# Patient Record
Sex: Female | Born: 1955 | Race: White | Hispanic: No | Marital: Married | Smoking: Current every day smoker
Health system: Southern US, Community
[De-identification: ages and names within clinical notes are randomized; demographics above are authoritative.]

## PROBLEM LIST (undated history)

## (undated) DIAGNOSIS — F419 Anxiety disorder, unspecified: Secondary | ICD-10-CM

## (undated) DIAGNOSIS — C50919 Malignant neoplasm of unspecified site of unspecified female breast: Secondary | ICD-10-CM

## (undated) HISTORY — PX: KIDNEY SURGERY: SHX687

## (undated) HISTORY — PX: FOOT SURGERY: SHX648

## (undated) HISTORY — PX: ABDOMINAL HYSTERECTOMY: SHX81

## (undated) HISTORY — PX: TONSILLECTOMY: SUR1361

---

## 1998-08-11 ENCOUNTER — Emergency Department (HOSPITAL_COMMUNITY): Admission: EM | Admit: 1998-08-11 | Discharge: 1998-08-11 | Payer: Self-pay | Admitting: Emergency Medicine

## 2000-12-22 HISTORY — PX: BREAST BIOPSY: SHX20

## 2000-12-22 HISTORY — PX: BREAST LUMPECTOMY: SHX2

## 2004-10-24 ENCOUNTER — Ambulatory Visit: Payer: Self-pay | Admitting: Oncology

## 2004-11-20 ENCOUNTER — Ambulatory Visit: Payer: Self-pay

## 2004-11-21 ENCOUNTER — Ambulatory Visit: Payer: Self-pay | Admitting: Oncology

## 2004-12-22 HISTORY — PX: BREAST BIOPSY: SHX20

## 2005-04-30 ENCOUNTER — Ambulatory Visit: Payer: Self-pay | Admitting: Oncology

## 2005-05-22 ENCOUNTER — Ambulatory Visit: Payer: Self-pay | Admitting: Oncology

## 2005-06-25 ENCOUNTER — Ambulatory Visit: Payer: Self-pay | Admitting: Unknown Physician Specialty

## 2005-06-27 ENCOUNTER — Ambulatory Visit: Payer: Self-pay

## 2005-07-02 ENCOUNTER — Ambulatory Visit: Payer: Self-pay | Admitting: Surgery

## 2005-07-03 ENCOUNTER — Ambulatory Visit: Payer: Self-pay | Admitting: Oncology

## 2005-08-04 ENCOUNTER — Ambulatory Visit (HOSPITAL_COMMUNITY): Admission: RE | Admit: 2005-08-04 | Discharge: 2005-08-04 | Payer: Self-pay | Admitting: *Deleted

## 2005-08-06 ENCOUNTER — Ambulatory Visit: Payer: Self-pay

## 2005-08-08 ENCOUNTER — Encounter: Admission: RE | Admit: 2005-08-08 | Discharge: 2005-08-08 | Payer: Self-pay | Admitting: *Deleted

## 2005-08-18 ENCOUNTER — Encounter: Admission: RE | Admit: 2005-08-18 | Discharge: 2005-08-18 | Payer: Self-pay | Admitting: *Deleted

## 2006-04-22 ENCOUNTER — Encounter: Payer: Self-pay | Admitting: Emergency Medicine

## 2007-03-18 IMAGING — US ULTRASOUND RIGHT BREAST
1 series · 17 of 25 positions shown · non-contrast
Comparison: none

REASON FOR EXAM: density  [REDACTED]   Per Dr. Thobo Dikgong request saw patient on
7-10  SEND REPORT TO...
COMMENTS:

[Series 1: ultrasound right breast · 17 of 27 slices shown]
[im 1/27]
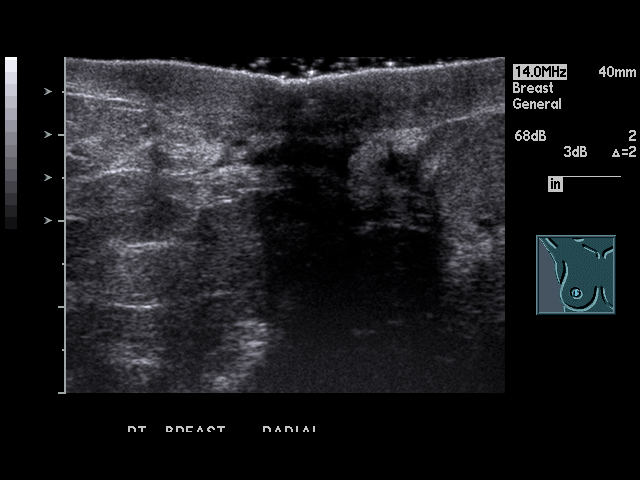
[im 3/27]
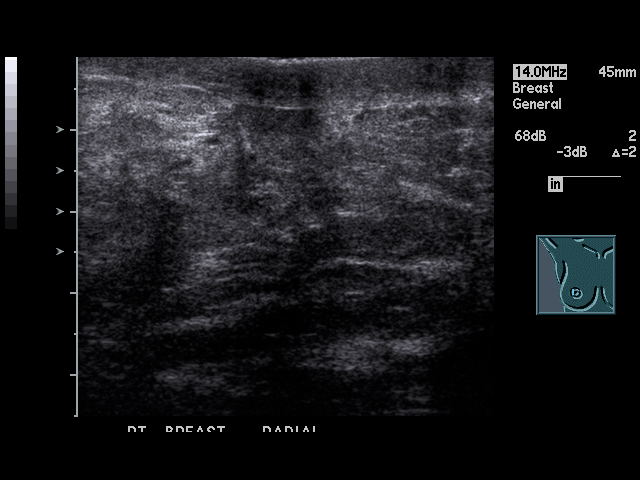
[im 4/27]
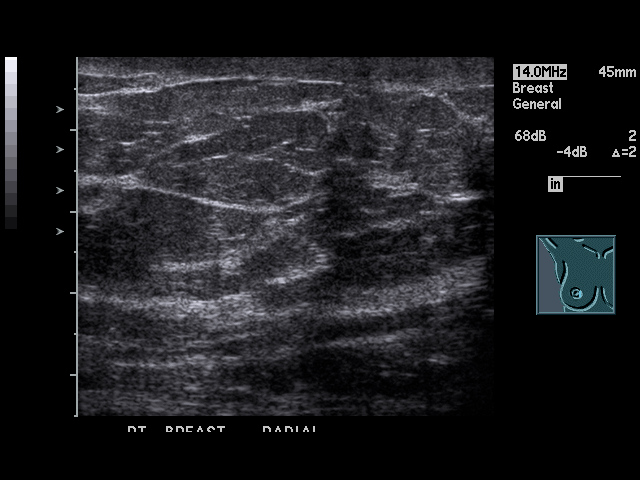
[im 6/27]
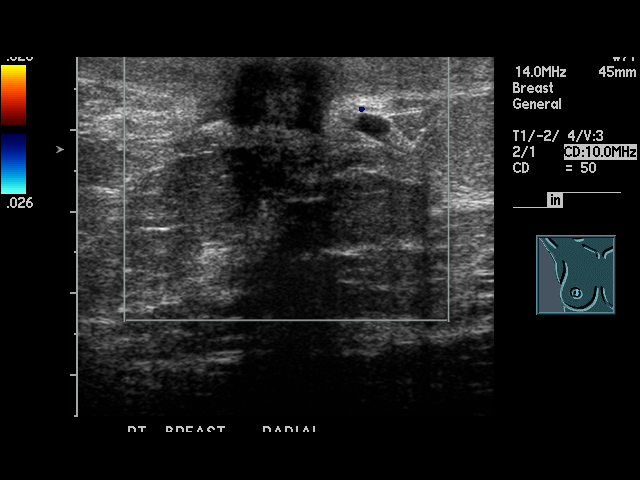
[im 7/27]
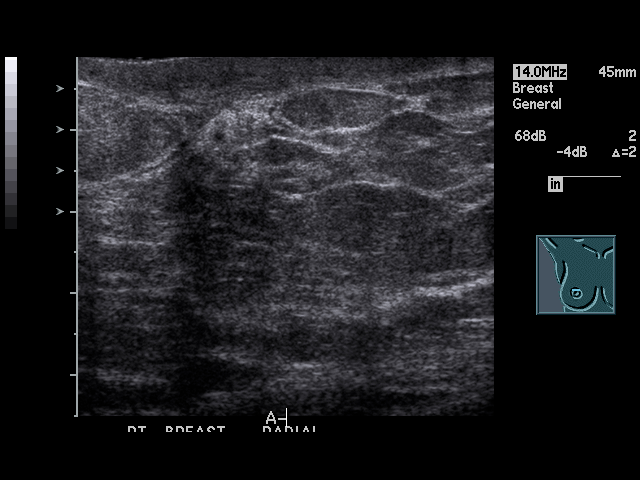
[im 9/27]
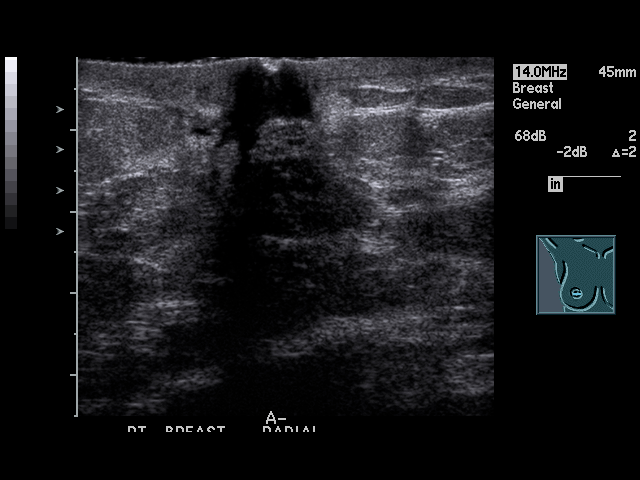
[im 10/27]
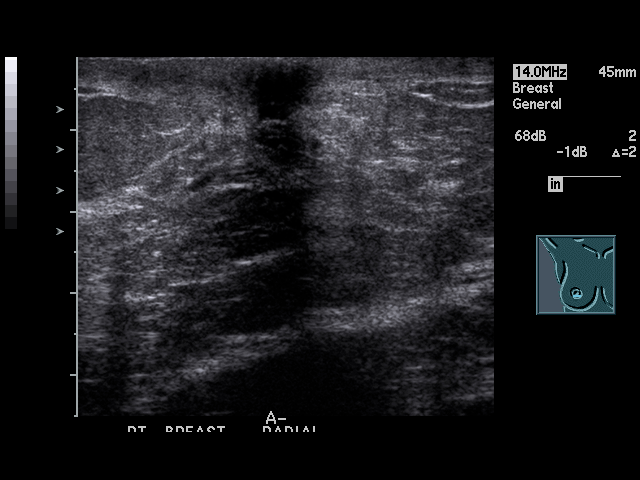
[im 12/27]
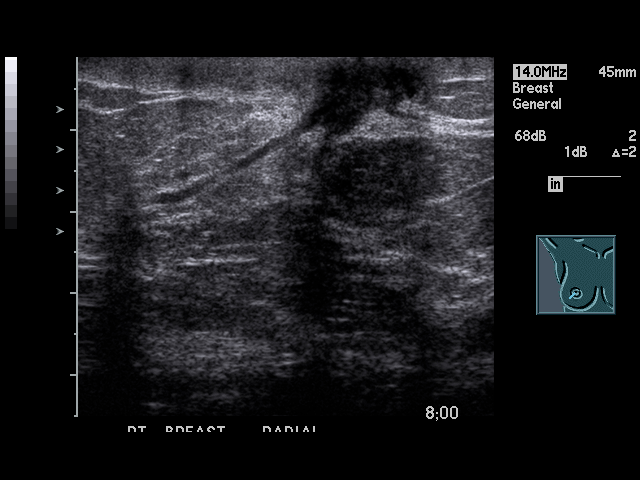
[im 14/27]
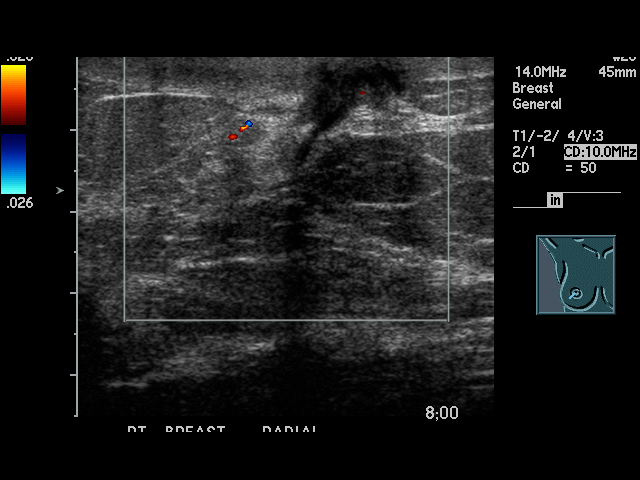
[im 15/27]
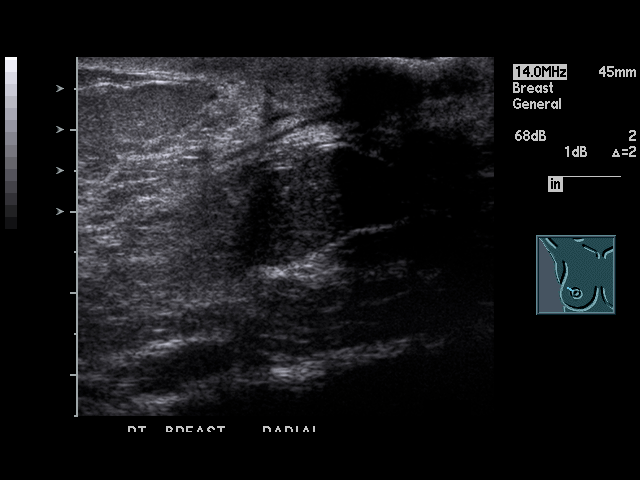
[im 17/27]
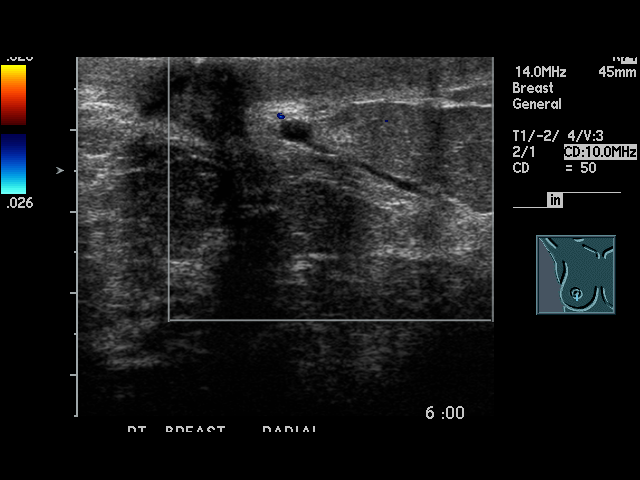
[im 18/27]
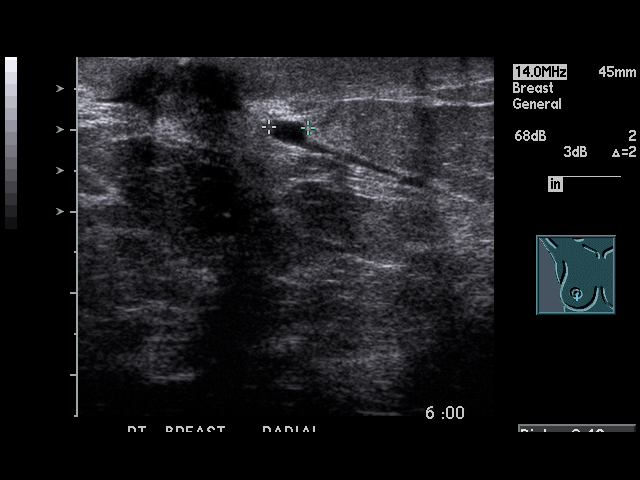
[im 20/27]
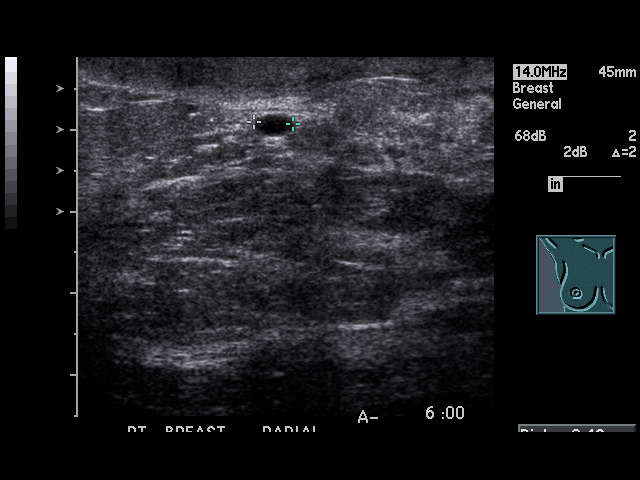
[im 21/27]
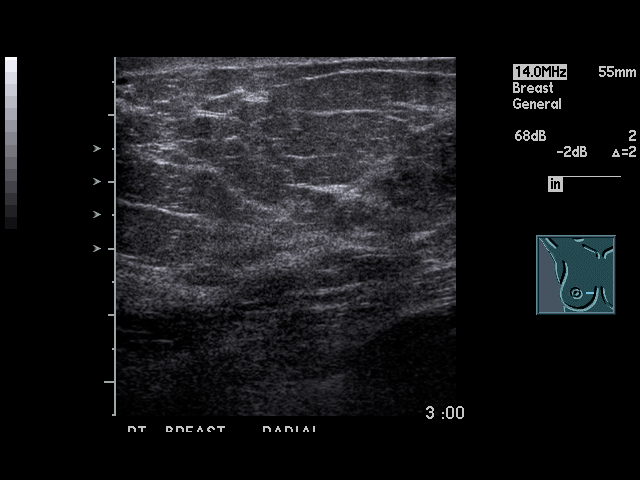
[im 23/27]
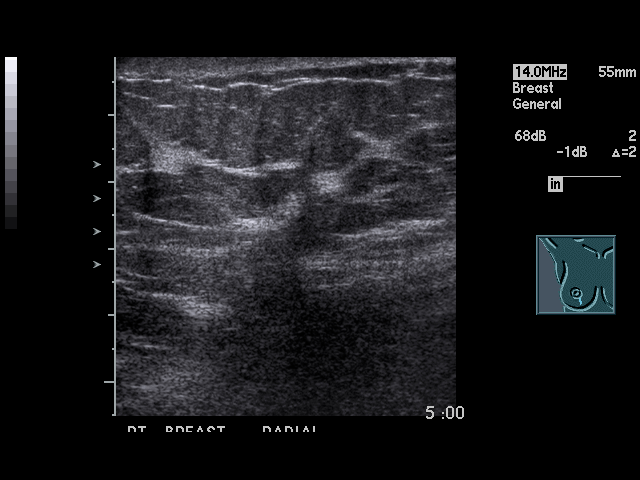
[im 24/27]
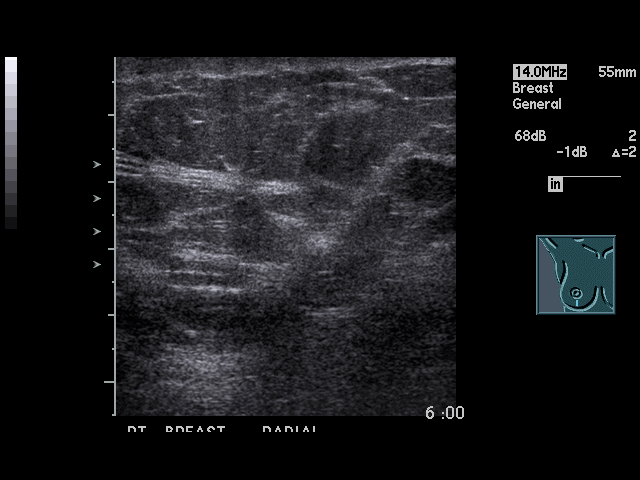
[im 27/27]
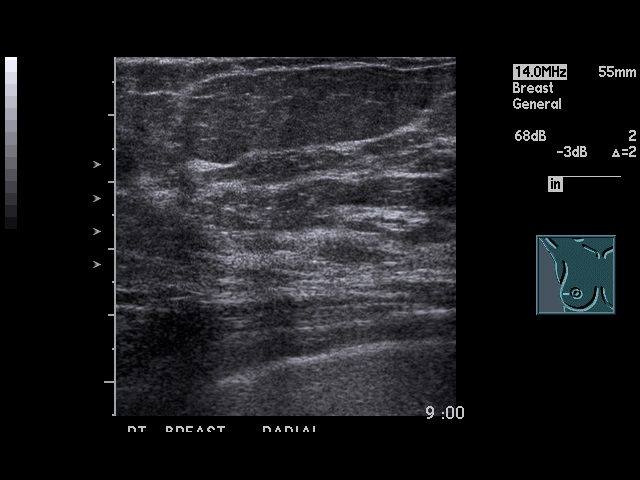

[17 of 25 positions shown; findings below may reference images not displayed]

PROCEDURE:     US  - US BREAST RIGHT  - July 02, 2005  [DATE]

RESULT:        Evaluation of the RIGHT breast in the areolar region
demonstrates multiple dilated ducts extending to the region of the nipple.
A focal region of cystic-like ductal dilatation is appreciated at the 6
o'clock position of the nipple measuring approximately .5 cm in diameter.
No definitive sonographic masses nor nodules are identified.  Considering
the reported patient's history of peau d'[REDACTED] skin in the nipple area and
retraction of the nipple.   Further evaluation with surgical consultation is
recommended.
IMPRESSION: 1.     Suspicious findings within the areolar region of the RIGHT breast.
Considering the patient's history, surgical consultation is recommended.
2.     BI-RADS: Category 4-Suspicious Abnormality.
3.     No definitive masses or nodules are appreciated though dilated ducts
are identified extending to the nipple region.

## 2009-02-07 ENCOUNTER — Ambulatory Visit: Payer: Self-pay

## 2012-01-27 ENCOUNTER — Ambulatory Visit: Payer: Self-pay

## 2013-07-15 DIAGNOSIS — G5750 Tarsal tunnel syndrome, unspecified lower limb: Secondary | ICD-10-CM | POA: Insufficient documentation

## 2013-07-15 DIAGNOSIS — M2060 Acquired deformities of toe(s), unspecified, unspecified foot: Secondary | ICD-10-CM | POA: Insufficient documentation

## 2013-10-12 ENCOUNTER — Ambulatory Visit: Payer: Self-pay | Admitting: Oncology

## 2013-10-14 DIAGNOSIS — Z720 Tobacco use: Secondary | ICD-10-CM | POA: Insufficient documentation

## 2013-10-14 DIAGNOSIS — F17210 Nicotine dependence, cigarettes, uncomplicated: Secondary | ICD-10-CM | POA: Insufficient documentation

## 2013-10-25 ENCOUNTER — Ambulatory Visit: Payer: Self-pay

## 2013-12-17 ENCOUNTER — Emergency Department: Payer: Self-pay | Admitting: Emergency Medicine

## 2013-12-17 LAB — CBC WITH DIFFERENTIAL/PLATELET
Eosinophil #: 0 10*3/uL (ref 0.0–0.7)
Eosinophil %: 0.7 %
HCT: 42.2 % (ref 35.0–47.0)
HGB: 14.1 g/dL (ref 12.0–16.0)
Lymphocyte #: 1.4 10*3/uL (ref 1.0–3.6)
MCH: 32.2 pg (ref 26.0–34.0)
MCHC: 33.5 g/dL (ref 32.0–36.0)
Monocyte #: 0.3 x10 3/mm (ref 0.2–0.9)
Monocyte %: 4.6 %
Neutrophil %: 69.7 %
WBC: 5.6 10*3/uL (ref 3.6–11.0)

## 2013-12-17 LAB — BASIC METABOLIC PANEL
Anion Gap: 5 — ABNORMAL LOW (ref 7–16)
BUN: 11 mg/dL (ref 7–18)
Chloride: 105 mmol/L (ref 98–107)
Co2: 29 mmol/L (ref 21–32)
Creatinine: 0.88 mg/dL (ref 0.60–1.30)
EGFR (African American): >60
Glucose: 128 mg/dL — ABNORMAL HIGH (ref 65–99)
Potassium: 3.8 mmol/L (ref 3.5–5.1)
Sodium: 139 mmol/L (ref 136–145)

## 2014-04-12 ENCOUNTER — Emergency Department: Payer: Self-pay | Admitting: Internal Medicine

## 2014-07-14 ENCOUNTER — Ambulatory Visit: Payer: Self-pay | Admitting: Podiatry

## 2014-07-18 ENCOUNTER — Encounter: Payer: Self-pay | Admitting: Podiatry

## 2014-07-18 ENCOUNTER — Ambulatory Visit (INDEPENDENT_AMBULATORY_CARE_PROVIDER_SITE_OTHER): Payer: Medicaid Other

## 2014-07-18 ENCOUNTER — Ambulatory Visit (INDEPENDENT_AMBULATORY_CARE_PROVIDER_SITE_OTHER): Payer: Medicaid Other | Admitting: Podiatry

## 2014-07-18 VITALS — BP 146/86 | HR 68 | Resp 12

## 2014-07-18 DIAGNOSIS — R52 Pain, unspecified: Secondary | ICD-10-CM

## 2014-07-18 DIAGNOSIS — G90529 Complex regional pain syndrome I of unspecified lower limb: Secondary | ICD-10-CM

## 2014-07-18 DIAGNOSIS — M779 Enthesopathy, unspecified: Secondary | ICD-10-CM

## 2014-07-18 DIAGNOSIS — M2040 Other hammer toe(s) (acquired), unspecified foot: Secondary | ICD-10-CM

## 2014-07-18 DIAGNOSIS — M7750 Other enthesopathy of unspecified foot: Secondary | ICD-10-CM

## 2014-07-18 NOTE — Progress Notes (Signed)
Subjective:     Patient ID: Hailey Patterson, female   DOB: [birth year], 58 y.o.   MRN: [id number]  Foot Pain  Toe Pain    patient presents stating she had surgery last November and that she's had trouble with her foot ever since left and is changing colors and swelling and very painful and that she has trouble with her right toes   Review of Systems  All other systems reviewed and are negative.      Objective:   Physical Exam  Nursing note and vitals reviewed. Constitutional: She is oriented to person, place, and time.  Cardiovascular: Intact distal pulses.   Musculoskeletal: Normal range of motion.  Neurological: She is oriented to person, place, and time.  Skin: Skin is warm.   neurovascular status intact with muscle strength adequate and range of motion of the subtalar and midtarsal joint within normal limits. Patient is splinting in the forefoot left and there is incision sites on the dorsum left and there is severe pain in the metatarsophalangeal joints with clonus to this part of her foot and some swelling. I do not note any mottled skin formation. The right foot shows mild contraction of the digits mild discomfort but not intense     Assessment:     Very strong possibility for chronic pain syndrome or RSD S.-like symptoms left foot secondary to surgery of 8 months ago    Plan:     H&P and x-rays reviewed and I gave all instructions to what's possible to the patient and she is to return to her primary physician as I do believe this will need to be treated at a medical institution. I wrote down for her information she's making appointment with her surgeon who did her surgery to make months ago

## 2014-07-18 NOTE — Progress Notes (Signed)
   Subjective:    Patient ID: Hailey Patterson, female    DOB: [birth year], 58 y.o.   MRN: [id number]  HPI PT STATED LT FOOT HAVE SURGERY DONE 8 MONTHS AGO AND STILL HAVING PAIN/BURNING. THE FOOT IS GETTING WORSE. ALSO RT FOOT TOES ARE CURVING IN AND IS GETTING WORSE. RT FOOT TOES GETTING AGGRAVATED BY WEARING CERTAIN SHOES. BOTH FEET TRIED TO TAKE TYLENOL BUT NO HELP.   Review of Systems  Eyes: Positive for redness and visual disturbance.  Musculoskeletal: Positive for back pain, gait problem and joint swelling.       Objective:   Physical Exam        Assessment & Plan:

## 2014-07-23 ENCOUNTER — Emergency Department: Payer: Self-pay | Admitting: Emergency Medicine

## 2014-07-23 LAB — CBC
HCT: 43.3 % (ref 35.0–47.0)
HGB: 14.4 g/dL (ref 12.0–16.0)
MCH: 32.2 pg (ref 26.0–34.0)
MCHC: 33.3 g/dL (ref 32.0–36.0)
MCV: 97 fL (ref 80–100)
PLATELETS: 199 10*3/uL (ref 150–440)
RBC: 4.47 10*6/uL (ref 3.80–5.20)
RDW: 14 % (ref 11.5–14.5)
WBC: 6.6 10*3/uL (ref 3.6–11.0)

## 2014-07-23 LAB — BASIC METABOLIC PANEL
Anion Gap: 6 — ABNORMAL LOW (ref 7–16)
BUN: 12 mg/dL (ref 7–18)
Calcium, Total: 8.9 mg/dL (ref 8.5–10.1)
Chloride: 108 mmol/L — ABNORMAL HIGH (ref 98–107)
Co2: 26 mmol/L (ref 21–32)
Creatinine: 1.02 mg/dL (ref 0.60–1.30)
EGFR (Non-African Amer.): 60 — ABNORMAL LOW
Glucose: 101 mg/dL — ABNORMAL HIGH (ref 65–99)
Osmolality: 279 (ref 275–301)
POTASSIUM: 3.9 mmol/L (ref 3.5–5.1)
SODIUM: 140 mmol/L (ref 136–145)

## 2014-07-23 LAB — TROPONIN I: Troponin-I: <0.02 ng/mL

## 2014-07-25 ENCOUNTER — Encounter (HOSPITAL_COMMUNITY): Payer: Self-pay | Admitting: Emergency Medicine

## 2014-07-25 ENCOUNTER — Other Ambulatory Visit: Payer: Self-pay

## 2014-07-25 DIAGNOSIS — Z853 Personal history of malignant neoplasm of breast: Secondary | ICD-10-CM | POA: Insufficient documentation

## 2014-07-25 DIAGNOSIS — R079 Chest pain, unspecified: Secondary | ICD-10-CM | POA: Insufficient documentation

## 2014-07-25 DIAGNOSIS — M79609 Pain in unspecified limb: Secondary | ICD-10-CM | POA: Diagnosis present

## 2014-07-25 DIAGNOSIS — Z90710 Acquired absence of both cervix and uterus: Secondary | ICD-10-CM | POA: Diagnosis not present

## 2014-07-25 DIAGNOSIS — R0789 Other chest pain: Secondary | ICD-10-CM | POA: Diagnosis not present

## 2014-07-25 DIAGNOSIS — Z79899 Other long term (current) drug therapy: Secondary | ICD-10-CM | POA: Insufficient documentation

## 2014-07-25 DIAGNOSIS — M7989 Other specified soft tissue disorders: Secondary | ICD-10-CM | POA: Diagnosis not present

## 2014-07-25 DIAGNOSIS — F17200 Nicotine dependence, unspecified, uncomplicated: Secondary | ICD-10-CM | POA: Diagnosis not present

## 2014-07-25 NOTE — ED Notes (Signed)
Pt states pain that is majority on her right arm wit swelling, pt states that the pain sometimes feels like numbness and it sometimes hurts due to it being swollen. Pt states that her chest also hurts at times but majority of the pain is in her right arm.

## 2014-07-26 ENCOUNTER — Ambulatory Visit (HOSPITAL_COMMUNITY)
Admission: RE | Admit: 2014-07-26 | Discharge: 2014-07-26 | Disposition: A | Discharge status: Home / Self Care | Payer: Medicaid Other | Source: Ambulatory Visit

## 2014-07-26 ENCOUNTER — Emergency Department (HOSPITAL_COMMUNITY)
Admission: EM | Admit: 2014-07-26 | Discharge: 2014-07-26 | Disposition: A | Discharge status: Home / Self Care | Payer: Medicaid Other | Attending: Emergency Medicine | Admitting: Emergency Medicine

## 2014-07-26 ENCOUNTER — Emergency Department (HOSPITAL_COMMUNITY): Payer: Medicaid Other

## 2014-07-26 DIAGNOSIS — M7989 Other specified soft tissue disorders: Secondary | ICD-10-CM

## 2014-07-26 DIAGNOSIS — R079 Chest pain, unspecified: Secondary | ICD-10-CM

## 2014-07-26 DIAGNOSIS — M79609 Pain in unspecified limb: Secondary | ICD-10-CM

## 2014-07-26 HISTORY — DX: Malignant neoplasm of unspecified site of unspecified female breast: C50.919

## 2014-07-26 LAB — I-STAT CHEM 8, ED
BUN: 10 mg/dL (ref 6–23)
CHLORIDE: 104 meq/L (ref 96–112)
Calcium, Ion: 1.17 mmol/L (ref 1.13–1.30)
Creatinine, Ser: 1.2 mg/dL — ABNORMAL HIGH (ref 0.50–1.10)
GLUCOSE: 94 mg/dL (ref 70–99)
HCT: 42 % (ref 36.0–46.0)
Hemoglobin: 14.3 g/dL (ref 12.0–15.0)
Potassium: 3.7 mEq/L (ref 3.7–5.3)
SODIUM: 142 meq/L (ref 137–147)
TCO2: 27 mmol/L (ref 0–100)

## 2014-07-26 LAB — CBC WITH DIFFERENTIAL/PLATELET
Basophils Absolute: 0 10*3/uL (ref 0.0–0.1)
Basophils Relative: 0 % (ref 0–1)
Eosinophils Absolute: 0.2 10*3/uL (ref 0.0–0.7)
Eosinophils Relative: 2 % (ref 0–5)
HCT: 43.5 % (ref 36.0–46.0)
HEMOGLOBIN: 14.8 g/dL (ref 12.0–15.0)
LYMPHS ABS: 3 10*3/uL (ref 0.7–4.0)
Lymphocytes Relative: 47 % — ABNORMAL HIGH (ref 12–46)
MCH: 32.9 pg (ref 26.0–34.0)
MCHC: 34 g/dL (ref 30.0–36.0)
MCV: 96.7 fL (ref 78.0–100.0)
MONOS PCT: 8 % (ref 3–12)
Monocytes Absolute: 0.5 10*3/uL (ref 0.1–1.0)
NEUTROS ABS: 2.8 10*3/uL (ref 1.7–7.7)
Neutrophils Relative %: 43 % (ref 43–77)
PLATELETS: 212 10*3/uL (ref 150–400)
RBC: 4.5 MIL/uL (ref 3.87–5.11)
RDW: 13.5 % (ref 11.5–15.5)
WBC: 6.4 10*3/uL (ref 4.0–10.5)

## 2014-07-26 LAB — I-STAT TROPONIN, ED: TROPONIN I, POC: 0 ng/mL (ref 0.00–0.08)

## 2014-07-26 LAB — D-DIMER, QUANTITATIVE (NOT AT ARMC): D DIMER QUANT: 0.61 ug{FEU}/mL — AB (ref 0.00–0.48)

## 2014-07-26 MED ORDER — ENOXAPARIN SODIUM 80 MG/0.8ML ~~LOC~~ SOLN: 65.0000 mg | Freq: Two times a day (BID) | SUBCUTANEOUS | Status: DC

## 2014-07-26 MED ORDER — ENOXAPARIN SODIUM 80 MG/0.8ML ~~LOC~~ SOLN
65.0000 mg | Freq: Once | SUBCUTANEOUS | Status: AC
  Administered 2014-07-26: 65 mg via SUBCUTANEOUS
  Filled 2014-07-26: qty 0.8

## 2014-07-26 NOTE — Progress Notes (Signed)
Right upper extremity venous duplex completed:  No evidence of DVT or superficial thrombosis.    

## 2014-07-26 NOTE — ED Notes (Signed)
Patient transported to X-ray 

## 2014-07-26 NOTE — Progress Notes (Signed)
ANTICOAGULATION CONSULT NOTE - Initial Consult  Pharmacy Consult for Lovenox  Indication: r/o VTE  Allergies  Allergen Reactions  . Codeine Nausea And Vomiting  . Meperidine Nausea And Vomiting  . Morphine Nausea And Vomiting    Patient Measurements: ~64 kg per pt  Vital Signs: Temp: 97.9 F (36.6 C) (08/04 2135) Temp src: Oral (08/04 2135) BP: 141/84 mmHg (08/05 0143) Pulse Rate: 75 (08/04 2336)  Labs:  Recent Labs  07/26/14 0052 07/26/14 0255  HGB 14.8 14.3  HCT 43.5 42.0  PLT 212  --   CREATININE  --  1.20*    CrCl is unknown because there is no height on file for the current visit.   Medical History: Past Medical History  Diagnosis Date  . Breast cancer     Assessment: 58 y/o with arm swelling, intermittent chest pain, CBC good, renal function ok, to start Lovenox for r/o VTE.   Goal of Therapy:  Monitor platelets by anticoagulation protocol: Yes   Plan:  -Lovenox 1 mg/kg subcutaneous q12h -Minimum q72h CBC while on Lovenox  -Monitor for bleeding -F/U VTE w/u  Narda Bonds 07/26/2014,2:59 AM

## 2014-07-26 NOTE — Discharge Instructions (Signed)
Return in the morning to have an ultrasound of your right arm completed. Recommend you followup with your primary care doctor. Continue with your stress test as recommended by [facility]. Return to the emergency department as needed if symptoms worsen.  Muscle Pain Muscle pain (myalgia) may be caused by many things, including:  Overuse or muscle strain, especially if you are not in shape. This is the most common cause of muscle pain.  Injury.  Bruises.  Viruses, such as the flu.  Infectious diseases.  Fibromyalgia, which is a chronic condition that causes muscle tenderness, fatigue, and headache.  Autoimmune diseases, including lupus.  Certain drugs, including ACE inhibitors and statins. Muscle pain may be mild or severe. In most cases, the pain lasts only a short time and goes away without treatment. To diagnose the cause of your muscle pain, your health care provider will take your medical history. This means he or she will ask you when your muscle pain began and what has been happening. If you have not had muscle pain for very long, your health care provider may want to wait before doing much testing. If your muscle pain has lasted a long time, your health care provider may want to run tests right away. If your health care provider thinks your muscle pain may be caused by illness, you may need to have additional tests to rule out certain conditions.  Treatment for muscle pain depends on the cause. Home care is often enough to relieve muscle pain. Your health care provider may also prescribe anti-inflammatory medicine. HOME CARE INSTRUCTIONS Watch your condition for any changes. The following actions may help to lessen any discomfort you are feeling:  Only take over-the-counter or prescription medicines as directed by your health care provider.  Apply ice to the sore muscle:  Put ice in a plastic bag.  Place a towel between your skin and the bag.  Leave the ice on for 15-20 minutes, 3-4  times a day.  You may alternate applying hot and cold packs to the muscle as directed by your health care provider.  If overuse is causing your muscle pain, slow down your activities until the pain goes away.  Remember that it is normal to feel some muscle pain after starting a workout program. Muscles that have not been used often will be sore at first.  Do regular, gentle exercises if you are not usually active.  Warm up before exercising to lower your risk of muscle pain.  Do not continue working out if the pain is very bad. Bad pain could mean you have injured a muscle. SEEK MEDICAL CARE IF:  Your muscle pain gets worse, and medicines do not help.  You have muscle pain that lasts longer than 3 days.  You have a rash or fever along with muscle pain.  You have muscle pain after a tick bite.  You have muscle pain while working out, even though you are in good physical condition.  You have redness, soreness, or swelling along with muscle pain.  You have muscle pain after starting a new medicine or changing the dose of a medicine. SEEK IMMEDIATE MEDICAL CARE IF:  You have trouble breathing.  You have trouble swallowing.  You have muscle pain along with a stiff neck, fever, and vomiting.  You have severe muscle weakness or cannot move part of your body. MAKE SURE YOU:   Understand these instructions.  Will watch your condition.  Will get help right away if you are not  doing well or get worse. Document Released: 10/30/2006 Document Revised: 12/13/2013 Document Reviewed: 10/04/2013 [facility] Patient Information 2015 [place], [place]. This information is not intended to replace advice given to you by your health care provider. Make sure you discuss any questions you have with your health care provider.

## 2014-08-08 NOTE — ED Provider Notes (Signed)
CSN: [id number]     Arrival date & time 07/25/14  2126 History   First MD Initiated Contact with Patient 07/26/14 0111     Chief Complaint  Patient presents with  . Arm Pain    (Consider location/radiation/quality/duration/timing/severity/associated sxs/prior Treatment) HPI Comments: Patient is a 58 year old female with a history of breast cancer who presents to the emergency department for swelling of her proximal right arm. Patient states that she feels as though her arm appear swollen on the volar aspect of her right arm, just proximal to her elbow. Patient states that swelling is associated with an aching discomfort. She states that sometimes she notices associated numbness in her right arm. Sometimes the pain radiates to her right chest. She states that the swelling improves spontaneously when upright. Patient denies fever, syncope, shortness of breath, nausea, vomiting, extremity weakness, and trauma or injury. Patient denies history of blood clots. She is cancer free. No personal hx of ACS.  Patient was evaluated at [facility] by cardiology for this complaint today at which time she was informed to have an outpatient stress test completed.  Patient is a 58 y.o. female presenting with arm pain. The history is provided by the patient. No language interpreter was used.  Arm Pain Associated symptoms include arthralgias, chest pain, joint swelling and numbness. Pertinent negatives include no fever, nausea or vomiting.    Past Medical History  Diagnosis Date  . Breast cancer    Past Surgical History  Procedure Laterality Date  . Abdominal hysterectomy    . Kidney surgery    . Tonsillectomy    . Cesarean section    . Foot surgery      bilateral feet   History reviewed. No pertinent family history. History  Substance Use Topics  . Smoking status: Light Tobacco Smoker  . Smokeless tobacco: Not on file  . Alcohol Use: No   OB History   Grav Para Term Preterm Abortions TAB SAB Ect Mult  Living                  Review of Systems  Constitutional: Negative for fever.  Respiratory: Negative for shortness of breath.   Cardiovascular: Positive for chest pain.  Gastrointestinal: Negative for nausea and vomiting.  Musculoskeletal: Positive for arthralgias and joint swelling.  Neurological: Positive for numbness. Negative for syncope.  All other systems reviewed and are negative.    Allergies  Codeine; Meperidine; and Morphine  Home Medications   Prior to Admission medications   Medication Sig Start Date End Date Taking? Authorizing Provider  citalopram (CELEXA) 20 MG tablet Take 20 mg by mouth daily.  01/31/13  Yes Historical Provider, MD   BP 141/84  Pulse 75  Temp(Src) 97.9 F (36.6 C) (Oral)  Resp 12  SpO2 100%  Physical Exam  Nursing note and vitals reviewed. Constitutional: She is oriented to person, place, and time. She appears well-developed and well-nourished. No distress.  Nontoxic/nonseptic appearing  HENT:  Head: Normocephalic and atraumatic.  Eyes: Conjunctivae and EOM are normal. No scleral icterus.  Neck: Normal range of motion.  No JVD  Cardiovascular: Normal rate, regular rhythm and intact distal pulses.   Distal radial pulse 2+ in RUE  Pulmonary/Chest: Effort normal. No respiratory distress. She has no wheezes. She has no rales.   No tachypnea or dyspnea. Chest expansion symmetric.  Musculoskeletal: Normal range of motion.  Normal ROM of RUE. No appreciable significant swelling to RUE soft tissue. No erythema, heat to touch, or red streaking.  Neurological: She is alert and oriented to person, place, and time. She exhibits normal muscle tone. Coordination normal.  GCS 15. Speech is goal oriented. No gross sensory deficits appreciated. Strength in upper extremities 5/5 bilaterally.  Skin: Skin is warm and dry. No rash noted. She is not diaphoretic. No erythema. No pallor.  Psychiatric: She has a normal mood and affect. Her behavior is normal.     ED Course  Procedures (including critical care time) Labs Review Labs Reviewed  CBC WITH DIFFERENTIAL - Abnormal; Notable for the following:    Lymphocytes Relative 47 (*)    All other components within normal limits  D-DIMER, QUANTITATIVE - Abnormal; Notable for the following:    D-Dimer, Quant 0.61 (*)    All other components within normal limits  I-STAT CHEM 8, ED - Abnormal; Notable for the following:    Creatinine, Ser 1.20 (*)    All other components within normal limits  I-STAT TROPOININ, ED   Imaging Review Dg Chest 2 View  07/26/2014   CLINICAL DATA:  Chest and right arm pain  EXAM: CHEST  2 VIEW  COMPARISON:  07/23/2014  FINDINGS: Heart size and vascularity are normal. Lungs are clear without infiltrate or effusion. Chronic right rib fractures.  IMPRESSION: No active cardiopulmonary disease.   Electronically Signed   By: Franchot Gallo M.D.   On: 07/26/2014 01:46   Dg Humerus Right  07/26/2014   CLINICAL DATA:  Arm pain  EXAM: RIGHT HUMERUS - 2+ VIEW  COMPARISON:  None.  FINDINGS: There is no evidence of fracture or other focal bone lesions. Soft tissues are unremarkable.  IMPRESSION: Negative.   Electronically Signed   By: Franchot Gallo M.D.   On: 07/26/2014 01:47     EKG Interpretation   Date/Time:  Wednesday July 26 2014 01:02:50 EDT Ventricular Rate:  63 PR Interval:  171 QRS Duration: 83 QT Interval:  420 QTC Calculation: 430 R Axis:   51 Text Interpretation:  Sinus rhythm Confirmed by [facility]  MD, APRIL  ([id number]) on 07/26/2014 2:14:51 AM      MDM   Final diagnoses:  Swelling of arm  Right-sided chest pain    58 year old female presents to the emergency department for further evaluation of subjective right arm swelling and pain. Patient well and nontoxic appearing, hemodynamically stable, and afebrile. Cardiac workup completed today which is negative. Patient denies a hx of ACS and symptoms fairly atypical for this. Also doubt dissection; no  mediastinal widening on CXR and VSS. Patient evaluated by [facility] cardiology today for symptoms. Cardiology deemed patient fit for outpatient stress test for further work up. No evidence of cellulitis or soft tissue skin infection. No marked swelling appreciated to RUE. No pitting edema. Doubt blood clot, however, unable to definitively exclude this diagnosis. D dimer is mildly elevated. Plan includes tx with Lovenox with instruction to return for venous duplex. Patient stable for d/c. Do not believe further emergent workup is indicated. Return precautions discussed and provided. Patient agreeable to plan with no unaddressed concerns.   Filed Vitals:   07/25/14 2135 07/25/14 2336 07/26/14 0143  BP: 138/91 133/85 141/84  Pulse: 79 75   Temp: 97.9 F (36.6 C)    TempSrc: Oral    Resp: 22 22 12   SpO2: 100% 98% 100%     Antonietta Breach, PA-C 08/08/14 2042

## 2014-08-16 NOTE — ED Provider Notes (Signed)
Medical screening examination/treatment/procedure(s) were performed by non-physician practitioner and as supervising physician I was immediately available for consultation/collaboration.   EKG Interpretation   Date/Time:  Wednesday July 26 2014 01:02:50 EDT Ventricular Rate:  63 PR Interval:  171 QRS Duration: 83 QT Interval:  420 QTC Calculation: 430 R Axis:   51 Text Interpretation:  Sinus rhythm Confirmed by [facility]  MD, Brihanna Devenport  ([id number]) on 07/26/2014 2:14:51 AM       Gretel Cantu Alfonso Patten, MD 08/16/14 [id number]

## 2014-08-25 ENCOUNTER — Ambulatory Visit: Payer: Self-pay | Admitting: Physician Assistant

## 2014-08-30 ENCOUNTER — Ambulatory Visit: Payer: Self-pay | Admitting: Physician Assistant

## 2014-09-07 ENCOUNTER — Ambulatory Visit: Payer: Self-pay | Admitting: Oncology

## 2014-09-07 DIAGNOSIS — H269 Unspecified cataract: Secondary | ICD-10-CM | POA: Insufficient documentation

## 2014-09-07 DIAGNOSIS — Z853 Personal history of malignant neoplasm of breast: Secondary | ICD-10-CM | POA: Insufficient documentation

## 2014-09-07 DIAGNOSIS — C50911 Malignant neoplasm of unspecified site of right female breast: Secondary | ICD-10-CM | POA: Insufficient documentation

## 2014-09-08 DIAGNOSIS — M21619 Bunion of unspecified foot: Secondary | ICD-10-CM | POA: Insufficient documentation

## 2014-10-16 ENCOUNTER — Ambulatory Visit: Payer: Self-pay | Admitting: Ophthalmology

## 2014-11-27 ENCOUNTER — Ambulatory Visit: Payer: Self-pay | Admitting: Ophthalmology

## 2015-02-22 DIAGNOSIS — Z91199 Patient's noncompliance with other medical treatment and regimen due to unspecified reason: Secondary | ICD-10-CM | POA: Insufficient documentation

## 2015-02-22 DIAGNOSIS — Z9119 Patient's noncompliance with other medical treatment and regimen: Secondary | ICD-10-CM | POA: Insufficient documentation

## 2015-04-06 DIAGNOSIS — L299 Pruritus, unspecified: Secondary | ICD-10-CM | POA: Insufficient documentation

## 2015-04-06 DIAGNOSIS — N6459 Other signs and symptoms in breast: Secondary | ICD-10-CM | POA: Insufficient documentation

## 2015-05-01 ENCOUNTER — Ambulatory Visit
Admission: RE | Admit: 2015-05-01 | Discharge: 2015-05-01 | Disposition: A | Discharge status: Home / Self Care | Payer: Medicaid Other | Source: Ambulatory Visit | Attending: Gastroenterology | Admitting: Gastroenterology

## 2015-05-01 ENCOUNTER — Ambulatory Visit: Payer: Medicaid Other | Admitting: Anesthesiology

## 2015-05-01 ENCOUNTER — Ambulatory Visit: Admission: RE | Admit: 2015-05-01 | Payer: Medicaid Other | Source: Ambulatory Visit | Admitting: Gastroenterology

## 2015-05-01 ENCOUNTER — Encounter
Admission: RE | Disposition: A | Discharge status: Home / Self Care | Payer: Self-pay | Source: Ambulatory Visit | Attending: Gastroenterology

## 2015-05-01 ENCOUNTER — Encounter: Admission: RE | Payer: Self-pay | Source: Ambulatory Visit

## 2015-05-01 ENCOUNTER — Encounter: Payer: Self-pay | Admitting: *Deleted

## 2015-05-01 DIAGNOSIS — Z885 Allergy status to narcotic agent status: Secondary | ICD-10-CM | POA: Diagnosis not present

## 2015-05-01 DIAGNOSIS — Z800 Family history of malignant neoplasm of digestive organs: Secondary | ICD-10-CM | POA: Insufficient documentation

## 2015-05-01 DIAGNOSIS — Z79899 Other long term (current) drug therapy: Secondary | ICD-10-CM | POA: Diagnosis not present

## 2015-05-01 DIAGNOSIS — Z853 Personal history of malignant neoplasm of breast: Secondary | ICD-10-CM | POA: Diagnosis not present

## 2015-05-01 DIAGNOSIS — Z90710 Acquired absence of both cervix and uterus: Secondary | ICD-10-CM | POA: Diagnosis not present

## 2015-05-01 DIAGNOSIS — K640 First degree hemorrhoids: Secondary | ICD-10-CM | POA: Insufficient documentation

## 2015-05-01 DIAGNOSIS — Z9889 Other specified postprocedural states: Secondary | ICD-10-CM | POA: Diagnosis not present

## 2015-05-01 DIAGNOSIS — F17200 Nicotine dependence, unspecified, uncomplicated: Secondary | ICD-10-CM | POA: Insufficient documentation

## 2015-05-01 DIAGNOSIS — Z1211 Encounter for screening for malignant neoplasm of colon: Secondary | ICD-10-CM | POA: Diagnosis not present

## 2015-05-01 HISTORY — PX: COLONOSCOPY: SHX5424

## 2015-05-01 SURGERY — COLONOSCOPY
Anesthesia: General

## 2015-05-01 MED ORDER — PROPOFOL 10 MG/ML IV BOLUS
INTRAVENOUS | Status: DC | PRN
  Administered 2015-05-01: 50 mg via INTRAVENOUS

## 2015-05-01 MED ORDER — PROPOFOL INFUSION 10 MG/ML OPTIME
INTRAVENOUS | Status: DC | PRN
  Administered 2015-05-01: 120 ug/kg/min via INTRAVENOUS

## 2015-05-01 MED ORDER — SODIUM CHLORIDE 0.9 % IV SOLN
INTRAVENOUS | Status: DC
Start: 2015-05-01 — End: 2015-05-01

## 2015-05-01 MED ORDER — FENTANYL CITRATE (PF) 100 MCG/2ML IJ SOLN
INTRAMUSCULAR | Status: DC | PRN
  Administered 2015-05-01: 50 ug via INTRAVENOUS

## 2015-05-01 MED ORDER — SODIUM CHLORIDE 0.9 % IV SOLN: INTRAVENOUS | Status: DC

## 2015-05-01 MED ORDER — MIDAZOLAM HCL 5 MG/5ML IJ SOLN
INTRAMUSCULAR | Status: DC | PRN
  Administered 2015-05-01: 1 mg via INTRAVENOUS

## 2015-05-01 MED ORDER — SODIUM CHLORIDE 0.9 % IV SOLN
INTRAVENOUS | Status: DC
  Administered 2015-05-01: 08:00:00 via INTRAVENOUS

## 2015-05-01 NOTE — Anesthesia Postprocedure Evaluation (Signed)
  Anesthesia Post-op Note  Patient: Hailey Patterson  Procedure(s) Performed: Procedure(s): COLONOSCOPY (N/A)  Anesthesia type:General  Patient location: PACU  Post pain: Pain level controlled  Post assessment: Post-op Vital signs reviewed, Patient's Cardiovascular Status Stable, Respiratory Function Stable, Patent Airway and No signs of Nausea or vomiting  Post vital signs: Reviewed and stable  Last Vitals:  Filed Vitals:   05/01/15 0930  BP: 96/82  Pulse: 67  Temp:   Resp: 15    Level of consciousness: awake, alert  and patient cooperative  Complications: No apparent anesthesia complications

## 2015-05-01 NOTE — Transfer of Care (Signed)
Immediate Anesthesia Transfer of Care Note  Patient: Hailey Patterson  Procedure(s) Performed: Procedure(s): COLONOSCOPY (N/A)  Patient Location: PACU  Anesthesia Type:General  Level of Consciousness: awake, alert  and oriented  Airway & Oxygen Therapy: Patient Spontanous Breathing and Patient connected to nasal cannula oxygen  Post-op Assessment: Report given to RN and Post -op Vital signs reviewed and stable  Post vital signs: Reviewed and stable  Last Vitals:  Filed Vitals:   05/01/15 0743  BP: 146/77  Pulse: 74  Temp: 36.1 C  Resp: 20    Complications: No apparent anesthesia complications

## 2015-05-01 NOTE — Op Note (Signed)
[facility] Gastroenterology Patient Name: Hailey Patterson Procedure Date: 05/01/2015 7:30 AM MRN: [id number] Account #: [id number] Date of Birth: 1956-08-30 Admit Type: Outpatient Age: 59 Room: [facility] ENDO ROOM 1 Gender: Female Note Status: Finalized Procedure:         Colonoscopy Indications:       Screening patient at increased risk: Family history of                     1st-degree relative with colorectal cancer at age 35 years                     (or older) Patient Profile:   This is a 59 year old female. Providers:         Gerrit Heck. Rayann Heman, MD Referring MD:      Rubbie Battiest Iona Beard, MD (Referring MD) Medicines:         Propofol per Anesthesia Complications:     No immediate complications. Procedure:         Pre-Anesthesia Assessment:                    - Prior to the procedure, a History and Physical was                     performed, and patient medications and allergies were                     reviewed. The patient is competent. The risks and benefits                     of the procedure and the sedation options and risks were                     discussed with the patient. All questions were answered                     and informed consent was obtained. Patient identification                     and proposed procedure were verified by the physician and                     the nurse in the pre-procedure area. Mental Status                     Examination: alert and oriented. Airway Examination:                     normal oropharyngeal airway and neck mobility. Respiratory                     Examination: clear to auscultation. CV Examination: RRR,                     no murmurs, no S3 or S4. Prophylactic Antibiotics: The                     patient does not require prophylactic antibiotics. Prior                     Anticoagulants: The patient has taken no previous                     anticoagulant or antiplatelet agents. ASA Grade  Assessment: II - A patient with mild systemic disease.                     After reviewing the risks and benefits, the patient was                     deemed in satisfactory condition to undergo the procedure.                     The anesthesia plan was to use monitored anesthesia care                     (MAC). Immediately prior to administration of medications,                     the patient was re-assessed for adequacy to receive                     sedatives. The heart rate, respiratory rate, oxygen                     saturations, blood pressure, adequacy of pulmonary                     ventilation, and response to care were monitored                     throughout the procedure. The physical status of the                     patient was re-assessed after the procedure.                    - Prior to the procedure, a History and Physical was                     performed, and patient medications, allergies and                     sensitivities were reviewed. The patient's tolerance of                     previous anesthesia was reviewed.                    After obtaining informed consent, the colonoscope was                     passed under direct vision. Throughout the procedure, the                     patient's blood pressure, pulse, and oxygen saturations                     were monitored continuously. The Colonoscope was                     introduced through the anus and advanced to the the                     terminal ileum. The colonoscopy was performed without                     difficulty. The patient tolerated the procedure well. The  quality of the bowel preparation was excellent. Findings:      The perianal and digital rectal examinations were normal.      A single medium-sized angioectasia without bleeding was found in the       cecum.      Internal hemorrhoids were found during retroflexion. The hemorrhoids       were Grade I (internal  hemorrhoids that do not prolapse).      The exam was otherwise without abnormality. Impression:        - A single non-bleeding colonic angioectasia.                    - Internal hemorrhoids.                    - The examination was otherwise normal.                    - No specimens collected. Recommendation:    - Observe patient in GI recovery unit.                    - Resume regular diet.                    - Continue present medications.                    - Repeat colonoscopy in 5 years for screening purposes.                    - Return to referring physician.                    - The findings and recommendations were discussed with the                     patient.                    - The findings and recommendations were discussed with the                     patient's family. Procedure Code(s): --- Professional ---                    [phone], Colonoscopy, flexible; diagnostic, including                     collection of specimen(s) by brushing or washing, when                     performed (separate procedure) CPT copyright 2014 [employer]. All rights reserved. The codes documented in this report are preliminary and upon coder review may  be revised to meet current compliance requirements. Mellody Life, MD 05/01/2015 8:54:11 AM This report has been signed electronically. Number of Addenda: 0 Note Initiated On: 05/01/2015 7:30 AM Scope Withdrawal Time: 0 hours 8 minutes 22 seconds  Total Procedure Duration: 0 hours 14 minutes 49 seconds       [facility]

## 2015-05-01 NOTE — Discharge Instructions (Signed)

## 2015-05-01 NOTE — Transfer of Care (Incomplete)
Immediate Anesthesia Transfer of Care Note  Patient: Hailey Patterson  Procedure(s) Performed: Procedure(s): COLONOSCOPY (N/A)  Patient Location: {PLACES; ANE POST:19477::"PACU"}  Anesthesia Type:{PROCEDURES; ANE POST ANESTHESIA TYPE:19480}  Level of Consciousness: {FINDINGS; ANE POST LEVEL OF CONSCIOUSNESS:19484}  Airway & Oxygen Therapy: {Exam; oxygen device:30095}  Post-op Assessment: {ASSESSMENT;POST-OP CHYIFO:27741}  Post vital signs: {DESC; ANE POST OINOMV:67209}  Last Vitals:  Filed Vitals:   05/01/15 0900  BP: 111/51  Pulse: 71  Temp: 36.6 C  Resp: 14    Complications: {FINDINGS; ANE POST COMPLICATIONS:19485}

## 2015-05-01 NOTE — H&P (Signed)
  Primary Care Physician:  Sharyne Peach, MD  Pre-Procedure History & Physical: HPI:  Hailey Patterson is a 59 y.o. female is here for an colonoscopy.   Past Medical History  Diagnosis Date  . Breast cancer     Past Surgical History  Procedure Laterality Date  . Abdominal hysterectomy    . Kidney surgery    . Tonsillectomy    . Cesarean section    . Foot surgery      bilateral feet    Prior to Admission medications   Medication Sig Start Date End Date Taking? Authorizing Provider  naproxen sodium (ANAPROX) 220 MG tablet Take 440 mg by mouth at bedtime as needed (pain).   Yes Historical Provider, MD    Allergies as of 04/12/2015 - Review Complete 07/26/2014  Allergen Reaction Noted  . Codeine Nausea And Vomiting 07/18/2014  . Meperidine Nausea And Vomiting 07/18/2014  . Morphine Nausea And Vomiting 07/18/2014    No family history on file.  History   Social History  . Marital Status: Married    Spouse Name: N/A  . Number of Children: N/A  . Years of Education: N/A   Occupational History  . Not on file.   Social History Main Topics  . Smoking status: Light Tobacco Smoker  . Smokeless tobacco: Not on file  . Alcohol Use: No  . Drug Use: Not on file  . Sexual Activity: Not Currently   Other Topics Concern  . Not on file   Social History Narrative     Physical Exam: BP 146/77 mmHg  Pulse 74  Temp(Src) 97 F (36.1 C) (Oral)  Resp 20  Ht 5\' 2"  (1.575 m)  Wt 150 lb (68.04 kg)  BMI 27.43 kg/m2 General:   Alert,  pleasant and cooperative in NAD Head:  Normocephalic and atraumatic. Neck:  Supple; no masses or thyromegaly. Lungs:  Clear throughout to auscultation.    Heart:  Regular rate and rhythm. Abdomen:  Soft, nontender and nondistended. Normal bowel sounds, without guarding, and without rebound.   Neurologic:  Alert and  oriented x4;  grossly normal neurologically.  Impression/Plan: Hailey Patterson is here for an colonoscopy to be performed for  screening, fam hx colon ca.   Risks, benefits, limitations, and alternatives regarding  colonoscopy have been reviewed with the patient.  Questions have been answered.  All parties agreeable.   Josefine Class, MD  05/01/2015, 8:14 AM

## 2015-05-01 NOTE — Anesthesia Preprocedure Evaluation (Signed)
Anesthesia Evaluation  Patient identified by MRN, date of birth, ID band Patient awake    Reviewed: Allergy & Precautions, H&P , NPO status , Patient's Chart, lab work & pertinent test results, reviewed documented beta blocker date and time   Airway Mallampati: II  TM Distance: >3 FB Neck ROM: full    Dental no notable dental hx.    Pulmonary neg pulmonary ROS, COPDCurrent Smoker,  breath sounds clear to auscultation  Pulmonary exam normal       Cardiovascular Exercise Tolerance: Good negative cardio ROS  Rhythm:regular Rate:Normal     Neuro/Psych negative neurological ROS  negative psych ROS   GI/Hepatic negative GI ROS, Neg liver ROS,   Endo/Other  negative endocrine ROS  Renal/GU negative Renal ROS  negative genitourinary   Musculoskeletal   Abdominal   Peds  Hematology negative hematology ROS (+)   Anesthesia Other Findings   Reproductive/Obstetrics negative OB ROS                             Anesthesia Physical Anesthesia Plan  ASA: III  Anesthesia Plan: General   Post-op Pain Management:    Induction:   Airway Management Planned:   Additional Equipment:   Intra-op Plan:   Post-operative Plan:   Informed Consent: I have reviewed the patients History and Physical, chart, labs and discussed the procedure including the risks, benefits and alternatives for the proposed anesthesia with the patient or authorized representative who has indicated his/her understanding and acceptance.   Dental Advisory Given  Plan Discussed with: CRNA  Anesthesia Plan Comments:         Anesthesia Quick Evaluation

## 2015-05-02 ENCOUNTER — Encounter: Payer: Self-pay | Admitting: Gastroenterology

## 2015-07-03 DIAGNOSIS — G5600 Carpal tunnel syndrome, unspecified upper limb: Secondary | ICD-10-CM | POA: Insufficient documentation

## 2015-12-25 ENCOUNTER — Encounter: Payer: Self-pay | Admitting: Sports Medicine

## 2015-12-25 ENCOUNTER — Ambulatory Visit (INDEPENDENT_AMBULATORY_CARE_PROVIDER_SITE_OTHER): Payer: Medicaid Other | Admitting: Sports Medicine

## 2015-12-25 DIAGNOSIS — Z9889 Other specified postprocedural states: Secondary | ICD-10-CM

## 2015-12-25 DIAGNOSIS — M79672 Pain in left foot: Secondary | ICD-10-CM

## 2015-12-25 DIAGNOSIS — Q828 Other specified congenital malformations of skin: Secondary | ICD-10-CM | POA: Diagnosis not present

## 2015-12-25 DIAGNOSIS — M79671 Pain in right foot: Secondary | ICD-10-CM

## 2015-12-25 DIAGNOSIS — L84 Corns and callosities: Secondary | ICD-10-CM

## 2015-12-25 NOTE — Progress Notes (Signed)
Patient ID: Hailey Patterson, female   DOB: 1956-09-13, 60 y.o.   MRN: [id number] Subjective: Hailey Patterson is a 60 y.o. female patient who presents to office for evaluation of bilateral foot pain. Patient complains of pain at the lesions for several weeks; stated that she wanted to cut them out herself but she wasn't sure what they were. Patient denies any other pedal complaints.   Admits to significant history of previous foot surgery and is concerned about possible recurrence of hammertoe especially at 2nd toes bilateral.  There are no active problems to display for this patient.  Current Outpatient Prescriptions on File Prior to Visit  Medication Sig Dispense Refill  . naproxen sodium (ANAPROX) 220 MG tablet Take 440 mg by mouth at bedtime as needed (pain).     No current facility-administered medications on file prior to visit.   Allergies  Allergen Reactions  . Codeine Nausea And Vomiting  . Meperidine Nausea And Vomiting  . Morphine Nausea And Vomiting     Objective:  General: Alert and oriented x3 in no acute distress  Dermatology: Keratotic lesions present with central nucleated core noted bilateral sub met 4 and 5 on left, There is also callus present at plantar left hallux, no signs of infection, no webspace macerations, no ecchymosis bilateral, all nails x 10 are well manicured. Surgical scars that are well healed.   Vascular: Dorsalis Pedis and Posterior Tibial pedal pulses 2/4, Capillary Fill Time 3 seconds, + pedal hair growth bilateral, no edema bilateral lower extremities, Temperature gradient within normal limits.  Neurology: Johney Maine sensation intact via light touch bilateral.  Musculoskeletal: Mild tenderness with palpation at the keratotic lesions/porokeratosis/callus sites bilateral, Mild fat pad displacement with prominent 4th met heads and left hallux extensus, early 2nd hammertoe contracture, Muscular strength 5/5 in all groups without pain or limitation on range of  motion.   Assessment and Plan: Problem List Items Addressed This Visit    None    Visit Diagnoses    Callus of foot    -  Primary    Porokeratosis        History of foot surgery        bunion, hammertoe, neuroma, tarsal tunnel excisison of soft tissue mass; 08/2014    Foot pain, bilateral          -Complete examination performed -Discussed treatement options callus and porokeratosis; explained to patient these lesions may recur secondary to foot type and change of foot pressure/mechanics after surgery -Recommened patient to keep skin moist with emollients and pomice stone to slow build up of keratotic skin -Recommend good supportive shoes for foot type and inserts; patient to bring current inserts to next encounter -Parred keratoic lesions using a chisel blade without incident; treated the area with Salinocaine and moleskin to keep intact for 1 day -Patient to return to office in 4 weeks or sooner if condition worsens.  Landis Martins, DPM

## 2016-01-22 ENCOUNTER — Encounter: Payer: Self-pay | Admitting: Sports Medicine

## 2016-01-22 ENCOUNTER — Telehealth: Payer: Self-pay | Admitting: *Deleted

## 2016-01-22 ENCOUNTER — Ambulatory Visit (INDEPENDENT_AMBULATORY_CARE_PROVIDER_SITE_OTHER): Payer: Medicaid Other | Admitting: Sports Medicine

## 2016-01-22 DIAGNOSIS — M216X9 Other acquired deformities of unspecified foot: Secondary | ICD-10-CM | POA: Diagnosis not present

## 2016-01-22 DIAGNOSIS — M79672 Pain in left foot: Secondary | ICD-10-CM

## 2016-01-22 DIAGNOSIS — M79671 Pain in right foot: Secondary | ICD-10-CM

## 2016-01-22 DIAGNOSIS — L84 Corns and callosities: Secondary | ICD-10-CM

## 2016-01-22 DIAGNOSIS — Q828 Other specified congenital malformations of skin: Secondary | ICD-10-CM

## 2016-01-22 NOTE — Patient Instructions (Signed)
Pre-Operative Instructions  Congratulations, you have decided to take an important step to improving your quality of life.  You can be assured that the doctors of [facility] will be with you every step of the way.  1. Plan to be at the surgery center/hospital at least 1 (one) hour prior to your scheduled time unless otherwise directed by the surgical center/hospital staff.  You must have a responsible adult accompany you, remain during the surgery and drive you home.  Make sure you have directions to the surgical center/hospital and know how to get there on time. 2. For hospital based surgery you will need to obtain a history and physical form from your family physician within 1 month prior to the date of surgery- we will give you a form for you primary physician.  3. We make every effort to accommodate the date you request for surgery.  There are however, times where surgery dates or times have to be moved.  We will contact you as soon as possible if a change in schedule is required.   4. No Aspirin/Ibuprofen for one week before surgery.  If you are on aspirin, any non-steroidal anti-inflammatory medications (Mobic, Aleve, Ibuprofen) you should stop taking it 7 days prior to your surgery.  You make take Tylenol  For pain prior to surgery.  5. Medications- If you are taking daily heart and blood pressure medications, seizure, reflux, allergy, asthma, anxiety, pain or diabetes medications, make sure the surgery center/hospital is aware before the day of surgery so they may notify you which medications to take or avoid the day of surgery. 6. No food or drink after midnight the night before surgery unless directed otherwise by surgical center/hospital staff. 7. No alcoholic beverages 24 hours prior to surgery.  No smoking 24 hours prior to or 24 hours after surgery. 8. Wear loose pants or shorts- loose enough to fit over bandages, boots, and casts. 9. No slip on shoes, sneakers are best. 10. Bring  your boot with you to the surgery center/hospital.  Also bring crutches or a walker if your physician has prescribed it for you.  If you do not have this equipment, it will be provided for you after surgery. 11. If you have not been contracted by the surgery center/hospital by the day before your surgery, call to confirm the date and time of your surgery. 12. Leave-time from work may vary depending on the type of surgery you have.  Appropriate arrangements should be made prior to surgery with your employer. 13. Prescriptions will be provided immediately following surgery by your doctor.  Have these filled as soon as possible after surgery and take the medication as directed. 14. Remove nail polish on the operative foot. 15. Wash the night before surgery.  The night before surgery wash the foot and leg well with the antibacterial soap provided and water paying special attention to beneath the toenails and in between the toes.  Rinse thoroughly with water and dry well with a towel.  Perform this wash unless told not to do so by your physician.  Enclosed: 1 Ice pack (please put in freezer the night before surgery)   1 Hibiclens skin cleaner   Pre-op Instructions  If you have any questions regarding the instructions, do not hesitate to call our office.  [place]: [address] [place], [place] [address] [phone]  [place]: [address] [place], [place] [address] [phone]  [place]: [address]  [place], [place] [address] [phone]  Dr. Richard   Tuchman DPM, Dr. Norman Regal DPM Dr. Richard Sikora DPM, Dr. M. Todd Hyatt DPM, Dr. Travonna Swindle DPM 

## 2016-01-22 NOTE — Telephone Encounter (Signed)
"  I was told to call you to schedule my surgery."  Which doctor do you see?  "She's very nice.  I saw her in the [place] office today."  I don't have your information but I can go ahead and get a date for you.  Do not register now wait a couple of days until I can get your information.  She can do it on 02/04/2016.  "That date will be fine."  Surgical center will call you with the arrival time for surgery.

## 2016-01-22 NOTE — Progress Notes (Signed)
Patient ID: Hailey Patterson, female   DOB: [birth year], 60 y.o.   MRN: [id number]  Subjective: Hailey Patterson is a 60 y.o. female patient who returns to office for evaluation of bilateral foot pain. Patient complains of pain at the lesions that have started to come back; pain is to the point where is has difficulty bearing weight on the ball of both feet; desires surgical excision of lesions. Patient denies any other pedal complaints.    There are no active problems to display for this patient.  Current Outpatient Prescriptions on File Prior to Visit  Medication Sig Dispense Refill  . naproxen sodium (ANAPROX) 220 MG tablet Take 440 mg by mouth at bedtime as needed (pain).     No current facility-administered medications on file prior to visit.   Allergies  Allergen Reactions  . Codeine Nausea And Vomiting  . Meperidine Nausea And Vomiting  . Morphine Nausea And Vomiting   Social History   Social History  . Marital Status: Married    Spouse Name: N/A  . Number of Children: N/A  . Years of Education: N/A   Occupational History  . Not on file.   Social History Main Topics  . Smoking status: Light Tobacco Smoker  . Smokeless tobacco: Not on file  . Alcohol Use: No  . Drug Use: Not on file  . Sexual Activity: Not Currently   Other Topics Concern  . Not on file   Social History Narrative    No family history on file.   Past Surgical History  Procedure Laterality Date  . Abdominal hysterectomy    . Kidney surgery    . Tonsillectomy    . Cesarean section    . Foot surgery      bilateral feet  . Colonoscopy N/A 05/01/2015    Procedure: COLONOSCOPY;  Surgeon: Elnita Maxwell, MD;  Location: [facility] ENDOSCOPY;  Service: Endoscopy;  Laterality: N/A;     Objective:  General: Alert and oriented x3 in no acute distress  Dermatology: Keratotic lesions present with central nucleated core noted bilateral sub met 4 and 5 on left, There is also callus present at plantar left  hallux, no signs of infection, no webspace macerations, no ecchymosis bilateral, all nails x 10 are well manicured. Surgical scars that are well healed.   Vascular: Dorsalis Pedis and Posterior Tibial pedal pulses 2/4, Capillary Fill Time 3 seconds, + pedal hair growth bilateral, no edema bilateral lower extremities, Temperature gradient within normal limits.  Neurology: Michaell Cowing sensation intact via light touch bilateral.  Musculoskeletal: Mild to moderate tenderness with palpation at the keratotic lesions/porokeratosis/callus sites bilateral, Mild fat pad displacement with prominent 4th met heads and left hallux extensus, early 2nd hammertoe contracture, Muscular strength 5/5 in all groups.  Assessment and Plan: Problem List Items Addressed This Visit    None    Visit Diagnoses    Callus of foot    -  Primary    Porokeratosis        Prominent metatarsal head, unspecified laterality        Foot pain, bilateral          -Complete examination performed -Discussed treatement options callus and porokeratosis; explained to patient these lesions may recur secondary to foot type and change of foot pressure/mechanics after surgery; patient desires attempt to surgically excise the lesions --Patient opt for surgical management. Consent obtain for excision of lesionsbilateral with possible removal of bone/prominences. Pre and Post op course explained. Surgical booking slip submitted and provided  patient with Surgical packet and info for [place]. All questions answered; No gaurantees given or implied.  -Patient will require medical clearance due to current smoking status and hx of breast CA.  -Dispensed surgical shoes to use post op -In the meantime, Recommened patient to keep skin moist with emollients and pomice stone to slow build up of keratotic skin -Recommend good supportive shoes for foot type and inserts; aquatics inserts; Patient may require custom inserts after surgery -Parred keratoic lesions  using a chisel blade without incident; treated the area with Salinocaine and moleskin to keep intact for 1 day -Patient to return to office after surgery or sooner if condition worsens.  Landis Martins, DPM

## 2016-01-23 ENCOUNTER — Encounter: Payer: Self-pay | Admitting: *Deleted

## 2016-01-23 ENCOUNTER — Telehealth: Payer: Self-pay | Admitting: *Deleted

## 2016-01-23 NOTE — Telephone Encounter (Signed)
Medical clearance request was sent to Dr. Oliva Bustard for surgery scheduled for 02/04/2016.

## 2016-02-04 ENCOUNTER — Encounter: Payer: Self-pay | Admitting: Sports Medicine

## 2016-02-04 DIAGNOSIS — D492 Neoplasm of unspecified behavior of bone, soft tissue, and skin: Secondary | ICD-10-CM | POA: Diagnosis not present

## 2016-02-04 DIAGNOSIS — M21542 Acquired clubfoot, left foot: Secondary | ICD-10-CM | POA: Diagnosis not present

## 2016-02-05 ENCOUNTER — Telehealth: Payer: Self-pay | Admitting: Sports Medicine

## 2016-02-05 NOTE — Telephone Encounter (Signed)
Post op phone call made to patient. There was no answer. Left voicemail message with call back #. -Dr. Cannon Kettle

## 2016-02-11 NOTE — Telephone Encounter (Signed)
Dr. Oliva Bustard gave patient Oncology medical clearance for surgery scheduled for 02/04/2016.

## 2016-02-12 ENCOUNTER — Encounter: Payer: Self-pay | Admitting: Sports Medicine

## 2016-02-12 ENCOUNTER — Ambulatory Visit (INDEPENDENT_AMBULATORY_CARE_PROVIDER_SITE_OTHER): Payer: Medicaid Other

## 2016-02-12 ENCOUNTER — Ambulatory Visit (INDEPENDENT_AMBULATORY_CARE_PROVIDER_SITE_OTHER): Payer: Medicaid Other | Admitting: Sports Medicine

## 2016-02-12 DIAGNOSIS — Z9889 Other specified postprocedural states: Secondary | ICD-10-CM

## 2016-02-12 DIAGNOSIS — M79673 Pain in unspecified foot: Secondary | ICD-10-CM

## 2016-02-12 DIAGNOSIS — M21542 Acquired clubfoot, left foot: Secondary | ICD-10-CM | POA: Diagnosis not present

## 2016-02-12 MED ORDER — PROMETHAZINE HCL 25 MG PO TABS: 25.0000 mg | ORAL_TABLET | Freq: Three times a day (TID) | ORAL | Status: DC | PRN

## 2016-02-12 MED ORDER — OXYCODONE-ACETAMINOPHEN 10-325 MG PO TABS: 1.0000 | ORAL_TABLET | ORAL | Status: DC | PRN

## 2016-02-12 NOTE — Progress Notes (Addendum)
Patient ID: Hailey Patterson, female   DOB: 22-Jan-1956, 60 y.o.   MRN: [id number] Subjective: Ramyia Ruple is a 60 y.o. female patient seen today in office for POV #1 (DOS 02-04-16), S/P partial ostectomy of left hallux and excision of soft tissue lesions plantar surfaces of both feet. Patient denies current pain at surgical sites; admits to more pain at end of the day that is throbbing in nature, denies calf pain, denies headache, chest pain, shortness of breath, nausea, vomiting, fever, or chills. No other issues noted.   There are no active problems to display for this patient.  Current Outpatient Prescriptions on File Prior to Visit  Medication Sig Dispense Refill  . naproxen sodium (ANAPROX) 220 MG tablet Take 440 mg by mouth at bedtime as needed (pain).     No current facility-administered medications on file prior to visit.   Allergies  Allergen Reactions  . Codeine Nausea And Vomiting  . Meperidine Nausea And Vomiting  . Morphine Nausea And Vomiting     Objective: General: No acute distress, AAOx3  Right & Left foot: Sutures intact with no gapping or dehiscence at surgical site, mild swelling to both feet, no erythema, no warmth, no drainage, no signs of infection noted, Capillary fill time <3 seconds in all digits, gross sensation present via light touch to right and left foot. No pain or crepitation with range of motion right and left foot.  No pain with calf compression.   Post Op Xray, Right & Left foot: No acute pathology. Hardware intact from previous surgeries. Soft tissue swelling within normal limits for post op status.   Assessment and Plan:  Problem List Items Addressed This Visit    None    Visit Diagnoses    Foot pain, unspecified laterality    -  Primary    Relevant Medications    oxyCODONE-acetaminophen (PERCOCET) 10-325 MG tablet    promethazine (PHENERGAN) 25 MG tablet    Other Relevant Orders    DG Foot Complete Left    DG Foot Complete Right    Status post  surgery of both feet        DOS 02-04-16    Relevant Medications    oxyCODONE-acetaminophen (PERCOCET) 10-325 MG tablet    promethazine (PHENERGAN) 25 MG tablet      -Patient seen and evaluated -Applied dry sterile dressing to surgical sites on both feet secured with ACE wrap and stockinet  -Advised patient to make sure to keep dressings clean, dry, and intact to left and right surgical sites, removing the ACE as needed  -Advised patient to continue with post-op shoes on both feet; No driving at this time.  -Refilled Percocet pain medication and phenergan to take as needed  -Advised patient to limit activity to necessity with weight to heels only; Cont with rolling walker as needed for assistance with ambulation -Advised patient to ice and elevate as necessary  -Will plan for suture removal at next office visit. In the meantime, patient to call office if any issues or problems arise.   Landis Martins, DPM

## 2016-02-22 ENCOUNTER — Ambulatory Visit (INDEPENDENT_AMBULATORY_CARE_PROVIDER_SITE_OTHER): Payer: Medicaid Other | Admitting: Sports Medicine

## 2016-02-22 ENCOUNTER — Encounter: Payer: Self-pay | Admitting: Sports Medicine

## 2016-02-22 DIAGNOSIS — Z9889 Other specified postprocedural states: Secondary | ICD-10-CM

## 2016-02-22 DIAGNOSIS — M79673 Pain in unspecified foot: Secondary | ICD-10-CM

## 2016-02-22 MED ORDER — OXYCODONE-ACETAMINOPHEN 10-325 MG PO TABS: 1.0000 | ORAL_TABLET | ORAL | Status: DC | PRN

## 2016-02-22 NOTE — Progress Notes (Signed)
Patient ID: Hailey Patterson, female   DOB: [birth year], 60 y.o.   MRN: [id number]  Subjective: Hailey Patterson is a 60 y.o. female patient seen today in office for POV #2 (DOS 02-04-16), S/P partial ostectomy of left hallux and excision of soft tissue lesions plantar surfaces of both feet. Patient admits to mild pain at surgical sites; admits that she hasn't been limiting her activity as recommended; denies calf pain, denies headache, chest pain, shortness of breath, nausea, vomiting, fever, or chills. No other issues noted.   There are no active problems to display for this patient.  Current Outpatient Prescriptions on File Prior to Visit  Medication Sig Dispense Refill  . aspirin EC 81 MG tablet Take by mouth.    . gabapentin (NEURONTIN) 100 MG capsule Take 100 mg by mouth.    Marland Kitchen HYDROcodone-acetaminophen (NORCO/VICODIN) 5-325 MG tablet Take by mouth.    . naproxen sodium (ANAPROX) 220 MG tablet Take 440 mg by mouth at bedtime as needed (pain).    Marland Kitchen oxyCODONE (OXY IR/ROXICODONE) 5 MG immediate release tablet Take 5-10 mg by mouth.    . oxyCODONE-acetaminophen (PERCOCET) 10-325 MG tablet Take 1 tablet by mouth every 4 (four) hours as needed for pain. 30 tablet 0  . predniSONE (DELTASONE) 10 MG tablet     . promethazine (PHENERGAN) 25 MG tablet Take 1 tablet (25 mg total) by mouth every 8 (eight) hours as needed for nausea or vomiting. 20 tablet 0  . tiZANidine (ZANAFLEX) 4 MG tablet Take by mouth.    . traMADol (ULTRAM) 50 MG tablet Take by mouth.     No current facility-administered medications on file prior to visit.   Allergies  Allergen Reactions  . Codeine Nausea And Vomiting  . Meperidine Nausea And Vomiting  . Morphine Nausea And Vomiting     Objective: General: No acute distress, AAOx3  Right & Left foot: Sutures intact with no gapping or dehiscence at surgical site, mild swelling to both feet, no erythema, no warmth, no drainage, no signs of infection noted, Capillary fill time <3  seconds in all digits, gross sensation present via light touch to right and left foot. No pain or crepitation with range of motion right and left foot.  No pain with calf compression.    Assessment and Plan:  Problem List Items Addressed This Visit    None    Visit Diagnoses    Status post surgery of both feet    -  Primary    Foot pain, unspecified laterality          -Patient seen and evaluated -Sutures removed and applied steristrips secured with dry sterile dressing to surgical sites on both feet secured with ACE wrap and stockinet  -Advised patient to may shower after 1 week and remove the ACE as needed  -Advised patient to continue with post-op shoes on both feet; No driving at this time.  -Refilled Percocet pain medication and cont with phenergan to take as needed  -Advised patient to limit activity to necessity with weight to heels only; Cont with rolling walker as needed for assistance with ambulation -Advised patient to ice and elevate as necessary  -Will plan for transition to normal shoes at next office visit in 2 weeks. In the meantime, patient to call office if any issues or problems arise.   Landis Martins, DPM

## 2016-03-03 ENCOUNTER — Emergency Department: Payer: Medicaid Other

## 2016-03-03 ENCOUNTER — Emergency Department
Admission: EM | Admit: 2016-03-03 | Discharge: 2016-03-03 | Disposition: A | Discharge status: Home / Self Care | Payer: Medicaid Other | Attending: Emergency Medicine | Admitting: Emergency Medicine

## 2016-03-03 ENCOUNTER — Encounter: Payer: Self-pay | Admitting: Emergency Medicine

## 2016-03-03 DIAGNOSIS — R61 Generalized hyperhidrosis: Secondary | ICD-10-CM | POA: Diagnosis not present

## 2016-03-03 DIAGNOSIS — R51 Headache: Secondary | ICD-10-CM | POA: Insufficient documentation

## 2016-03-03 DIAGNOSIS — R6883 Chills (without fever): Secondary | ICD-10-CM | POA: Insufficient documentation

## 2016-03-03 DIAGNOSIS — Z7982 Long term (current) use of aspirin: Secondary | ICD-10-CM | POA: Diagnosis not present

## 2016-03-03 DIAGNOSIS — R519 Headache, unspecified: Secondary | ICD-10-CM

## 2016-03-03 DIAGNOSIS — M545 Low back pain: Secondary | ICD-10-CM | POA: Diagnosis not present

## 2016-03-03 DIAGNOSIS — F17200 Nicotine dependence, unspecified, uncomplicated: Secondary | ICD-10-CM | POA: Insufficient documentation

## 2016-03-03 DIAGNOSIS — R079 Chest pain, unspecified: Secondary | ICD-10-CM | POA: Insufficient documentation

## 2016-03-03 LAB — CBC
HCT: 42.7 % (ref 35.0–47.0)
Hemoglobin: 14.7 g/dL (ref 12.0–16.0)
MCH: 32.3 pg (ref 26.0–34.0)
MCHC: 34.4 g/dL (ref 32.0–36.0)
MCV: 93.7 fL (ref 80.0–100.0)
Platelets: 202 10*3/uL (ref 150–440)
RBC: 4.56 MIL/uL (ref 3.80–5.20)
RDW: 13.6 % (ref 11.5–14.5)
WBC: 6.7 10*3/uL (ref 3.6–11.0)

## 2016-03-03 LAB — BASIC METABOLIC PANEL
Anion gap: 8 (ref 5–15)
BUN: 14 mg/dL (ref 6–20)
CALCIUM: 9.4 mg/dL (ref 8.9–10.3)
CO2: 26 mmol/L (ref 22–32)
Chloride: 106 mmol/L (ref 101–111)
Creatinine, Ser: 0.98 mg/dL (ref 0.44–1.00)
GFR calc Af Amer: >60 mL/min (ref >60)
Glucose, Bld: 95 mg/dL (ref 65–99)
Potassium: 3.7 mmol/L (ref 3.5–5.1)
SODIUM: 140 mmol/L (ref 135–145)

## 2016-03-03 LAB — URINALYSIS COMPLETE WITH MICROSCOPIC (ARMC ONLY)
Bacteria, UA: NONE SEEN
Bilirubin Urine: NEGATIVE
Glucose, UA: NEGATIVE mg/dL
KETONES UR: NEGATIVE mg/dL
LEUKOCYTES UA: NEGATIVE
NITRITE: NEGATIVE
PH: 7 (ref 5.0–8.0)
PROTEIN: NEGATIVE mg/dL
SPECIFIC GRAVITY, URINE: 1.003 — AB (ref 1.005–1.030)

## 2016-03-03 LAB — HEPATIC FUNCTION PANEL
ALT: 21 U/L (ref 14–54)
AST: 23 U/L (ref 15–41)
Albumin: 4.2 g/dL (ref 3.5–5.0)
Alkaline Phosphatase: 66 U/L (ref 38–126)
BILIRUBIN INDIRECT: 0.7 mg/dL (ref 0.3–0.9)
Bilirubin, Direct: 0.1 mg/dL (ref 0.1–0.5)
TOTAL PROTEIN: 7.3 g/dL (ref 6.5–8.1)
Total Bilirubin: 0.8 mg/dL (ref 0.3–1.2)

## 2016-03-03 LAB — TROPONIN I: TROPONIN I: 0.03 ng/mL (ref <0.031)

## 2016-03-03 LAB — RAPID INFLUENZA A&B ANTIGENS
Influenza A (ARMC): NEGATIVE
Influenza B (ARMC): NEGATIVE

## 2016-03-03 MED ORDER — SODIUM CHLORIDE 0.9 % IV BOLUS (SEPSIS)
1000.0000 mL | Freq: Once | INTRAVENOUS | Status: AC
  Administered 2016-03-03: 1000 mL via INTRAVENOUS

## 2016-03-03 MED ORDER — ONDANSETRON HCL 4 MG/2ML IJ SOLN
4.0000 mg | Freq: Once | INTRAMUSCULAR | Status: AC
  Administered 2016-03-03: 4 mg via INTRAVENOUS
  Filled 2016-03-03: qty 2

## 2016-03-03 MED ORDER — FENTANYL CITRATE (PF) 100 MCG/2ML IJ SOLN
50.0000 ug | Freq: Once | INTRAMUSCULAR | Status: AC
  Administered 2016-03-03: 50 ug via INTRAVENOUS
  Filled 2016-03-03: qty 2

## 2016-03-03 NOTE — ED Provider Notes (Signed)
[facility] Emergency Department Provider Note  Time seen: 12:41 PM  I have reviewed the triage vital signs and the nursing notes.   HISTORY  Chief Complaint Chest Pain    HPI Hailey Patterson is a 60 y.o. female with a past medical historyof breast cancer in remission who presents to the emergency department with various complaints of intermittent chest pain for the past few Patterson, Hailey Patterson, Hailey Patterson of chills. Denies cough or congestion. States the Hailey started approximately 3 days ago. Describes intermittent sharp stabbing pains to the left chest, states she was just seen at [facility] emergency department 02/25/16 for the same with a negative workup besides a urinary tract infection which she was prescribed an antibiotic. Patient also states Hailey back pain, per primary care provider note in February this has been ongoing chronically. Patient denies any dysuria. Denies fever but states she has been having chills. Moderate dull aching Hailey.     Past Medical History  Diagnosis Date  . Breast cancer ([place])     There are no active problems to display for this patient.   Past Surgical History  Procedure Laterality Date  . Abdominal hysterectomy    . Kidney surgery    . Tonsillectomy    . Cesarean section    . Foot surgery      bilateral feet  . Colonoscopy N/A 05/01/2015    Procedure: COLONOSCOPY;  Surgeon: Josefine Class, MD;  Location: [facility] ENDOSCOPY;  Service: Endoscopy;  Laterality: N/A;    Current Outpatient Rx  Name  Route  Sig  Dispense  Refill  . aspirin EC 81 MG tablet   Oral   Take by mouth.         . gabapentin (NEURONTIN) 100 MG capsule   Oral   Take 100 mg by mouth.         Marland Kitchen HYDROcodone-acetaminophen (NORCO/VICODIN) 5-325 MG tablet   Oral   Take by mouth.         . naproxen sodium (ANAPROX) 220 MG tablet   Oral   Take 440 mg by mouth at bedtime as needed  (pain).         Marland Kitchen oxyCODONE (OXY IR/ROXICODONE) 5 MG immediate release tablet   Oral   Take 5-10 mg by mouth.         . oxyCODONE-acetaminophen (PERCOCET) 10-325 MG tablet   Oral   Take 1 tablet by mouth every 4 (four) hours as needed for pain.   30 tablet   0   . predniSONE (DELTASONE) 10 MG tablet               . promethazine (PHENERGAN) 25 MG tablet   Oral   Take 1 tablet (25 mg total) by mouth every 8 (eight) hours as needed for nausea or vomiting.   20 tablet   0   . tiZANidine (ZANAFLEX) 4 MG tablet   Oral   Take by mouth.         . traMADol (ULTRAM) 50 MG tablet   Oral   Take by mouth.           Allergies Codeine; Meperidine; and Morphine  History reviewed. No pertinent family history.  Social History Social History  Substance Use Topics  . Smoking status: Light Tobacco Smoker  . Smokeless tobacco: None  . Alcohol Use: No    Review of Systems Constitutional: Negative for fever.Positive for chills.  Positive for intermittent sweats. Cardiovascular: Intermittent left chest pain times Patterson. Respiratory: Negative for shortness of breath. Gastrointestinal: Negative for abdominal pain Musculoskeletal: Positive for Hailey back pain Neurological: Positive for Hailey 10-point ROS otherwise negative.  ____________________________________________   PHYSICAL EXAM:  VITAL SIGNS: ED Triage Vitals  Enc Vitals Group     BP 03/03/16 1207 147/96 mmHg     Pulse Rate 03/03/16 1207 98     Resp 03/03/16 1207 20     Temp 03/03/16 1207 97.8 F (36.6 C)     Temp Source 03/03/16 1207 Oral     SpO2 03/03/16 1207 100 %     Weight 03/03/16 1207 150 lb (68.04 kg)     Height 03/03/16 1207 5\' 1"  (1.549 m)     Head Cir --      Peak Flow --      Pain Score 03/03/16 1208 7     Pain Loc --      Pain Edu? --      Excl. in [name]? --     Constitutional: Alert and oriented. Well appearing and in no distress. Eyes: Normal exam ENT   Head: Normocephalic and  atraumatic.Moderate tenderness to percussion of the patient's maxillary sinuses.   Mouth/Throat: Mucous membranes are moist. No pharyngeal erythema Cardiovascular: Normal rate, regular rhythm. No murmur Respiratory: Normal respiratory effort without tachypnea nor retractions. Breath sounds are clear  Gastrointestinal: Soft and nontender. No distention.  Musculoskeletal: Nontender with normal range of motion in all extremities. Moderate Hailey back tenderness bilaterally. Neurologic:  Normal speech and language. No gross focal neurologic deficits Skin:  Skin is warm, dry and intact.  Psychiatric: Mood and affect are normal. Speech and behavior are normal.  ____________________________________________    EKG  EKG reviewed and interpreted by myself shows normal sinus rhythm at 96 bpm, narrow QRS, left axis deviation, normal intervals, no ST changes.  ____________________________________________    RADIOLOGY  Chest x-ray shows no acute abnormality  ____________________________________________    INITIAL IMPRESSION / ASSESSMENT AND PLAN / ED COURSE  Pertinent labs & imaging results that were available during my care of the patient were reviewed by me and considered in my medical decision making (see chart for details).  Patient here with vague complaints of Hailey, chills for the past 3-4 days, intermittent left-sided chest pain for the past 3-4 Patterson, Hailey back pain for Patterson to years. Recently diagnosed with urinary tract infection 02/25/16 at [facility], denies any dysuria. We will check labs, IV hydrate, treat the patient's discomfort while awaiting lab results.  Labs are largely within normal limits, chest x-ray shows no acute abnormality. Patient Patterson better. Influenza negative, troponin negative. We will discharge the patient home with primary care follow-up. Patient is agreeable to this plan.  ____________________________________________   FINAL CLINICAL IMPRESSION(S) / ED  DIAGNOSES  Hailey Chest pain Back pain   Harvest Dark, MD 03/03/16 1437

## 2016-03-03 NOTE — ED Notes (Signed)
Pt to ed with c/o chest pain and headache that started on Friday.  Intermittent stabbing, chest pain on right side of chest, and into back.  Pt reports she is also having cold chills and sweating.

## 2016-03-03 NOTE — Discharge Instructions (Signed)
You have been seen in the emergency department today for chest pain, back pain, headache. Your workup has shown normal results. As we discussed please follow-up with your primary care physician in the next 1-2 days for recheck. Return to the emergency department for any further chest pain, trouble breathing, or any other symptom personally concerning to yourself.   Nonspecific Chest Pain  Chest pain can be caused by many different conditions. There is always a chance that your pain could be related to something serious, such as a heart attack or a blood clot in your lungs. Chest pain can also be caused by conditions that are not life-threatening. If you have chest pain, it is very important to follow up with your health care provider. CAUSES  Chest pain can be caused by:  Heartburn.  Pneumonia or bronchitis.  Anxiety or stress.  Inflammation around your heart (pericarditis) or lung (pleuritis or pleurisy).  A blood clot in your lung.  A collapsed lung (pneumothorax). It can develop suddenly on its own (spontaneous pneumothorax) or from trauma to the chest.  Shingles infection (varicella-zoster virus).  Heart attack.  Damage to the bones, muscles, and cartilage that make up your chest wall. This can include:  Bruised bones due to injury.  Strained muscles or cartilage due to frequent or repeated coughing or overwork.  Fracture to one or more ribs.  Sore cartilage due to inflammation (costochondritis). RISK FACTORS  Risk factors for chest pain may include:  Activities that increase your risk for trauma or injury to your chest.  Respiratory infections or conditions that cause frequent coughing.  Medical conditions or overeating that can cause heartburn.  Heart disease or family history of heart disease.  Conditions or health behaviors that increase your risk of developing a blood clot.  Having had chicken pox (varicella zoster). SIGNS AND SYMPTOMS Chest pain can feel  like:  Burning or tingling on the surface of your chest or deep in your chest.  Crushing, pressure, aching, or squeezing pain.  Dull or sharp pain that is worse when you move, cough, or take a deep breath.  Pain that is also felt in your back, neck, shoulder, or arm, or pain that spreads to any of these areas. Your chest pain may come and go, or it may stay constant. DIAGNOSIS Lab tests or other studies may be needed to find the cause of your pain. Your health care provider may have you take a test called an ambulatory ECG (electrocardiogram). An ECG records your heartbeat patterns at the time the test is performed. You may also have other tests, such as:  Transthoracic echocardiogram (TTE). During echocardiography, sound waves are used to create a picture of all of the heart structures and to look at how blood flows through your heart.  Transesophageal echocardiogram (TEE).This is a more advanced imaging test that obtains images from inside your body. It allows your health care provider to see your heart in finer detail.  Cardiac monitoring. This allows your health care provider to monitor your heart rate and rhythm in real time.  Holter monitor. This is a portable device that records your heartbeat and can help to diagnose abnormal heartbeats. It allows your health care provider to track your heart activity for several days, if needed.  Stress tests. These can be done through exercise or by taking medicine that makes your heart beat more quickly.  Blood tests.  Imaging tests. TREATMENT  Your treatment depends on what is causing your chest pain. Treatment  may include:  Medicines. These may include:  Acid blockers for heartburn.  Anti-inflammatory medicine.  Pain medicine for inflammatory conditions.  Antibiotic medicine, if an infection is present.  Medicines to dissolve blood clots.  Medicines to treat coronary artery disease.  Supportive care for conditions that do not  require medicines. This may include:  Resting.  Applying heat or cold packs to injured areas.  Limiting activities until pain decreases. HOME CARE INSTRUCTIONS  If you were prescribed an antibiotic medicine, finish it all even if you start to feel better.  Avoid any activities that bring on chest pain.  Do not use any tobacco products, including cigarettes, chewing tobacco, or electronic cigarettes. If you need help quitting, ask your health care provider.  Do not drink alcohol.  Take medicines only as directed by your health care provider.  Keep all follow-up visits as directed by your health care provider. This is important. This includes any further testing if your chest pain does not go away.  If heartburn is the cause for your chest pain, you may be told to keep your head raised (elevated) while sleeping. This reduces the chance that acid will go from your stomach into your esophagus.  Make lifestyle changes as directed by your health care provider. These may include:  Getting regular exercise. Ask your health care provider to suggest some activities that are safe for you.  Eating a heart-healthy diet. A registered dietitian can help you to learn healthy eating options.  Maintaining a healthy weight.  Managing diabetes, if necessary.  Reducing stress. SEEK MEDICAL CARE IF:  Your chest pain does not go away after treatment.  You have a rash with blisters on your chest.  You have a fever. SEEK IMMEDIATE MEDICAL CARE IF:   Your chest pain is worse.  You have an increasing cough, or you cough up blood.  You have severe abdominal pain.  You have severe weakness.  You faint.  You have chills.  You have sudden, unexplained chest discomfort.  You have sudden, unexplained discomfort in your arms, back, neck, or jaw.  You have shortness of breath at any time.  You suddenly start to sweat, or your skin gets clammy.  You feel nauseous or you vomit.  You  suddenly feel light-headed or dizzy.  Your heart begins to beat quickly, or it feels like it is skipping beats. These symptoms may represent a serious problem that is an emergency. Do not wait to see if the symptoms will go away. Get medical help right away. Call your local emergency services (911 in the [place] Do not drive yourself to the hospital.   This information is not intended to replace advice given to you by your health care provider. Make sure you discuss any questions you have with your health care provider.   Document Released: 09/17/2005 Document Revised: 12/29/2014 Document Reviewed: 07/14/2014 Elsevier Interactive Patient Education 2016 Elsevier Inc[employer]  Back Pain, Adult Back pain is very common in adults.The cause of back pain is rarely dangerous and the pain often gets better over time.The cause of your back pain may not be known. Some common causes of back pain include:  Strain of the muscles or ligaments supporting the spine.  Wear and tear (degeneration) of the spinal disks.  Arthritis.  Direct injury to the back. For many people, back pain may return. Since back pain is rarely dangerous, most people can learn to manage this condition on their own. HOME CARE INSTRUCTIONS Watch your back pain  for any changes. The following actions may help to lessen any discomfort you are feeling:  Remain active. It is stressful on your back to sit or stand in one place for long periods of time. Do not sit, drive, or stand in one place for more than 30 minutes at a time. Take short walks on even surfaces as soon as you are able.Try to increase the length of time you walk each day.  Exercise regularly as directed by your health care provider. Exercise helps your back heal faster. It also helps avoid future injury by keeping your muscles strong and flexible.  Do not stay in bed.Resting more than 1-2 days can delay your recovery.  Pay attention to your body when you bend and lift. The  most comfortable positions are those that put less stress on your recovering back. Always use proper lifting techniques, including:  Bending your knees.  Keeping the load close to your body.  Avoiding twisting.  Find a comfortable position to sleep. Use a firm mattress and lie on your side with your knees slightly bent. If you lie on your back, put a pillow under your knees.  Avoid feeling anxious or stressed.Stress increases muscle tension and can worsen back pain.It is important to recognize when you are anxious or stressed and learn ways to manage it, such as with exercise.  Take medicines only as directed by your health care provider. Over-the-counter medicines to reduce pain and inflammation are often the most helpful.Your health care provider may prescribe muscle relaxant drugs.These medicines help dull your pain so you can more quickly return to your normal activities and healthy exercise.  Apply ice to the injured area:  Put ice in a plastic bag.  Place a towel between your skin and the bag.  Leave the ice on for 20 minutes, 2-3 times a day for the first 2-3 days. After that, ice and heat may be alternated to reduce pain and spasms.  Maintain a healthy weight. Excess weight puts extra stress on your back and makes it difficult to maintain good posture. SEEK MEDICAL CARE IF:  You have pain that is not relieved with rest or medicine.  You have increasing pain going down into the legs or buttocks.  You have pain that does not improve in one week.  You have night pain.  You lose weight.  You have a fever or chills. SEEK IMMEDIATE MEDICAL CARE IF:   You develop new bowel or bladder control problems.  You have unusual weakness or numbness in your arms or legs.  You develop nausea or vomiting.  You develop abdominal pain.  You feel faint.   This information is not intended to replace advice given to you by your health care provider. Make sure you discuss any  questions you have with your health care provider.   Document Released: 12/08/2005 Document Revised: 12/29/2014 Document Reviewed: 04/11/2014 Elsevier Interactive Patient Education 2016 [place] Headache Without Cause A headache is pain or discomfort felt around the head or neck area. The specific cause of a headache may not be found. There are many causes and types of headaches. A few common ones are:  Tension headaches.  Migraine headaches.  Cluster headaches.  Chronic daily headaches. HOME CARE INSTRUCTIONS  Watch your condition for any changes. Take these steps to help with your condition: Managing Pain  Take over-the-counter and prescription medicines only as told by your health care provider.  Lie down in a dark, quiet room when  you have a headache.  If directed, apply ice to the head and neck area:  Put ice in a plastic bag.  Place a towel between your skin and the bag.  Leave the ice on for 20 minutes, 2-3 times per day.  Use a heating pad or hot shower to apply heat to the head and neck area as told by your health care provider.  Keep lights dim if bright lights bother you or make your headaches worse. Eating and Drinking  Eat meals on a regular schedule.  Limit alcohol use.  Decrease the amount of caffeine you drink, or stop drinking caffeine. General Instructions  Keep all follow-up visits as told by your health care provider. This is important.  Keep a headache journal to help find out what may trigger your headaches. For example, write down:  What you eat and drink.  How much sleep you get.  Any change to your diet or medicines.  Try massage or other relaxation techniques.  Limit stress.  Sit up straight, and do not tense your muscles.  Do not use tobacco products, including cigarettes, chewing tobacco, or e-cigarettes. If you need help quitting, ask your health care provider.  Exercise regularly as told by your health care  provider.  Sleep on a regular schedule. Get 7-9 hours of sleep, or the amount recommended by your health care provider. SEEK MEDICAL CARE IF:   Your symptoms are not helped by medicine.  You have a headache that is different from the usual headache.  You have nausea or you vomit.  You have a fever. SEEK IMMEDIATE MEDICAL CARE IF:   Your headache becomes severe.  You have repeated vomiting.  You have a stiff neck.  You have a loss of vision.  You have problems with speech.  You have pain in the eye or ear.  You have muscular weakness or loss of muscle control.  You lose your balance or have trouble walking.  You feel faint or pass out.  You have confusion.   This information is not intended to replace advice given to you by your health care provider. Make sure you discuss any questions you have with your health care provider.   Document Released: 12/08/2005 Document Revised: 08/29/2015 Document Reviewed: 04/02/2015 Elsevier Interactive Patient Education [employer].

## 2016-03-11 ENCOUNTER — Encounter: Payer: Self-pay | Admitting: Sports Medicine

## 2016-03-11 ENCOUNTER — Ambulatory Visit (INDEPENDENT_AMBULATORY_CARE_PROVIDER_SITE_OTHER): Payer: Medicaid Other | Admitting: Sports Medicine

## 2016-03-11 DIAGNOSIS — M79673 Pain in unspecified foot: Secondary | ICD-10-CM

## 2016-03-11 DIAGNOSIS — Z9889 Other specified postprocedural states: Secondary | ICD-10-CM

## 2016-03-11 NOTE — Progress Notes (Signed)
Patient ID: Hailey Patterson, female   DOB: 1956/10/31, 60 y.o.   MRN: [id number] Subjective: Hailey Patterson is a 60 y.o. female patient seen today in office for POV #3 (DOS 02-04-16), S/P partial ostectomy of left hallux and excision of soft tissue lesions plantar surfaces of both feet. Patient admits to mild pain at surgical sites; admits that she hasn't been limiting her activity as recommended and has now started to wear normal shoes; denies calf pain, denies headache, chest pain, shortness of breath, nausea, vomiting, fever, or chills. Admits that she is concerned with the changes at the right 2nd hammertoe; the toe is starting to elevate and rub in shoe. No other issues noted.   Patient Active Problem List   Diagnosis Date Noted  . Carpal tunnel syndrome 07/03/2015  . Abnormal breast finding 04/06/2015  . Itch of skin 04/06/2015  . General patient noncompliance 02/22/2015  . Bunion 09/08/2014  . Bilateral cataracts 09/07/2014  . Malignant neoplasm of breast ([place]) 09/07/2014  . Chest pain 07/25/2014  . Current tobacco use 10/14/2013  . 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 docusate sodium (COLACE) 100 MG capsule Take 100 mg by mouth daily as needed for mild constipation.    Marland Kitchen oxyCODONE-acetaminophen (PERCOCET) 10-325 MG tablet Take 1 tablet by mouth every 4 (four) hours as needed for pain. (Patient not taking: Reported on 03/03/2016) 30 tablet 0  . promethazine (PHENERGAN) 25 MG tablet Take 1 tablet (25 mg total) by mouth every 8 (eight) hours as needed for nausea or vomiting. (Patient not taking: Reported on 03/03/2016) 20 tablet 0   No current facility-administered medications on file prior to visit.   Allergies  Allergen Reactions  . Macrobid [Nitrofurantoin] Shortness Of Breath and Nausea And Vomiting  . Codeine Nausea And Vomiting  . Meperidine Nausea And  Vomiting  . Morphine Nausea And Vomiting     Objective: General: No acute distress, AAOx3  Right & Left foot: Incisions coapted with mild scaling surgical sites, mild swelling to both feet, no erythema, no warmth, no drainage, no signs of infection noted, Capillary fill time <3 seconds in all digits, gross sensation present via light touch to right and left foot. No pain or crepitation with range of motion right and left foot. Mild slight elevation at right 2nd toe at area of previous surgery with dorsal scar contracture with no pain. No pain with calf compression bilateral.    Assessment and Plan:  Problem List Items Addressed This Visit    None    Visit Diagnoses    Status post surgery of both feet    -  Primary    Foot pain, unspecified laterality          -Patient seen and evaluated -Recommend cont with ACE and or support stocking to assist with edema control -Advised patient to continue with post-op shoes on both feet and slowly over the next few weeks transition to normal shoes as tolerated; No driving at this time until in normal shoes. No prolonged standing or walking >20 mins until symptoms improve -Epsom salt soaks as needed -Recommend to massage scar with vit E and to tape right 2nd toe as needed for elevation -Advised patient to limit activity to necessity  -Advised patient to ice and elevate as necessary  -Patient to return in 1 month for follow up evaluation. In the meantime, patient to  call office if any issues or problems arise.   Landis Martins, DPM

## 2016-03-11 NOTE — Progress Notes (Signed)
Patient ID: Jennife Razavi, female   DOB: Jan 13, 1956, 60 y.o.   MRN: [id number]  Dr. Cannon Kettle performed an Excision of soft tissue lesions (both feet) with possible bone removal / prominent bone at metatarsal and hallux on 02/04/16 at the [facility].

## 2016-04-07 IMAGING — CR DG CHEST 2V
1 series · 2 of 2 positions shown · non-contrast
Comparison: None.

CLINICAL DATA: Chest pain

EXAM:
CHEST  2 VIEW

[Series 1: pa · 0.17mm/px · 2 of 2 slices shown]
[im 1/2]
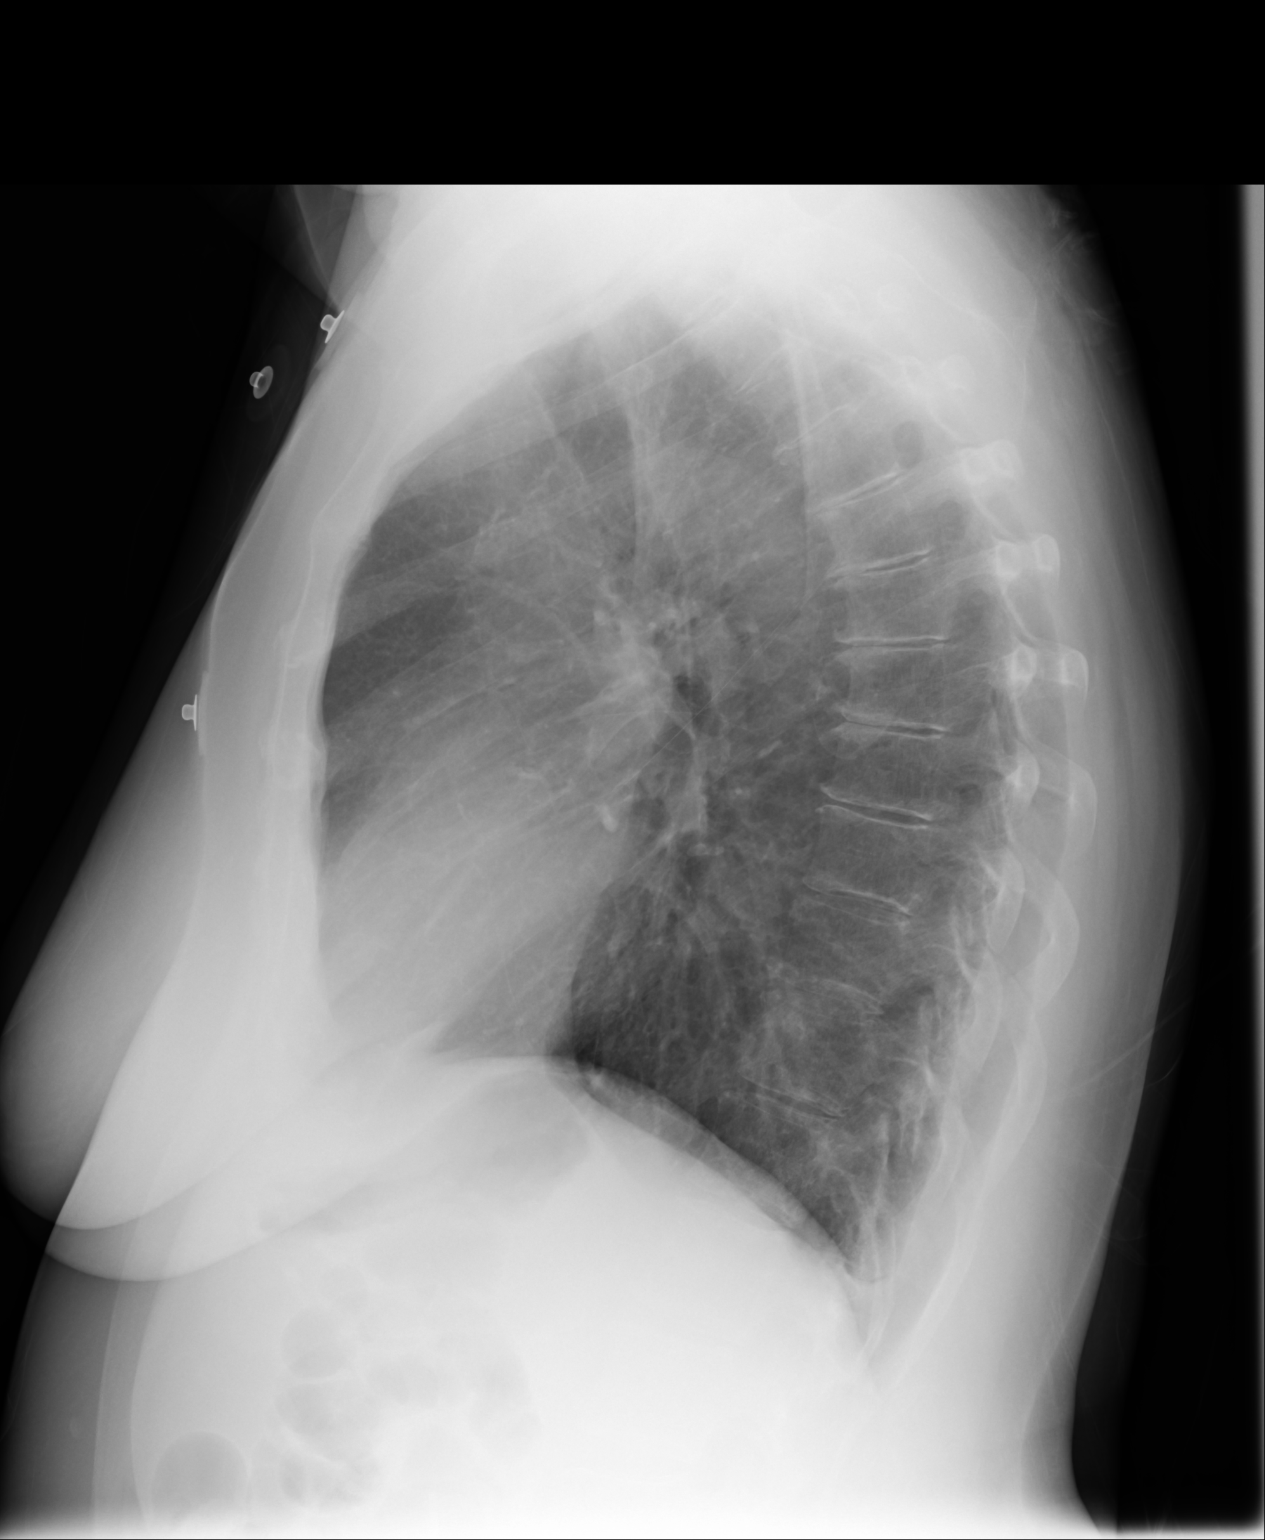
[im 2/2]
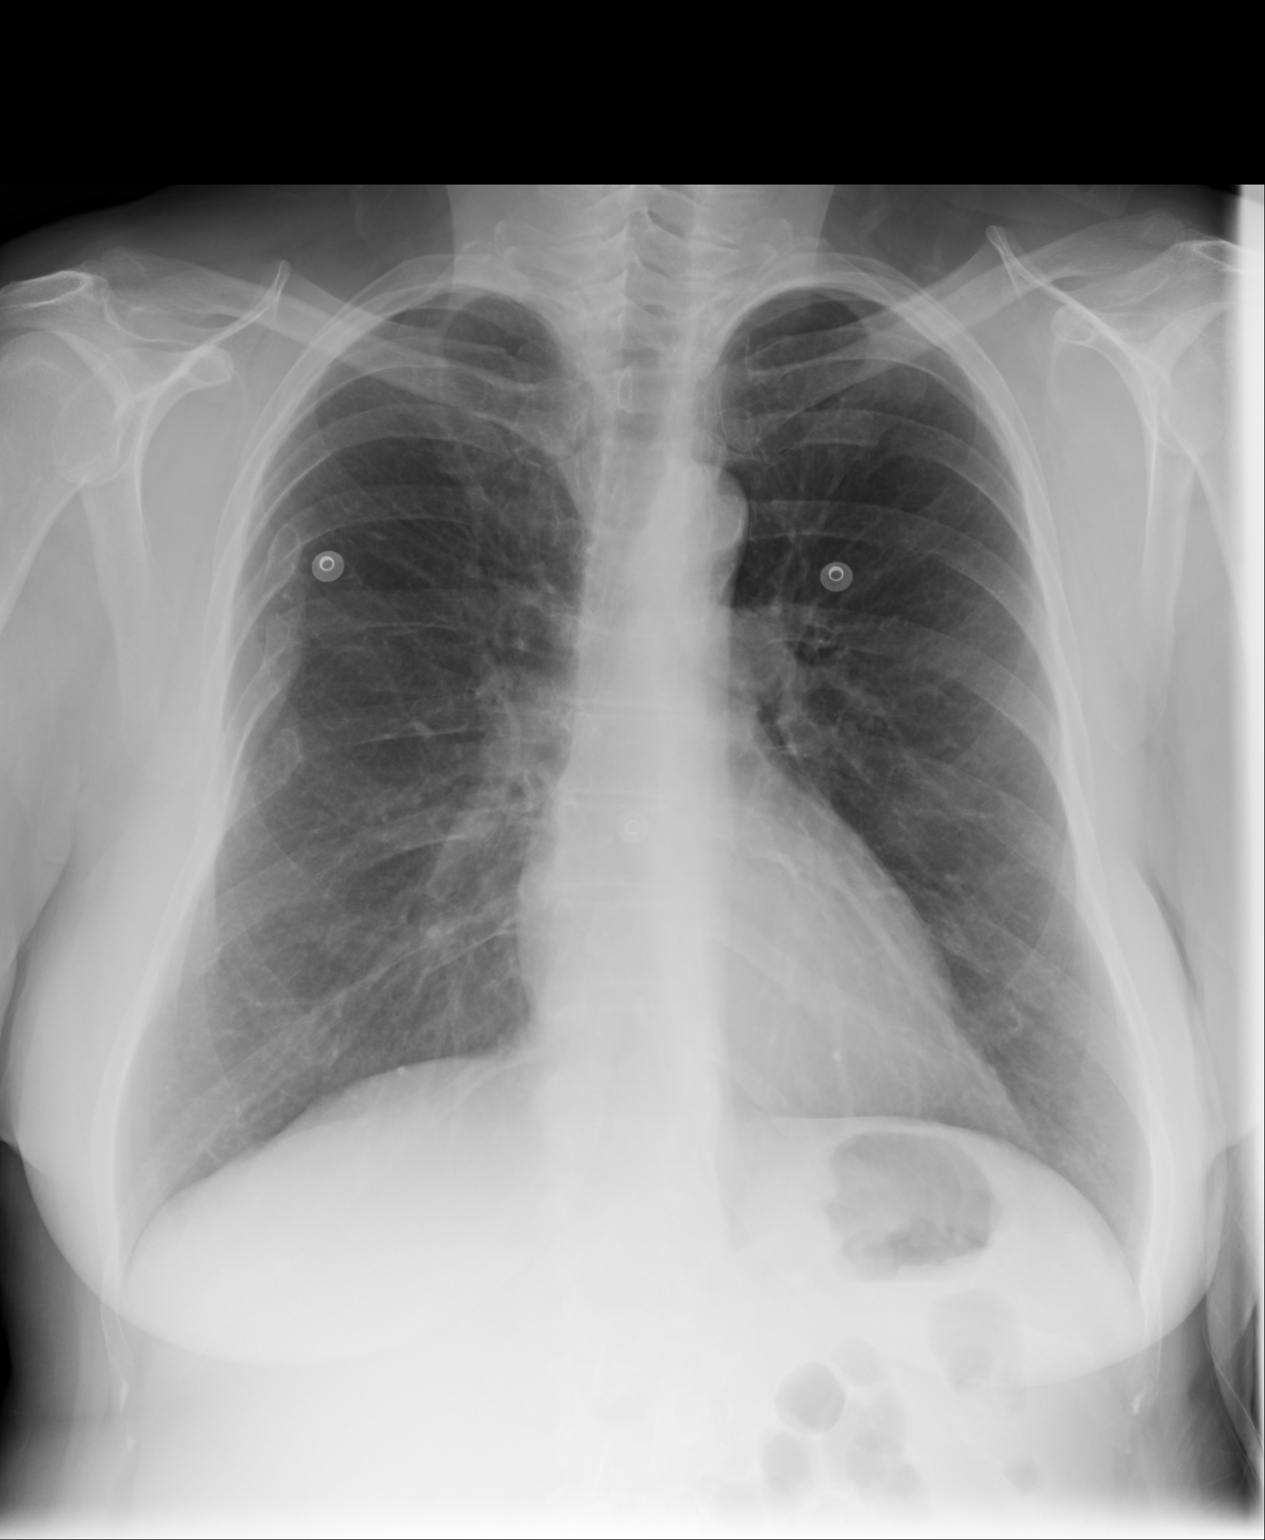

[2 of 2 positions shown; findings below may reference images not displayed]

FINDINGS: The heart size and mediastinal contours are within normal limits.
Both lungs are clear. The visualized skeletal structures are
unremarkable. Remote fractures of the right posterior sixth,
seventh, eighth, and ninth ribs are noted.
IMPRESSION: No active cardiopulmonary disease.

## 2016-04-10 IMAGING — CR DG CHEST 2V
2 series · 2 of 2 positions shown · non-contrast
Comparison: 07/23/2014

CLINICAL DATA: Chest and right arm pain

EXAM:
CHEST  2 VIEW

[w chest pa]
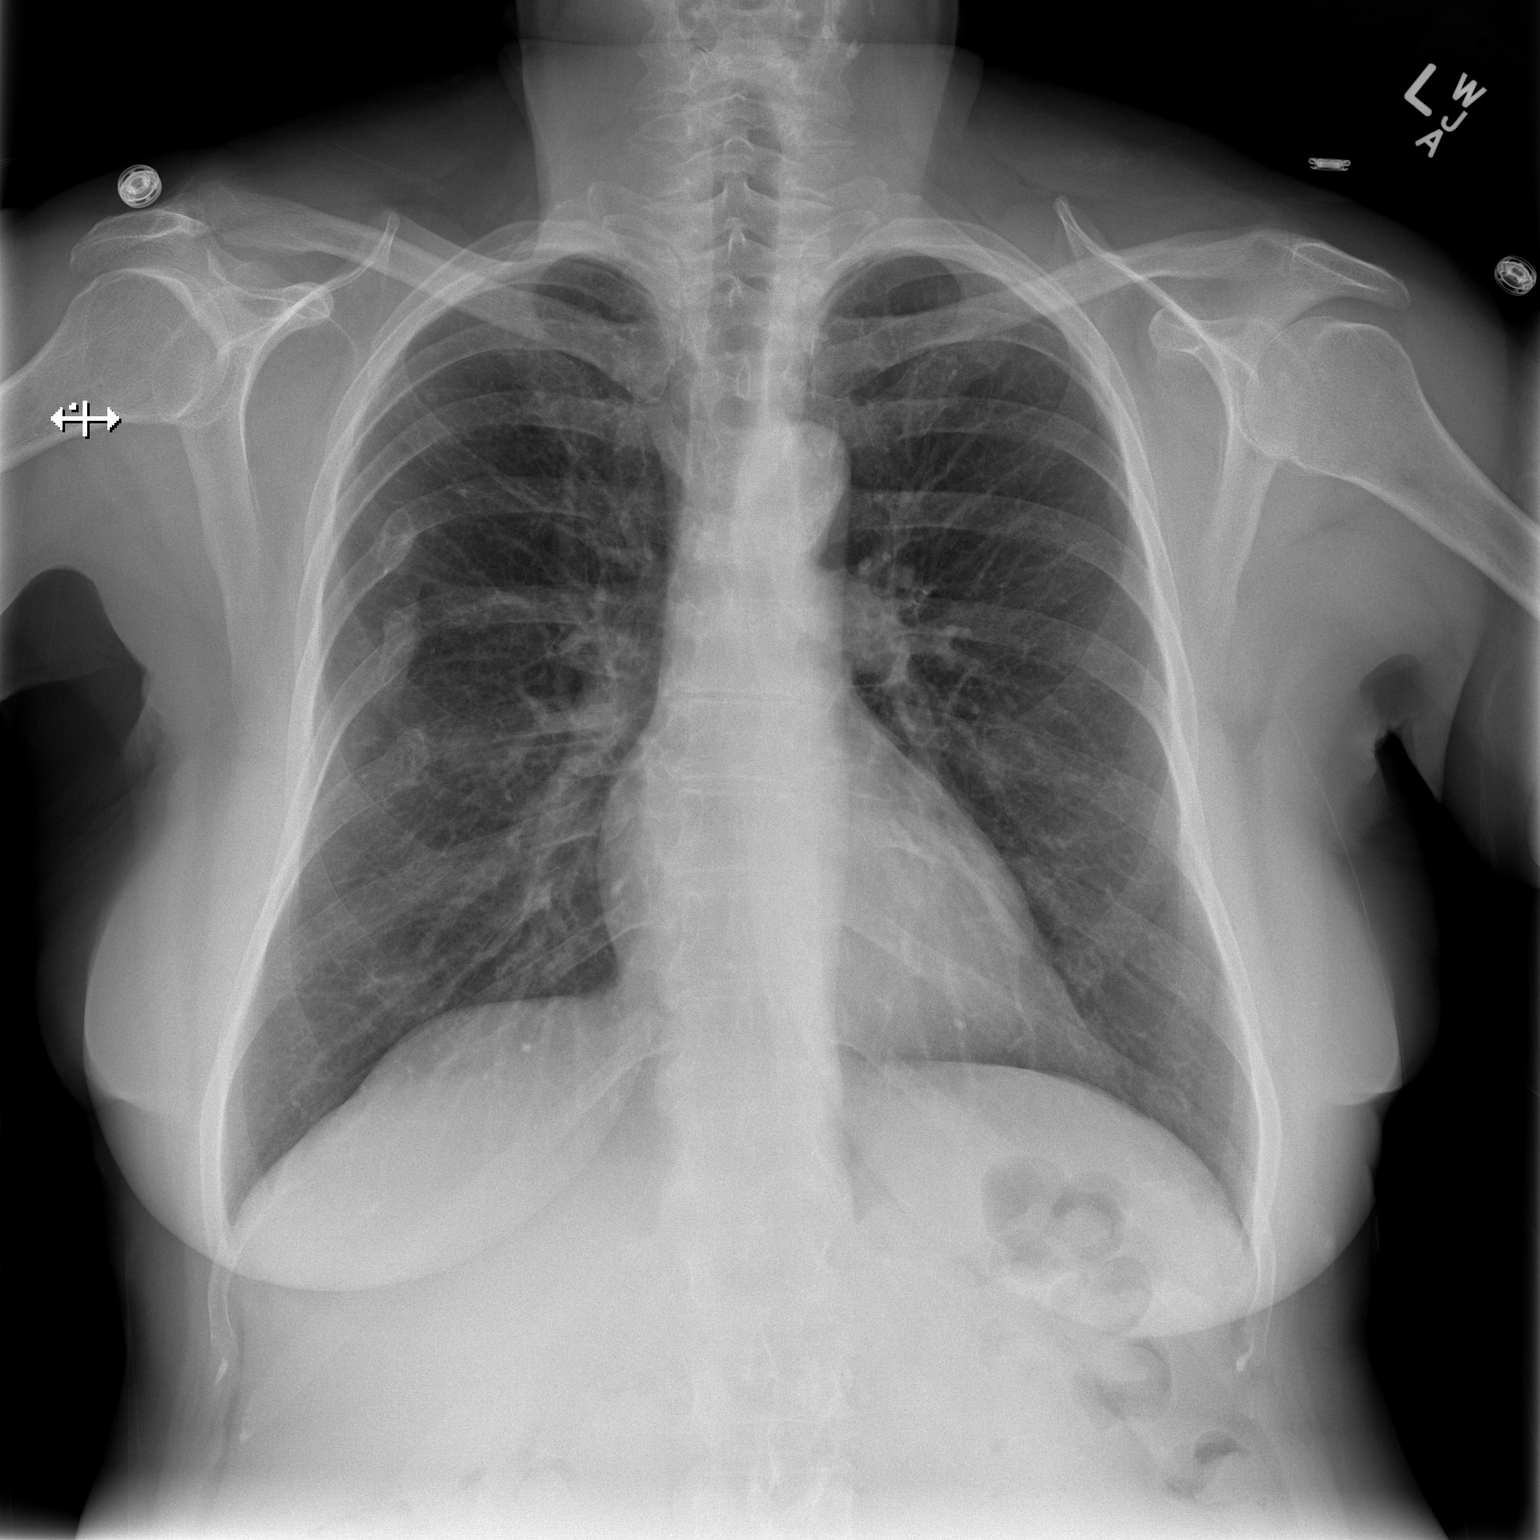

[w chest lat]
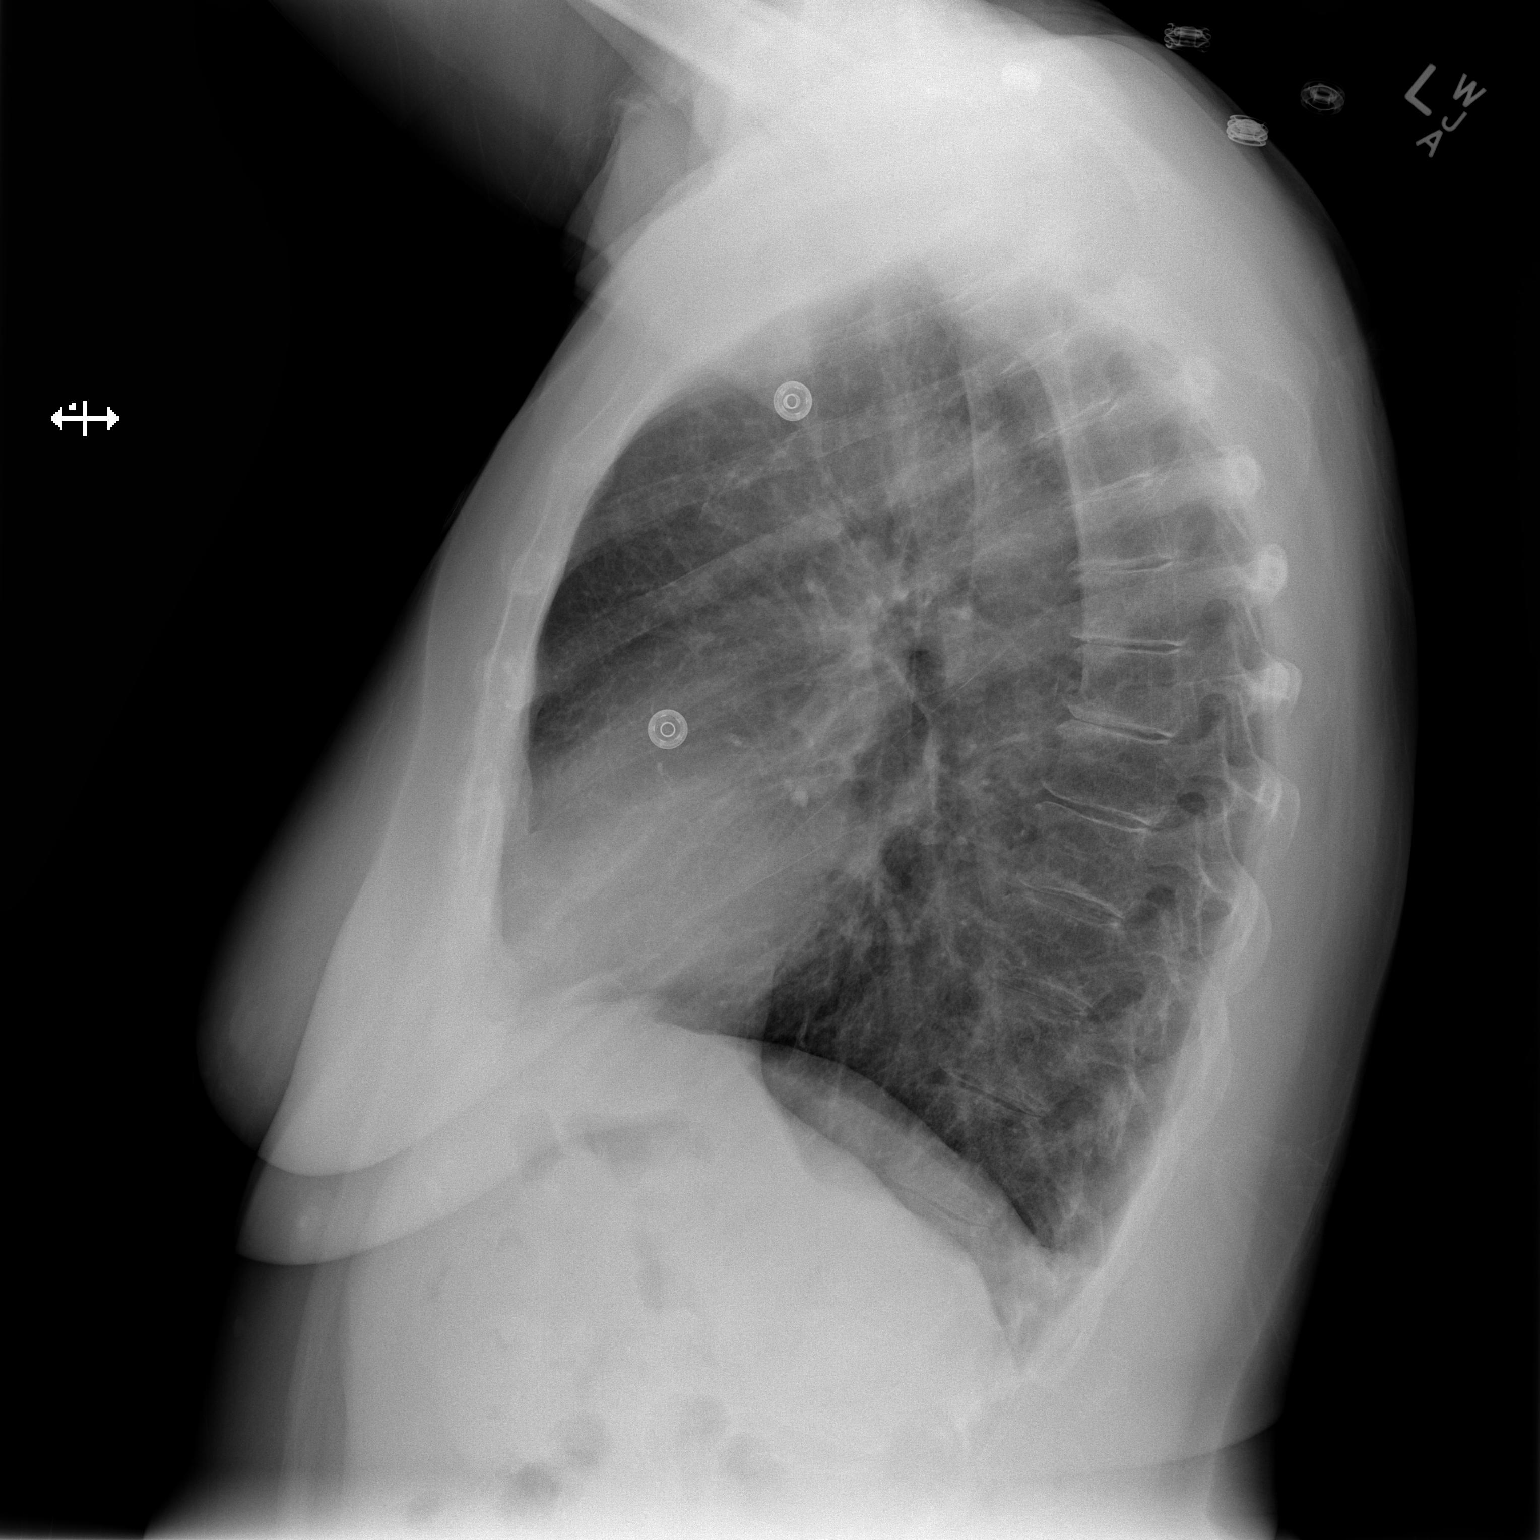

[2 of 2 positions shown; findings below may reference images not displayed]

FINDINGS: Heart size and vascularity are normal. Lungs are clear without
infiltrate or effusion. Chronic right rib fractures.
IMPRESSION: No active cardiopulmonary disease.

## 2016-04-11 ENCOUNTER — Ambulatory Visit (INDEPENDENT_AMBULATORY_CARE_PROVIDER_SITE_OTHER): Payer: Medicaid Other | Admitting: Sports Medicine

## 2016-04-11 ENCOUNTER — Encounter: Payer: Self-pay | Admitting: Sports Medicine

## 2016-04-11 DIAGNOSIS — M79673 Pain in unspecified foot: Secondary | ICD-10-CM

## 2016-04-11 DIAGNOSIS — M792 Neuralgia and neuritis, unspecified: Secondary | ICD-10-CM

## 2016-04-11 DIAGNOSIS — M2040 Other hammer toe(s) (acquired), unspecified foot: Secondary | ICD-10-CM

## 2016-04-11 DIAGNOSIS — Z9889 Other specified postprocedural states: Secondary | ICD-10-CM

## 2016-04-11 MED ORDER — GABAPENTIN 100 MG PO CAPS: 300.0000 mg | ORAL_CAPSULE | Freq: Every day | ORAL | Status: DC

## 2016-04-11 NOTE — Patient Instructions (Signed)
Pre-Operative Instructions  Congratulations, you have decided to take an important step to improving your quality of life.  You can be assured that the doctors of [facility] will be with you every step of the way.  1. Plan to be at the surgery center/hospital at least 1 (one) hour prior to your scheduled time unless otherwise directed by the surgical center/hospital staff.  You must have a responsible adult accompany you, remain during the surgery and drive you home.  Make sure you have directions to the surgical center/hospital and know how to get there on time. 2. For hospital based surgery you will need to obtain a history and physical form from your family physician within 1 month prior to the date of surgery- we will give you a form for you primary physician.  3. We make every effort to accommodate the date you request for surgery.  There are however, times where surgery dates or times have to be moved.  We will contact you as soon as possible if a change in schedule is required.   4. No Aspirin/Ibuprofen for one week before surgery.  If you are on aspirin, any non-steroidal anti-inflammatory medications (Mobic, Aleve, Ibuprofen) you should stop taking it 7 days prior to your surgery.  You make take Tylenol  For pain prior to surgery.  5. Medications- If you are taking daily heart and blood pressure medications, seizure, reflux, allergy, asthma, anxiety, pain or diabetes medications, make sure the surgery center/hospital is aware before the day of surgery so they may notify you which medications to take or avoid the day of surgery. 6. No food or drink after midnight the night before surgery unless directed otherwise by surgical center/hospital staff. 7. No alcoholic beverages 24 hours prior to surgery.  No smoking 24 hours prior to or 24 hours after surgery. 8. Wear loose pants or shorts- loose enough to fit over bandages, boots, and casts. 9. No slip on shoes, sneakers are best. 10. Bring  your boot with you to the surgery center/hospital.  Also bring crutches or a walker if your physician has prescribed it for you.  If you do not have this equipment, it will be provided for you after surgery. 11. If you have not been contracted by the surgery center/hospital by the day before your surgery, call to confirm the date and time of your surgery. 12. Leave-time from work may vary depending on the type of surgery you have.  Appropriate arrangements should be made prior to surgery with your employer. 13. Prescriptions will be provided immediately following surgery by your doctor.  Have these filled as soon as possible after surgery and take the medication as directed. 14. Remove nail polish on the operative foot. 15. Wash the night before surgery.  The night before surgery wash the foot and leg well with the antibacterial soap provided and water paying special attention to beneath the toenails and in between the toes.  Rinse thoroughly with water and dry well with a towel.  Perform this wash unless told not to do so by your physician.  Enclosed: 1 Ice pack (please put in freezer the night before surgery)   1 Hibiclens skin cleaner   Pre-op Instructions  If you have any questions regarding the instructions, do not hesitate to call our office.  [place]: [address] [place], [place] [address] [phone]  [place]: [address] [place], [place] [address] [phone]  [place]: [address]  [place], [place] [address] [phone]  Dr. Richard   Tuchman DPM, Dr. Norman Regal DPM Dr. Richard Sikora DPM, Dr. M. Todd Hyatt DPM, Dr. Jax Abdelrahman DPM 

## 2016-04-12 NOTE — Progress Notes (Signed)
Patient ID: Hailey Patterson, female   DOB: October 08, 1956, 60 y.o.   MRN: [id number]  Subjective: Hailey Patterson is a 60 y.o. female patient seen today in office for POV #4 (DOS 02-04-16), S/P partial ostectomy of left hallux and excision of soft tissue lesions plantar surfaces of both feet. Patient admits to mild pain at surgical sites; denies calf pain, denies headache, chest pain, shortness of breath, nausea, vomiting, fever, or chills. Admits that she is concerned with the changes at the right 2nd and now 4th hammertoe and wants it corrected. No other issues noted.   Patient Active Problem List   Diagnosis Date Noted  . Carpal tunnel syndrome 07/03/2015  . Abnormal breast finding 04/06/2015  . Itch of skin 04/06/2015  . General patient noncompliance 02/22/2015  . Bunion 09/08/2014  . Bilateral cataracts 09/07/2014  . Malignant neoplasm of breast ([place]) 09/07/2014  . Chest pain 07/25/2014  . Current tobacco use 10/14/2013  . 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 docusate sodium (COLACE) 100 MG capsule Take 100 mg by mouth daily as needed for mild constipation.    Marland Kitchen oxyCODONE-acetaminophen (PERCOCET) 10-325 MG tablet Take 1 tablet by mouth every 4 (four) hours as needed for pain. (Patient not taking: Reported on 03/03/2016) 30 tablet 0  . promethazine (PHENERGAN) 25 MG tablet Take 1 tablet (25 mg total) by mouth every 8 (eight) hours as needed for nausea or vomiting. (Patient not taking: Reported on 03/03/2016) 20 tablet 0   No current facility-administered medications on file prior to visit.   Allergies  Allergen Reactions  . Macrobid [Nitrofurantoin] Shortness Of Breath and Nausea And Vomiting  . Codeine Nausea And Vomiting  . Meperidine Nausea And Vomiting  . Morphine Nausea And Vomiting     Objective: General: No acute distress, AAOx3  Right & Left foot:  Incisions healed with mild reactive keratosis, no erythema, no warmth, no drainage, no signs of infection noted, Capillary fill time <3 seconds in all digits, gross sensation present via light touch to right and left foot.Subjective tingling to toes with difficulty sleeping at night. No pain or crepitation with range of motion right and left foot. 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 No pain with calf compression bilateral.   Assessment and Plan:  Problem List Items Addressed This Visit    None    Visit Diagnoses    Status post surgery of both feet    -  Primary    Foot pain, unspecified laterality        Neuritis        Relevant Medications    gabapentin (NEURONTIN) 100 MG capsule    Hammertoe, unspecified laterality          -Patient seen and evaluated -Recommend good supportive shoes daily -Rx Gabapentin qhs for subjective tingling  -Epsom salt soaks and ice as needed -Dispensed Darco digital toe alignment splint to use on right foot -Patient opt for surgical management. Consent obtain for right 2nd and 4th hammertoe revision with k-wire. Pre and Post op course explained. Risks, benefits, alternatives explained. No guarantees given or implied. Surgical booking slip submitted and provided patient with Surgical packet and info for [place]. -Patient to use surgical shoe post op of which she owns -Patient  to return after surgery for follow up evaluation. In the meantime, patient to call office if any issues or problems arise.   Landis Martins, DPM

## 2016-04-14 ENCOUNTER — Telehealth: Payer: Self-pay | Admitting: *Deleted

## 2016-04-14 NOTE — Telephone Encounter (Signed)
"  I was told to call you to schedule my surgery."  Dr. Cannon Kettle does surgery on Mondays.  Do you have a date in mind.  "Today is Monday so, can we do it next week?"  She doesn't have anything next Monday.  She can do it on May 8.  "That will be fine.  Will you call me with the time?"  Someone from the surgical center will give you a call with the arrival time.  You will need to go on-line and register with the surgical center.  If you don't have access to a computer, someone will call you from the surgical center to get information.

## 2016-04-28 ENCOUNTER — Encounter: Payer: Self-pay | Admitting: Sports Medicine

## 2016-04-28 DIAGNOSIS — M2041 Other hammer toe(s) (acquired), right foot: Secondary | ICD-10-CM | POA: Diagnosis not present

## 2016-04-28 DIAGNOSIS — D492 Neoplasm of unspecified behavior of bone, soft tissue, and skin: Secondary | ICD-10-CM | POA: Diagnosis not present

## 2016-04-29 ENCOUNTER — Telehealth: Payer: Self-pay | Admitting: Sports Medicine

## 2016-04-29 ENCOUNTER — Telehealth: Payer: Self-pay | Admitting: *Deleted

## 2016-04-29 NOTE — Telephone Encounter (Signed)
Post op courtesy call-I spoke with pt's mtr, Pincus Sanes, she stated pt was asleep.  I instructed her to have pt to follow the post op instructions given to her after the surgery, not to be up on the foot or dangling the foot more than 15 mins/hour, take the pain medication as directed, remain in the surgical boot at all times even sleeping, leave the original surgical dressing in place, and call with concerns.  Posey Pronto states understanding and will have pt call with questions.

## 2016-04-29 NOTE — Telephone Encounter (Signed)
Post op phone call made to patient. Patient states that her foot is doing great. No pain. Block is still working. Denies any constitutional symptoms. No other issues. Patient to follow up as scheduled for post-op care -Dr. Cannon Kettle

## 2016-05-02 NOTE — Progress Notes (Signed)
DOS 04/28/2016 Right 2nd and 4th hammer toe revision with k-wire.

## 2016-05-06 ENCOUNTER — Ambulatory Visit: Payer: Medicaid Other

## 2016-05-06 ENCOUNTER — Ambulatory Visit (INDEPENDENT_AMBULATORY_CARE_PROVIDER_SITE_OTHER): Payer: Medicaid Other | Admitting: Sports Medicine

## 2016-05-06 ENCOUNTER — Encounter: Payer: Self-pay | Admitting: Sports Medicine

## 2016-05-06 DIAGNOSIS — M2041 Other hammer toe(s) (acquired), right foot: Secondary | ICD-10-CM

## 2016-05-06 DIAGNOSIS — M79673 Pain in unspecified foot: Secondary | ICD-10-CM

## 2016-05-06 DIAGNOSIS — Z9889 Other specified postprocedural states: Secondary | ICD-10-CM

## 2016-05-06 MED ORDER — OXYCODONE-ACETAMINOPHEN 10-325 MG PO TABS: 1.0000 | ORAL_TABLET | ORAL | Status: DC | PRN

## 2016-05-06 NOTE — Progress Notes (Signed)
Patient ID: Nour Feinman, female   DOB: [birth year], 60 y.o.   MRN: [id number] Subjective: Myrtice Scavetta is a 60 y.o. female patient seen today in office for POV #1 (DOS 04-28-16), S/P Right 2-4 hammertoe repair and excision of soft tissue lesion plantar surface of right foot. Patient denies pain at surgical site, denies calf pain, denies headache, chest pain, shortness of breath, nausea, vomiting, fever, or chills. Patient states that she has been more compliant this time around and doing everything that I say to do. No other issues noted.   Patient Active Problem List   Diagnosis Date Noted  . Carpal tunnel syndrome 07/03/2015  . Abnormal breast finding 04/06/2015  . Itch of skin 04/06/2015  . General patient noncompliance 02/22/2015  . Bunion 09/08/2014  . Bilateral cataracts 09/07/2014  . Malignant neoplasm of breast ([place]) 09/07/2014  . Chest pain 07/25/2014  . Current tobacco use 10/14/2013  . 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 docusate sodium (COLACE) 100 MG capsule Take 100 mg by mouth daily as needed for mild constipation.    . docusate sodium (COLACE) 100 MG capsule Take 100 mg by mouth 2 (two) times daily.    Marland Kitchen gabapentin (NEURONTIN) 100 MG capsule Take 3 capsules (300 mg total) by mouth at bedtime. 90 capsule 3  . promethazine (PHENERGAN) 25 MG tablet Take 1 tablet (25 mg total) by mouth every 8 (eight) hours as needed for nausea or vomiting. (Patient not taking: Reported on 03/03/2016) 20 tablet 0  . promethazine (PHENERGAN) 25 MG tablet Take 25 mg by mouth every 6 (six) hours as needed for nausea or vomiting.     No current facility-administered medications on file prior to visit.    Allergies  Allergen Reactions  . Macrobid [Nitrofurantoin] Shortness Of Breath and Nausea And Vomiting  . Codeine Nausea And Vomiting  . Meperidine Nausea And  Vomiting  . Morphine Nausea And Vomiting    Objective: There were no vitals filed for this visit.  General: No acute distress, AAOx3  Right foot: Sutures intact with no gapping or dehiscence at surgical sites, mild swelling to right foot, no erythema, no warmth, no drainage, no signs of infection noted, Capillary fill time <3 seconds in all digits, gross sensation present via light touch to right foot. No pain or crepitation with range of motion right foot.  No pain with calf compression.   Post Op Xray, Right foot: Excellent alignment and position. Fusion and arthroplasty sites healing. Hardware intact. Soft tissue swelling within normal limits for post op status.   Path: No malignancy   Assessment and Plan:  Problem List Items Addressed This Visit    None    Visit Diagnoses    Status post right foot surgery    -  Primary    Relevant Orders    DG Foot Complete Right    Hammer toe, right        Relevant Orders    DG Foot Complete Right    Foot pain, unspecified laterality        Relevant Medications    oxyCODONE-acetaminophen (PERCOCET) 10-325 MG tablet       -Patient seen and evaluated -X-rays reviewed -Applied dry sterile dressing to surgical site right foot secured with ACE wrap and stockinet  -Advised patient to make sure to keep dressings clean, dry, and intact to right surgical  site, removing the ACE as needed  -Advised patient to continue with post-op shoe on right foot   -Advised patient to limit activity to necessity  -Advised patient to ice and elevate as necessary  -Refilled percocet to take as needed for pain -Will plan for suture removal at next office visit. In the meantime, patient to call office if any issues or problems arise.   Landis Martins, DPM

## 2016-05-13 ENCOUNTER — Encounter: Payer: Self-pay | Admitting: Sports Medicine

## 2016-05-13 ENCOUNTER — Ambulatory Visit (INDEPENDENT_AMBULATORY_CARE_PROVIDER_SITE_OTHER): Payer: Medicaid Other | Admitting: Sports Medicine

## 2016-05-13 DIAGNOSIS — Z9889 Other specified postprocedural states: Secondary | ICD-10-CM

## 2016-05-13 DIAGNOSIS — M79671 Pain in right foot: Secondary | ICD-10-CM | POA: Diagnosis not present

## 2016-05-13 NOTE — Progress Notes (Signed)
Patient ID: Hailey Patterson, female   DOB: 11-22-1956, 60 y.o.   MRN: [id number]  Subjective: Hailey Patterson is a 60 y.o. female patient seen today in office for POV #2 (DOS 04-28-16), S/P Right 2-4 hammertoe repair and excision of soft tissue lesion plantar surface of right foot. Patient denies pain at surgical site, denies calf pain, denies headache, chest pain, shortness of breath, nausea, vomiting, fever, or chills. Patient states that she has been staying off foot as instructed and using knee scooter. No other issues noted.   Patient Active Problem List   Diagnosis Date Noted  . Carpal tunnel syndrome 07/03/2015  . Abnormal breast finding 04/06/2015  . Itch of skin 04/06/2015  . General patient noncompliance 02/22/2015  . Bunion 09/08/2014  . Bilateral cataracts 09/07/2014  . Malignant neoplasm of breast ([place]) 09/07/2014  . Chest pain 07/25/2014  . Current tobacco use 10/14/2013  . 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 docusate sodium (COLACE) 100 MG capsule Take 100 mg by mouth daily as needed for mild constipation.    . docusate sodium (COLACE) 100 MG capsule Take 100 mg by mouth 2 (two) times daily.    Marland Kitchen gabapentin (NEURONTIN) 100 MG capsule Take 3 capsules (300 mg total) by mouth at bedtime. 90 capsule 3  . oxyCODONE-acetaminophen (PERCOCET) 10-325 MG tablet Take 1 tablet by mouth every 4 (four) hours as needed for pain. 30 tablet 0  . promethazine (PHENERGAN) 25 MG tablet Take 1 tablet (25 mg total) by mouth every 8 (eight) hours as needed for nausea or vomiting. (Patient not taking: Reported on 03/03/2016) 20 tablet 0  . promethazine (PHENERGAN) 25 MG tablet Take 25 mg by mouth every 6 (six) hours as needed for nausea or vomiting.     No current facility-administered medications on file prior to visit.    Allergies  Allergen Reactions  . Macrobid  [Nitrofurantoin] Shortness Of Breath and Nausea And Vomiting  . Codeine Nausea And Vomiting  . Meperidine Nausea And Vomiting  . Morphine Nausea And Vomiting    Objective: There were no vitals filed for this visit.  General: No acute distress, AAOx3  Right foot: Sutures intact with no gapping or dehiscence at surgical sites, mild swelling to right foot, no erythema, no warmth, no drainage, no signs of infection noted, Capillary fill time <3 seconds in all digits, gross sensation present via light touch to right foot. No pain or crepitation with range of motion right foot.  No pain with calf compression.   Assessment and Plan:  Problem List Items Addressed This Visit    None    Visit Diagnoses    Status post right foot surgery    -  Primary    Right foot pain           -Patient seen and evaluated -Sutures removed. Steri-strips applied covered with ACE -Patient may shower as normal allowing strips to fall off on their own and ACE wrap re-apply daily -Advised patient to weightbear now with CAM boot as dispensed today -Advised patient to limit activity to necessity  -Advised patient to ice and elevate as necessary  -Cont with percocet to take as needed for pain -Will plan transition from [place] at next visit in 2 weeks. In the meantime, patient to call office if any issues or problems arise.   Landis Martins, DPM

## 2016-05-27 ENCOUNTER — Telehealth: Payer: Self-pay | Admitting: Sports Medicine

## 2016-05-27 NOTE — Telephone Encounter (Signed)
Advised patient to use rolling knee scooter, PO pain medication, ice, elevate, refrain from shoe or boot right now since the foot is painful. Advised patient the more she stands or walks it will put pressure on the foot which will lead to swelling and aggravation of the nerves. I advised patient to remove the steri-strips from the toe surgical sites and if pain is not improved to come in before her Friday appt for evaluation.

## 2016-05-30 ENCOUNTER — Ambulatory Visit (INDEPENDENT_AMBULATORY_CARE_PROVIDER_SITE_OTHER): Payer: Medicaid Other

## 2016-05-30 ENCOUNTER — Ambulatory Visit (INDEPENDENT_AMBULATORY_CARE_PROVIDER_SITE_OTHER): Payer: Medicaid Other | Admitting: Sports Medicine

## 2016-05-30 ENCOUNTER — Encounter: Payer: Self-pay | Admitting: Sports Medicine

## 2016-05-30 DIAGNOSIS — M7989 Other specified soft tissue disorders: Secondary | ICD-10-CM

## 2016-05-30 DIAGNOSIS — Z9889 Other specified postprocedural states: Secondary | ICD-10-CM

## 2016-05-30 DIAGNOSIS — M79673 Pain in unspecified foot: Secondary | ICD-10-CM

## 2016-05-30 DIAGNOSIS — M2041 Other hammer toe(s) (acquired), right foot: Secondary | ICD-10-CM

## 2016-05-30 DIAGNOSIS — M79671 Pain in right foot: Secondary | ICD-10-CM

## 2016-05-30 MED ORDER — OXYCODONE-ACETAMINOPHEN 10-325 MG PO TABS: 1.0000 | ORAL_TABLET | ORAL | Status: DC | PRN

## 2016-05-30 NOTE — Progress Notes (Signed)
Patient ID: Hailey Patterson, female   DOB: 1956/12/14, 60 y.o.   MRN: [id number]  Subjective: Hailey Patterson is a 60 y.o. female patient seen today in office for POV #3 (DOS 04-28-16), S/P Right 2-4 hammertoe repair and excision of soft tissue lesion plantar surface of right foot. Patient admits to terrible pain at surgical sites, denies calf pain, denies headache, chest pain, shortness of breath, nausea, vomiting, fever, or chills. Patient states that she has been staying off foot as instructed and using knee scooter, she can not wear boot or allow anything to touch her foot because it hurts so badly. No other issues noted.   Patient Active Problem List   Diagnosis Date Noted  . Carpal tunnel syndrome 07/03/2015  . Abnormal breast finding 04/06/2015  . Itch of skin 04/06/2015  . General patient noncompliance 02/22/2015  . Bunion 09/08/2014  . Bilateral cataracts 09/07/2014  . Malignant neoplasm of breast ([place]) 09/07/2014  . Chest pain 07/25/2014  . Current tobacco use 10/14/2013  . 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 docusate sodium (COLACE) 100 MG capsule Take 100 mg by mouth daily as needed for mild constipation.    . docusate sodium (COLACE) 100 MG capsule Take 100 mg by mouth 2 (two) times daily.    Marland Kitchen gabapentin (NEURONTIN) 100 MG capsule Take 3 capsules (300 mg total) by mouth at bedtime. 90 capsule 3  . promethazine (PHENERGAN) 25 MG tablet Take 1 tablet (25 mg total) by mouth every 8 (eight) hours as needed for nausea or vomiting. (Patient not taking: Reported on 03/03/2016) 20 tablet 0  . promethazine (PHENERGAN) 25 MG tablet Take 25 mg by mouth every 6 (six) hours as needed for nausea or vomiting.     No current facility-administered medications on file prior to visit.    Allergies  Allergen Reactions  . Macrobid [Nitrofurantoin] Shortness Of Breath  and Nausea And Vomiting  . Codeine Nausea And Vomiting  . Meperidine Nausea And Vomiting  . Morphine Nausea And Vomiting    Objective: There were no vitals filed for this visit.  General: No acute distress, AAOx3  Right foot: Incisions intact with no gapping or dehiscence at surgical sites, mild swelling to right foot, no erythema, no warmth, no drainage, no signs of infection noted, Capillary fill time <3 seconds in all digits, gross sensation present via light touch to right foot however patient is extremely hypersensitive.  No pain with calf compression.   GPA- Path intra-op; no malignancy callus with dermal fibrouses  Xray, right foot: Hardware remains intact. No acute findings.   Assessment and Plan:  Problem List Items Addressed This Visit    None    Visit Diagnoses    Status post right foot surgery    -  Primary    Relevant Orders    DG Foot Complete Right    Right foot pain        Relevant Orders    DG Foot Complete Right    Hammer toe, right        Relevant Orders    DG Foot Complete Right    Foot pain, unspecified laterality        Relevant Medications    oxyCODONE-acetaminophen (PERCOCET) 10-325 MG tablet    Foot swelling           -Patient seen and evaluated -Applied unna boot  to right foot to keep clean, dry, intact for 5 days or as long as patient can tolerate it -Advised patient to limit activity to necessity  -Advised patient to ice and elevate as necessary  -Advised patient to take her Gabapentin for pain since she is displaying hypersensitivity out of proportion  -Cont with percocet to take as needed for pain; refill provided -Will plan transition from Boot if patient has been wearing it at next visit in 2 weeks. In the meantime, patient to call office if any issues or problems arise.   Landis Martins, DPM

## 2016-06-03 ENCOUNTER — Encounter: Payer: Self-pay | Admitting: Sports Medicine

## 2016-06-13 ENCOUNTER — Encounter: Payer: Self-pay | Admitting: Sports Medicine

## 2016-06-13 ENCOUNTER — Ambulatory Visit (INDEPENDENT_AMBULATORY_CARE_PROVIDER_SITE_OTHER): Payer: Medicaid Other | Admitting: Sports Medicine

## 2016-06-13 DIAGNOSIS — M79671 Pain in right foot: Secondary | ICD-10-CM

## 2016-06-13 DIAGNOSIS — Z9889 Other specified postprocedural states: Secondary | ICD-10-CM

## 2016-06-13 NOTE — Progress Notes (Addendum)
Patient ID: Hailey Patterson, female   DOB: Sep 25, 1956, 60 y.o.   MRN: [id number]  Subjective: Hailey Patterson is a 60 y.o. female patient seen today in office for POV #4 (DOS 04-28-16), S/P Right 2-4 hammertoe repair and excision of soft tissue lesion plantar surface of right foot. Patient admits to  pain at surgical sites that is better than last visit; has been able to wear post op shoe now, denies calf pain, denies headache, chest pain, shortness of breath, nausea, vomiting, fever, or chills. Patient states that her toes are still swollen and she is wondering how long the swelling will stay this way. No other issues noted.   Patient Active Problem List   Diagnosis Date Noted  . Carpal tunnel syndrome 07/03/2015  . Abnormal breast finding 04/06/2015  . Itch of skin 04/06/2015  . General patient noncompliance 02/22/2015  . Bunion 09/08/2014  . Bilateral cataracts 09/07/2014  . Malignant neoplasm of breast ([place]) 09/07/2014  . Chest pain 07/25/2014  . Current tobacco use 10/14/2013  . 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 docusate sodium (COLACE) 100 MG capsule Take 100 mg by mouth daily as needed for mild constipation.    . docusate sodium (COLACE) 100 MG capsule Take 100 mg by mouth 2 (two) times daily.    Marland Kitchen gabapentin (NEURONTIN) 100 MG capsule Take 3 capsules (300 mg total) by mouth at bedtime. 90 capsule 3  . oxyCODONE-acetaminophen (PERCOCET) 10-325 MG tablet Take 1 tablet by mouth every 4 (four) hours as needed for pain. 30 tablet 0  . promethazine (PHENERGAN) 25 MG tablet Take 1 tablet (25 mg total) by mouth every 8 (eight) hours as needed for nausea or vomiting. (Patient not taking: Reported on 03/03/2016) 20 tablet 0  . promethazine (PHENERGAN) 25 MG tablet Take 25 mg by mouth every 6 (six) hours as needed for nausea or vomiting.     No current  facility-administered medications on file prior to visit.    Allergies  Allergen Reactions  . Macrobid [Nitrofurantoin] Shortness Of Breath and Nausea And Vomiting  . Codeine Nausea And Vomiting  . Meperidine Nausea And Vomiting  . Morphine Nausea And Vomiting    Objective: There were no vitals filed for this visit.  General: No acute distress, AAOx3  Right foot: Incisions Healing well with no gapping or dehiscence at surgical sites, mild swelling to right foot at level of digits, no erythema, no warmth, no drainage, no signs of infection noted, Capillary fill time <3 seconds in all digits, gross sensation present via light touch to right foot however patient is extremely hypersensitive which appears somewhat improved since last encounter.  No pain with calf compression.   Assessment and Plan:  Problem List Items Addressed This Visit    None    Visit Diagnoses    Status post right foot surgery    -  Primary    Right foot pain           -Patient seen and evaluated -Patient may transition from postop shoe to normal tendon shoe as tolerated -Advised patient to perform toe exercises and to gently massage incision sites to prevent scar contracture which could possibly happening at the right fourth toe. There is slight elevation at this digit. -Advised patient to limit activity to necessity  -Advised patient to ice and elevate as necessary  -Advised patient to continue  to take her Gabapentin for pain since she is displaying hypersensitivity out of proportion  -Cont with percocet to take as needed for pain -Patient to return in 1 month for follow-up examination. In the meantime, patient to call office if any issues or problems arise. To Xray at next visit.   Landis Martins, DPM

## 2016-07-11 ENCOUNTER — Ambulatory Visit (INDEPENDENT_AMBULATORY_CARE_PROVIDER_SITE_OTHER): Payer: Medicaid Other

## 2016-07-11 ENCOUNTER — Ambulatory Visit (INDEPENDENT_AMBULATORY_CARE_PROVIDER_SITE_OTHER): Payer: Medicaid Other | Admitting: Sports Medicine

## 2016-07-11 ENCOUNTER — Encounter: Payer: Self-pay | Admitting: Sports Medicine

## 2016-07-11 DIAGNOSIS — M79671 Pain in right foot: Secondary | ICD-10-CM

## 2016-07-11 DIAGNOSIS — Z9889 Other specified postprocedural states: Secondary | ICD-10-CM

## 2016-07-11 DIAGNOSIS — M21542 Acquired clubfoot, left foot: Secondary | ICD-10-CM | POA: Diagnosis not present

## 2016-07-11 DIAGNOSIS — M2041 Other hammer toe(s) (acquired), right foot: Secondary | ICD-10-CM

## 2016-07-12 NOTE — Progress Notes (Addendum)
Patient ID: Hailey Patterson, female   DOB: March 08, 1956, 60 y.o.   MRN: [id number] Subjective: Hailey Patterson is a 60 y.o. female patient seen today in office for POV #5 (DOS 04-28-16), S/P Right 2-4 hammertoe repair and excision of soft tissue lesion plantar surface of right foot. Patient admits to pain that is getting better at surgical sites; has been able to wear tennis shoe now, denies calf pain, denies headache, chest pain, shortness of breath, nausea, vomiting, fever, or chills. Patient states that she knows when she has done too much because her foot will hurt. No other issues noted.   Patient Active Problem List   Diagnosis Date Noted  . Carpal tunnel syndrome 07/03/2015  . Abnormal breast finding 04/06/2015  . Itch of skin 04/06/2015  . General patient noncompliance 02/22/2015  . Bunion 09/08/2014  . Bilateral cataracts 09/07/2014  . Malignant neoplasm of breast ([place]) 09/07/2014  . Chest pain 07/25/2014  . Current tobacco use 10/14/2013  . 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 docusate sodium (COLACE) 100 MG capsule Take 100 mg by mouth daily as needed for mild constipation.    . docusate sodium (COLACE) 100 MG capsule Take 100 mg by mouth 2 (two) times daily.    Marland Kitchen gabapentin (NEURONTIN) 100 MG capsule Take 3 capsules (300 mg total) by mouth at bedtime. 90 capsule 3  . oxyCODONE-acetaminophen (PERCOCET) 10-325 MG tablet Take 1 tablet by mouth every 4 (four) hours as needed for pain. 30 tablet 0  . promethazine (PHENERGAN) 25 MG tablet Take 1 tablet (25 mg total) by mouth every 8 (eight) hours as needed for nausea or vomiting. (Patient not taking: Reported on 03/03/2016) 20 tablet 0  . promethazine (PHENERGAN) 25 MG tablet Take 25 mg by mouth every 6 (six) hours as needed for nausea or vomiting.     No current facility-administered medications on file prior to  visit.    Allergies  Allergen Reactions  . Macrobid [Nitrofurantoin] Shortness Of Breath and Nausea And Vomiting  . Codeine Nausea And Vomiting  . Meperidine Nausea And Vomiting  . Morphine Nausea And Vomiting    Objective: There were no vitals filed for this visit.  General: No acute distress, AAOx3  Right foot: Incisions Healing well with no gapping or dehiscence at surgical sites, mild scar contracture and swelling to right foot at level of digits, no erythema, no warmth, no drainage, no signs of infection noted, Capillary fill time <3 seconds in all digits, gross sensation present via light touch to right foot however patient is extremely hypersensitive which appears somewhat improved since prior encounter. Mild elevation of 2nd and 4th toes.  No pain with calf compression.   Xray right foot hardware intact, consistent with post op status  Assessment and Plan:  Problem List Items Addressed This Visit    None    Visit Diagnoses    Right foot pain    -  Primary    Relevant Orders    DG Foot Complete Right    Hammer toe, right        Status post right foot surgery           -Patient seen and evaluated -Continue with normal shoe as tolerated -Advised patient to perform toe exercises and to gently massage incision sites to prevent scar contracture which could possibly happening at the right fourth toe  and 2nd toe. There is slight elevation at these digits. Recommend steroid injection if no improvement. -Advised patient to limit activity to tplerance -Advised patient to ice and elevate as necessary  -Advised patient to continue to take her Gabapentin for pain as needed -Cont with percocet to take as needed for pain -Patient to return in 5-6 weeks for follow-up examination. In the meantime, patient to call office if any issues or problems arise. Temporary handicap for 6 months given.  Landis Martins, DPM

## 2016-08-17 DIAGNOSIS — E663 Overweight: Secondary | ICD-10-CM | POA: Insufficient documentation

## 2016-08-17 DIAGNOSIS — Z6829 Body mass index (BMI) 29.0-29.9, adult: Secondary | ICD-10-CM | POA: Insufficient documentation

## 2016-08-19 ENCOUNTER — Encounter: Payer: Self-pay | Admitting: Sports Medicine

## 2016-08-19 ENCOUNTER — Ambulatory Visit (INDEPENDENT_AMBULATORY_CARE_PROVIDER_SITE_OTHER): Payer: Medicaid Other | Admitting: Sports Medicine

## 2016-08-19 DIAGNOSIS — Z9889 Other specified postprocedural states: Secondary | ICD-10-CM

## 2016-08-19 DIAGNOSIS — M21619 Bunion of unspecified foot: Secondary | ICD-10-CM

## 2016-08-19 DIAGNOSIS — M2041 Other hammer toe(s) (acquired), right foot: Secondary | ICD-10-CM

## 2016-08-19 DIAGNOSIS — M216X9 Other acquired deformities of unspecified foot: Secondary | ICD-10-CM | POA: Diagnosis not present

## 2016-08-19 DIAGNOSIS — L905 Scar conditions and fibrosis of skin: Secondary | ICD-10-CM

## 2016-08-19 DIAGNOSIS — M79671 Pain in right foot: Secondary | ICD-10-CM

## 2016-08-19 MED ORDER — TRIAMCINOLONE ACETONIDE 40 MG/ML IJ SUSP: 20.0000 mg | Freq: Once | INTRAMUSCULAR | Status: DC

## 2016-08-19 NOTE — Progress Notes (Signed)
Patient ID: Oriya Kettering, female   DOB: 05-01-1956, 60 y.o.   MRN: [id number] Subjective: Graclyn Lawther is a 60 y.o. female patient seen today in office for POV #6 (DOS 04-28-16), S/P Right 2-4 hammertoe repair and excision of soft tissue lesion plantar surface of right foot. Patient admits to pain at bunion and sub met 3 where keratosis was excised, "feels sore and like there is bone at the area", denies calf pain, denies headache, chest pain, shortness of breath, nausea, vomiting, fever, or chills. Patient states that she knows when she has done too much because her foot will hurt sometimes. No other issues noted.   Patient Active Problem List   Diagnosis Date Noted  . Carpal tunnel syndrome 07/03/2015  . Abnormal breast finding 04/06/2015  . Itch of skin 04/06/2015  . General patient noncompliance 02/22/2015  . Bunion 09/08/2014  . Bilateral cataracts 09/07/2014  . Malignant neoplasm of breast ([place]) 09/07/2014  . Chest pain 07/25/2014  . Current tobacco use 10/14/2013  . 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 docusate sodium (COLACE) 100 MG capsule Take 100 mg by mouth daily as needed for mild constipation.    . docusate sodium (COLACE) 100 MG capsule Take 100 mg by mouth 2 (two) times daily.    Marland Kitchen gabapentin (NEURONTIN) 100 MG capsule Take 3 capsules (300 mg total) by mouth at bedtime. 90 capsule 3  . oxyCODONE-acetaminophen (PERCOCET) 10-325 MG tablet Take 1 tablet by mouth every 4 (four) hours as needed for pain. 30 tablet 0  . promethazine (PHENERGAN) 25 MG tablet Take 1 tablet (25 mg total) by mouth every 8 (eight) hours as needed for nausea or vomiting. (Patient not taking: Reported on 03/03/2016) 20 tablet 0  . promethazine (PHENERGAN) 25 MG tablet Take 25 mg by mouth every 6 (six) hours as needed for nausea or vomiting.     No current  facility-administered medications on file prior to visit.     Allergies  Allergen Reactions  . Macrobid [Nitrofurantoin] Shortness Of Breath and Nausea And Vomiting  . Sulfa Antibiotics Nausea And Vomiting  . Codeine Nausea And Vomiting  . Meperidine Nausea And Vomiting  . Morphine Nausea And Vomiting    Objective: There were no vitals filed for this visit.  General: No acute distress, AAOx3  Right foot: Incisions well healed at surgical sites, mild scar contracture and swelling to right foot at level of digits, no erythema, no warmth, no drainage, no signs of infection noted, Capillary fill time <3 seconds in all digits, gross sensation present via light touch to right foot however patient is painful sub met 3 and at bunion. Mild elevation of 2nd and 4th toes, slightly improved from prior.  No pain with calf compression.   Assessment and Plan:  Problem List Items Addressed This Visit      Musculoskeletal and Integument   Bunion   Relevant Medications   triamcinolone acetonide (KENALOG-40) injection 20 mg    Other Visit Diagnoses    Status post right foot surgery    -  Primary   Hammer toe, right       Relevant Medications   triamcinolone acetonide (KENALOG-40) injection 20 mg   Scar tissue       Right foot pain          -Patient seen and evaluated -Continue with normal shoe as tolerated -  Advised patient to perform toe exercises and to gently massage incision sites to prevent scar contracture which could possibly happening at the right fourth toe and 2nd toe. There is slight elevation at these digits. -After oral consent and aseptic prep, injected a mixture containing 1 ml of 2% plain lidocaine, 1 ml 0.5% plain marcaine, 0.5 ml of kenalog 40 and 0.5 ml of dexamethasone phosphate into right 1st MTPJ and sub met 3 without complication. Post-injection care discussed with patient.  -Advised patient to limit activity to tolerance -Advised patient to ice and elevate as necessary   -Advised patient to continue to take her Gabapentin for pain as needed -Patient to return as needed follow-up examination and further discussion of residual bunion and possible surgery. In the meantime, patient to call office if any issues or problems arise.  Landis Martins, DPM

## 2016-09-03 ENCOUNTER — Telehealth: Payer: Self-pay | Admitting: *Deleted

## 2016-09-03 NOTE — Telephone Encounter (Signed)
"  I would like to discuss when I can get my foot surgery done with Dr. Cannon Kettle.  Please give me a call back.  If you cannot reach me on my cell phone call me on the house number after 5 pm.  Thank you."

## 2016-09-05 NOTE — Telephone Encounter (Signed)
I am returning your call.  Have you signed consent forms for surgery?  "No, I have not.  How do I go about doing that?"  You will need to schedule an appointment to see her.  I can transfer you to a scheduler to make an appointment if you would like.  "That is fine, thank you."  Dr. Leeanne Rio schedule is open right now for surgery.

## 2016-09-11 ENCOUNTER — Ambulatory Visit (INDEPENDENT_AMBULATORY_CARE_PROVIDER_SITE_OTHER): Payer: Medicaid Other | Admitting: Sports Medicine

## 2016-09-11 ENCOUNTER — Telehealth: Payer: Self-pay | Admitting: *Deleted

## 2016-09-11 ENCOUNTER — Encounter (INDEPENDENT_AMBULATORY_CARE_PROVIDER_SITE_OTHER): Payer: Self-pay

## 2016-09-11 ENCOUNTER — Encounter: Payer: Self-pay | Admitting: Sports Medicine

## 2016-09-11 DIAGNOSIS — M21619 Bunion of unspecified foot: Secondary | ICD-10-CM | POA: Diagnosis not present

## 2016-09-11 DIAGNOSIS — L84 Corns and callosities: Secondary | ICD-10-CM

## 2016-09-11 DIAGNOSIS — M216X1 Other acquired deformities of right foot: Secondary | ICD-10-CM

## 2016-09-11 DIAGNOSIS — Z01818 Encounter for other preprocedural examination: Secondary | ICD-10-CM | POA: Diagnosis not present

## 2016-09-11 DIAGNOSIS — M79671 Pain in right foot: Secondary | ICD-10-CM

## 2016-09-11 NOTE — Patient Instructions (Signed)
Pre-Operative Instructions  Congratulations, you have decided to take an important step to improving your quality of life.  You can be assured that the doctors of [facility] will be with you every step of the way.  1. Plan to be at the surgery center/hospital at least 1 (one) hour prior to your scheduled time unless otherwise directed by the surgical center/hospital staff.  You must have a responsible adult accompany you, remain during the surgery and drive you home.  Make sure you have directions to the surgical center/hospital and know how to get there on time. 2. For hospital based surgery you will need to obtain a history and physical form from your family physician within 1 month prior to the date of surgery- we will give you a form for you primary physician.  3. We make every effort to accommodate the date you request for surgery.  There are however, times where surgery dates or times have to be moved.  We will contact you as soon as possible if a change in schedule is required.   4. No Aspirin/Ibuprofen for one week before surgery.  If you are on aspirin, any non-steroidal anti-inflammatory medications (Mobic, Aleve, Ibuprofen) you should stop taking it 7 days prior to your surgery.  You make take Tylenol  For pain prior to surgery.  5. Medications- If you are taking daily heart and blood pressure medications, seizure, reflux, allergy, asthma, anxiety, pain or diabetes medications, make sure the surgery center/hospital is aware before the day of surgery so they may notify you which medications to take or avoid the day of surgery. 6. No food or drink after midnight the night before surgery unless directed otherwise by surgical center/hospital staff. 7. No alcoholic beverages 24 hours prior to surgery.  No smoking 24 hours prior to or 24 hours after surgery. 8. Wear loose pants or shorts- loose enough to fit over bandages, boots, and casts. 9. No slip on shoes, sneakers are best. 10. Bring  your boot with you to the surgery center/hospital.  Also bring crutches or a walker if your physician has prescribed it for you.  If you do not have this equipment, it will be provided for you after surgery. 11. If you have not been contracted by the surgery center/hospital by the day before your surgery, call to confirm the date and time of your surgery. 12. Leave-time from work may vary depending on the type of surgery you have.  Appropriate arrangements should be made prior to surgery with your employer. 13. Prescriptions will be provided immediately following surgery by your doctor.  Have these filled as soon as possible after surgery and take the medication as directed. 14. Remove nail polish on the operative foot. 15. Wash the night before surgery.  The night before surgery wash the foot and leg well with the antibacterial soap provided and water paying special attention to beneath the toenails and in between the toes.  Rinse thoroughly with water and dry well with a towel.  Perform this wash unless told not to do so by your physician.  Enclosed: 1 Ice pack (please put in freezer the night before surgery)   1 Hibiclens skin cleaner   Pre-op Instructions  If you have any questions regarding the instructions, do not hesitate to call our office.  [place]: [address] [place], [place] [address] [phone]  [place]: [address] [place], [place] [address] [phone]  [place]: [address]  , [place] [address] [phone]   Dr.   Norman Regal DPM, Dr. Matthew Wagoner DPM, Dr. M. Todd Hyatt DPM, Dr. Chemere Steffler DPM 

## 2016-09-11 NOTE — Telephone Encounter (Signed)
"  If you call me back before 5, you can reach me at [phone].  After 5 pm or Friday it will be [phone].  Thank you."

## 2016-09-11 NOTE — Progress Notes (Signed)
Subjective: Hailey Patterson is a 60 y.o. female patient seen in the office to sign Informed Consent for bunionectomy and excision of callus and exostectomy 3rd metatarsal head for residual bunion, painful callus, and prominent met head right foot. Patient reports that she had previous bunion surgery and has noticed it recurring and rubbing in shoe and wants this fixed and also states that she still gets a lot of pain at the site plantarly that was previously surgically addressed back in may when her hammertoe were addressed. Patient states that she has tried everything for the conservative care with no relief including wider shoes, inserts, splints, injections etc. Patient denies any other complaints and desires surgical management.   Patient Active Problem List   Diagnosis Date Noted  . Carpal tunnel syndrome 07/03/2015  . Abnormal breast finding 04/06/2015  . Itch of skin 04/06/2015  . General patient noncompliance 02/22/2015  . Bunion 09/08/2014  . Bilateral cataracts 09/07/2014  . Malignant neoplasm of breast ([place]) 09/07/2014  . Chest pain 07/25/2014  . Current tobacco use 10/14/2013  . 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 docusate sodium (COLACE) 100 MG capsule Take 100 mg by mouth daily as needed for mild constipation.    . docusate sodium (COLACE) 100 MG capsule Take 100 mg by mouth 2 (two) times daily.    Marland Kitchen gabapentin (NEURONTIN) 100 MG capsule Take 3 capsules (300 mg total) by mouth at bedtime. 90 capsule 3  . oxyCODONE-acetaminophen (PERCOCET) 10-325 MG tablet Take 1 tablet by mouth every 4 (four) hours as needed for pain. 30 tablet 0  . promethazine (PHENERGAN) 25 MG tablet Take 1 tablet (25 mg total) by mouth every 8 (eight) hours as needed for nausea or vomiting. (Patient not taking: Reported on 03/03/2016) 20 tablet 0  . promethazine (PHENERGAN) 25  MG tablet Take 25 mg by mouth every 6 (six) hours as needed for nausea or vomiting.     Current Facility-Administered Medications on File Prior to Visit  Medication Dose Route Frequency Provider Last Rate Last Dose  . triamcinolone acetonide (KENALOG-40) injection 20 mg  20 mg Other Once Landis Martins, DPM        Allergies  Allergen Reactions  . Macrobid [Nitrofurantoin] Shortness Of Breath and Nausea And Vomiting  . Sulfa Antibiotics Nausea And Vomiting  . Codeine Nausea And Vomiting  . Meperidine Nausea And Vomiting  . Morphine Nausea And Vomiting    Past Surgical History:  Procedure Laterality Date  . ABDOMINAL HYSTERECTOMY    . CESAREAN SECTION    . COLONOSCOPY N/A 05/01/2015   Procedure: COLONOSCOPY;  Surgeon: Josefine Class, MD;  Location: [facility] ENDOSCOPY;  Service: Endoscopy;  Laterality: N/A;  . FOOT SURGERY     bilateral feet  . KIDNEY SURGERY    . TONSILLECTOMY      Social History   Social History  . Marital status: Married    Spouse name: N/A  . Number of children: N/A  . Years of education: N/A   Occupational History  . Not on file.   Social History Main Topics  . Smoking status: Light Tobacco Smoker  . Smokeless tobacco: Not on file  . Alcohol use No  . Drug use: No  . Sexual activity: Not Currently   Other Topics Concern  . Not on file   Social History Narrative  . No narrative on  file    No family history on file.   Objective:  There were no vitals filed for this visit.  General: No acute distress, Awake, alert, oriented x3 Focused foot exam Dermatology: Old surgical scars healed, No open lesions, no webspace macerations, no erythema bilateral.Callus sub-met 3, Right foot. Vascular: Dorsalis Pedis and Posterior Tibial pedal pulses 2/4 bilateral, Capillary Fill Time 3 seconds at all digits, + pedal hair, Temperature gradient within normal limits bilateral, no edema bilateral. Neurology: Gross sensation present via light touch  bilateral. Musculoskeletal: Boney deformity/Prominent dorsomedial eminence of at the right1st metatarsal head, with deviation of hallux and 1st met noted that is reducible in nature, not trackbound, there is no crepitus with range of motion, mild hypermobility of 1st ray on the right foot, prominent third metatarsal head, right foot, Tender to palpation. Lesser digits, Rectus with improved contracture at second and fourth toes status post repair. Muscle strength 5/5 in all groups bilateral. Deep tendon reflexes within normal limits.   Previous x-rays of right foot reveal digital and metatarsal implants, residual bunion with elevatus of first ray. No other acute findings.   Assessment and Plan:  Problem List Items Addressed This Visit      Musculoskeletal and Integument   Bunion - Primary    Other Visit Diagnoses    Prominent metatarsal head of right foot       Callus of foot       Right foot pain          -Patient seen and examined -Previous x-rays reviewed -Patient opt for surgical management. Consent obtained for bunionectomy first metatarsal Using plate and screw fixation, removal of bony prominence spur, plantar third metatarsal and excision of callus plantar aspect of right foot. Pre and Post op course explained. Risks, benefits, alternatives explained. No guarantees given or implied. Surgical booking slip submitted and provided patient with Surgical packet and info for [facility]. -Dispensed short CAM Walker use post op; patient to be nonweightbearing, use of rolling knee scooter which she already owns -Patient to return to office as scheduled for postoperative follow up or sooner if problems arise.  Landis Martins, DPM

## 2016-09-12 NOTE — Telephone Encounter (Signed)
"  I left you a message yesterday. The number you can reach me at is [phone].  Thank you."

## 2016-09-12 NOTE — Telephone Encounter (Signed)
"  I was getting ready to leave the house wanted to leave you another number to call.  I didn't expect you to answer."  I apologize for not calling you yet.  I did receive your messages.  You want to get a surgery date?  "Yes, I would does he have anything available on a Thursday or Friday?"  She does surgery on Mondays.  When would you like to do it?  "I can do it anytime."  She can do it on October 9.   "That date is fine."  Someone from the surgical center will give you a call with the arrival time the Friday before.  "Okay, thank you so much."

## 2016-09-19 ENCOUNTER — Telehealth: Payer: Self-pay | Admitting: *Deleted

## 2016-09-19 NOTE — Telephone Encounter (Signed)
I left patient a message to give me a call back.  I need to reschedule her surgery.  Needs to be done at [facility] due to cost.

## 2016-09-22 NOTE — Telephone Encounter (Signed)
"  You called me Friday.  I'm going to be at this number [phone] until 12 pm.  Then you can reach me at [phone].  Thank you.

## 2016-09-22 NOTE — Telephone Encounter (Signed)
I left patient a message to call me back.  Need to reschedule appointment.

## 2016-09-23 ENCOUNTER — Ambulatory Visit: Payer: Medicaid Other | Admitting: Sports Medicine

## 2016-09-23 ENCOUNTER — Telehealth: Payer: Self-pay | Admitting: *Deleted

## 2016-09-23 NOTE — Telephone Encounter (Signed)
I attempted to call patient again to reschedule her surgery.  Her mother stated she was not there had just left.  I tried calling her on home and mobile numbers with no success.

## 2016-09-23 NOTE — Telephone Encounter (Signed)
"  I was calling back to see what you needed.  I called yesterday but you were tied up.  I'm scheduled to have surgery the 9th of October.  I will be at this number until 2 pm."

## 2016-09-24 ENCOUNTER — Encounter (HOSPITAL_BASED_OUTPATIENT_CLINIC_OR_DEPARTMENT_OTHER): Payer: Self-pay | Admitting: *Deleted

## 2016-09-24 NOTE — Telephone Encounter (Signed)
"  I'm sorry I been missing your call.  I am returning your call.  I guess you have been calling about my surgery."  Yes, we are going to have to do it at [place].  Surgery cannot be performed at [facility] due to financial issues dealing with cost of implant.  In order to have surgery at [facility] a history and physical form has to be completed by your primary care physician.  Have your had a physical recently?  "Yes it has been less than a month since I had it done."  Can you go by the [facility] office to pick up a history and physical form to take to your doctor?  "Yes, I can do that.  I live close so I can easily go by there to get it."  I will try to get it scheduled for Monday.  I will let you know if there is a problem.  "Okay, thank you so much."  Surgery was scheduled for September 29, 2016 at [facility] at 1:45 pm.  I called and informed patient that she is scheduled for Monday.  I told her she would receive a call from someone from the surgical center.  They will give her the arrival time it may be sometime between [address] to 12:45pm.  She stated she would go by the [place] office after she finished feeding her horses to pick up the forms.

## 2016-09-29 ENCOUNTER — Ambulatory Visit (HOSPITAL_BASED_OUTPATIENT_CLINIC_OR_DEPARTMENT_OTHER): Payer: Medicaid Other | Admitting: Anesthesiology

## 2016-09-29 ENCOUNTER — Encounter (HOSPITAL_BASED_OUTPATIENT_CLINIC_OR_DEPARTMENT_OTHER): Payer: Self-pay | Admitting: *Deleted

## 2016-09-29 ENCOUNTER — Ambulatory Visit (HOSPITAL_BASED_OUTPATIENT_CLINIC_OR_DEPARTMENT_OTHER)
Admission: RE | Admit: 2016-09-29 | Discharge: 2016-09-29 | Disposition: A | Discharge status: Home / Self Care | Payer: Medicaid Other | Source: Ambulatory Visit | Attending: Sports Medicine | Admitting: Sports Medicine

## 2016-09-29 ENCOUNTER — Encounter: Payer: Self-pay | Admitting: Sports Medicine

## 2016-09-29 ENCOUNTER — Encounter (HOSPITAL_BASED_OUTPATIENT_CLINIC_OR_DEPARTMENT_OTHER)
Admission: RE | Disposition: A | Discharge status: Home / Self Care | Payer: Self-pay | Source: Ambulatory Visit | Attending: Sports Medicine

## 2016-09-29 DIAGNOSIS — Z888 Allergy status to other drugs, medicaments and biological substances status: Secondary | ICD-10-CM | POA: Diagnosis not present

## 2016-09-29 DIAGNOSIS — L570 Actinic keratosis: Secondary | ICD-10-CM | POA: Diagnosis not present

## 2016-09-29 DIAGNOSIS — M21611 Bunion of right foot: Secondary | ICD-10-CM | POA: Diagnosis not present

## 2016-09-29 DIAGNOSIS — Z881 Allergy status to other antibiotic agents status: Secondary | ICD-10-CM | POA: Insufficient documentation

## 2016-09-29 DIAGNOSIS — M216X9 Other acquired deformities of unspecified foot: Secondary | ICD-10-CM

## 2016-09-29 DIAGNOSIS — M2011 Hallux valgus (acquired), right foot: Secondary | ICD-10-CM

## 2016-09-29 DIAGNOSIS — F17210 Nicotine dependence, cigarettes, uncomplicated: Secondary | ICD-10-CM | POA: Diagnosis not present

## 2016-09-29 DIAGNOSIS — Z882 Allergy status to sulfonamides status: Secondary | ICD-10-CM | POA: Diagnosis not present

## 2016-09-29 DIAGNOSIS — Z853 Personal history of malignant neoplasm of breast: Secondary | ICD-10-CM | POA: Insufficient documentation

## 2016-09-29 DIAGNOSIS — Z885 Allergy status to narcotic agent status: Secondary | ICD-10-CM | POA: Insufficient documentation

## 2016-09-29 DIAGNOSIS — Q810 Epidermolysis bullosa simplex: Secondary | ICD-10-CM

## 2016-09-29 HISTORY — PX: OSTECTOMY: SHX6439

## 2016-09-29 HISTORY — PX: EXOSTECTECTOMY TOE: SHX6618

## 2016-09-29 HISTORY — DX: Anxiety disorder, unspecified: F41.9

## 2016-09-29 HISTORY — PX: MASS EXCISION: SHX2000

## 2016-09-29 SURGERY — OSTECTOMY
Anesthesia: General | Site: Foot | Laterality: Right

## 2016-09-29 MED ORDER — MIDAZOLAM HCL 2 MG/2ML IJ SOLN
INTRAMUSCULAR | Status: AC
  Filled 2016-09-29: qty 2

## 2016-09-29 MED ORDER — LACTATED RINGERS IV SOLN: INTRAVENOUS | Status: DC

## 2016-09-29 MED ORDER — ONDANSETRON HCL 4 MG/2ML IJ SOLN
INTRAMUSCULAR | Status: DC | PRN
  Administered 2016-09-29: 4 mg via INTRAVENOUS

## 2016-09-29 MED ORDER — OXYCODONE-ACETAMINOPHEN 10-325 MG PO TABS: 1.0000 | ORAL_TABLET | ORAL | 0 refills | Status: DC | PRN

## 2016-09-29 MED ORDER — GLYCOPYRROLATE 0.2 MG/ML IJ SOLN: 0.2000 mg | Freq: Once | INTRAMUSCULAR | Status: DC | PRN

## 2016-09-29 MED ORDER — FENTANYL CITRATE (PF) 100 MCG/2ML IJ SOLN
25.0000 ug | INTRAMUSCULAR | Status: DC | PRN
  Administered 2016-09-29 (×2): 50 ug via INTRAVENOUS

## 2016-09-29 MED ORDER — LIDOCAINE HCL (CARDIAC) 20 MG/ML IV SOLN
INTRAVENOUS | Status: DC | PRN
  Administered 2016-09-29: 50 mg via INTRAVENOUS

## 2016-09-29 MED ORDER — SCOPOLAMINE 1 MG/3DAYS TD PT72: 1.0000 | MEDICATED_PATCH | Freq: Once | TRANSDERMAL | Status: DC | PRN

## 2016-09-29 MED ORDER — PROPOFOL 10 MG/ML IV BOLUS
INTRAVENOUS | Status: AC
  Filled 2016-09-29: qty 20

## 2016-09-29 MED ORDER — FENTANYL CITRATE (PF) 100 MCG/2ML IJ SOLN
INTRAMUSCULAR | Status: AC
  Filled 2016-09-29: qty 2

## 2016-09-29 MED ORDER — FENTANYL CITRATE (PF) 100 MCG/2ML IJ SOLN
50.0000 ug | INTRAMUSCULAR | Status: DC | PRN
  Administered 2016-09-29: 100 ug via INTRAVENOUS

## 2016-09-29 MED ORDER — MIDAZOLAM HCL 2 MG/2ML IJ SOLN
1.0000 mg | INTRAMUSCULAR | Status: DC | PRN
  Administered 2016-09-29: 1 mg via INTRAVENOUS
  Administered 2016-09-29: 2 mg via INTRAVENOUS

## 2016-09-29 MED ORDER — METOCLOPRAMIDE HCL 5 MG/ML IJ SOLN: 10.0000 mg | Freq: Once | INTRAMUSCULAR | Status: DC | PRN

## 2016-09-29 MED ORDER — LACTATED RINGERS IV SOLN
INTRAVENOUS | Status: DC
  Administered 2016-09-29 (×2): via INTRAVENOUS

## 2016-09-29 MED ORDER — CEFAZOLIN SODIUM-DEXTROSE 2-4 GM/100ML-% IV SOLN
INTRAVENOUS | Status: AC
  Filled 2016-09-29: qty 100

## 2016-09-29 MED ORDER — DEXAMETHASONE SODIUM PHOSPHATE 10 MG/ML IJ SOLN
INTRAMUSCULAR | Status: DC | PRN
  Administered 2016-09-29: 10 mg via INTRAVENOUS

## 2016-09-29 MED ORDER — BUPIVACAINE HCL (PF) 0.5 % IJ SOLN
INTRAMUSCULAR | Status: AC
  Filled 2016-09-29: qty 30

## 2016-09-29 MED ORDER — CHLORHEXIDINE GLUCONATE CLOTH 2 % EX PADS: 6.0000 | MEDICATED_PAD | Freq: Once | CUTANEOUS | Status: DC

## 2016-09-29 MED ORDER — DOCUSATE SODIUM 100 MG PO CAPS: 100.0000 mg | ORAL_CAPSULE | Freq: Two times a day (BID) | ORAL | 0 refills | Status: DC

## 2016-09-29 MED ORDER — LIDOCAINE HCL (PF) 1 % IJ SOLN
INTRAMUSCULAR | Status: AC
  Filled 2016-09-29: qty 30

## 2016-09-29 MED ORDER — CEFAZOLIN IN D5W 1 GM/50ML IV SOLN
1.0000 g | Freq: Once | INTRAVENOUS | Status: AC
  Administered 2016-09-29: 2 g via INTRAVENOUS

## 2016-09-29 MED ORDER — PROPOFOL 10 MG/ML IV BOLUS
INTRAVENOUS | Status: DC | PRN
  Administered 2016-09-29: 150 mg via INTRAVENOUS

## 2016-09-29 MED ORDER — PROMETHAZINE HCL 25 MG PO TABS: 25.0000 mg | ORAL_TABLET | Freq: Four times a day (QID) | ORAL | 0 refills | Status: DC | PRN

## 2016-09-29 MED ORDER — BUPIVACAINE-EPINEPHRINE (PF) 0.5% -1:200000 IJ SOLN
INTRAMUSCULAR | Status: DC | PRN
  Administered 2016-09-29: 10 mL via PERINEURAL
  Administered 2016-09-29: 20 mL via PERINEURAL

## 2016-09-29 SURGICAL SUPPLY — 70 items
1.5 DRILL BIT ×1 IMPLANT
2.7 x 20 nonlocking screw ×2 IMPLANT
BANDAGE ACE 3X5.8 VEL STRL LF (GAUZE/BANDAGES/DRESSINGS) ×3 IMPLANT
BIT DRILL 1.8 (BIT) ×2
BIT DRILL 1.8XAO BLU (BIT) IMPLANT
BIT DRL 1.8XAO BLU (BIT) ×1
BLADE AVERAGE 25MMX9MM (BLADE) ×1
BLADE AVERAGE 25X9 (BLADE) ×2 IMPLANT
BLADE SURG 15 STRL LF DISP TIS (BLADE) ×2 IMPLANT
BLADE SURG 15 STRL SS (BLADE) ×6
BNDG CMPR 9X4 STRL LF SNTH (GAUZE/BANDAGES/DRESSINGS) ×1
BNDG COHESIVE 3X5 TAN STRL LF (GAUZE/BANDAGES/DRESSINGS) ×2 IMPLANT
BNDG CONFORM 2 STRL LF (GAUZE/BANDAGES/DRESSINGS) ×3 IMPLANT
BNDG ESMARK 4X9 LF (GAUZE/BANDAGES/DRESSINGS) ×3 IMPLANT
BNDG GAUZE ELAST 4 BULKY (GAUZE/BANDAGES/DRESSINGS) ×3 IMPLANT
BUR EGG 3PK/BX (BURR) ×2 IMPLANT
COVER BACK TABLE 60X90IN (DRAPES) ×3 IMPLANT
CUFF TOURNIQUET SINGLE 18IN (TOURNIQUET CUFF) ×2 IMPLANT
DRAPE EXTREMITY T 121X128X90 (DRAPE) ×3 IMPLANT
DRAPE IMP U-DRAPE 54X76 (DRAPES) ×3 IMPLANT
DRAPE OEC MINIVIEW 54X84 (DRAPES) ×2 IMPLANT
DRAPE SURG 17X23 STRL (DRAPES) ×3 IMPLANT
DRAPE U-SHAPE 47X51 STRL (DRAPES) ×1 IMPLANT
DRSG EMULSION OIL 3X3 NADH (GAUZE/BANDAGES/DRESSINGS) ×3 IMPLANT
DURAPREP 26ML APPLICATOR (WOUND CARE) ×5 IMPLANT
ELECT REM PT RETURN 9FT ADLT (ELECTROSURGICAL) ×3
ELECTRODE REM PT RTRN 9FT ADLT (ELECTROSURGICAL) ×1 IMPLANT
GAUZE SPONGE 4X4 12PLY STRL (GAUZE/BANDAGES/DRESSINGS) ×3 IMPLANT
GAUZE SPONGE 4X4 16PLY XRAY LF (GAUZE/BANDAGES/DRESSINGS) IMPLANT
GAUZE XEROFORM 1X8 LF (GAUZE/BANDAGES/DRESSINGS) ×2 IMPLANT
GLOVE BIO SURGEON STRL SZ 6.5 (GLOVE) ×2 IMPLANT
GLOVE BIO SURGEONS STRL SZ 6.5 (GLOVE) ×1
GLOVE BIOGEL M STRL SZ7.5 (GLOVE) ×2 IMPLANT
GLOVE BIOGEL PI IND STRL 6.5 (GLOVE) ×2 IMPLANT
GLOVE BIOGEL PI IND STRL 8 (GLOVE) IMPLANT
GLOVE BIOGEL PI INDICATOR 6.5 (GLOVE) ×2
GLOVE BIOGEL PI INDICATOR 8 (GLOVE) ×2
GOWN STRL REUS W/ TWL XL LVL3 (GOWN DISPOSABLE) IMPLANT
GOWN STRL REUS W/TWL XL LVL3 (GOWN DISPOSABLE) ×3
K-WIRE OLIVE 1.1X100L (WIRE) ×1 IMPLANT
NDL HYPO 25X1 1.5 SAFETY (NEEDLE) ×2 IMPLANT
NDL SAFETY ECLIPSE 18X1.5 (NEEDLE) IMPLANT
NEEDLE HYPO 18GX1.5 SHARP (NEEDLE)
NEEDLE HYPO 25X1 1.5 SAFETY (NEEDLE) ×3 IMPLANT
NS IRRIG 1000ML POUR BTL (IV SOLUTION) IMPLANT
PACK BASIN DAY SURGERY FS (CUSTOM PROCEDURE TRAY) ×3 IMPLANT
PADDING CAST ABS 4INX4YD NS (CAST SUPPLIES) ×2
PADDING CAST ABS COTTON 4X4 ST (CAST SUPPLIES) ×1 IMPLANT
PENCIL BUTTON HOLSTER BLD 10FT (ELECTRODE) ×3 IMPLANT
PUTTY DBM STAGRAFT 2CC (Putty) ×2 IMPLANT
SCREW NONLOCK 2.7X16MM (Screw) ×1 IMPLANT
SCREW NONLOCK 2.7X18 (Screw) ×1 IMPLANT
SCREW NONLOCK 2.7X22 (Screw) ×1 IMPLANT
SLEEVE SCD COMPRESS KNEE MED (MISCELLANEOUS) ×3 IMPLANT
STOCKINETTE 6  STRL (DRAPES) ×2
STOCKINETTE 6 STRL (DRAPES) ×1 IMPLANT
STRIP SUTURE WOUND CLOSURE 1/2 (SUTURE) IMPLANT
SUT ETHIBOND 2-0 V-5 NDL (SUTURE) IMPLANT
SUT ETHIBOND 2-0 V-5 NEEDLE (SUTURE) ×3 IMPLANT
SUT ETHILON 3 0 PS 1 (SUTURE) ×3 IMPLANT
SUT ETHILON 4 0 PS 2 18 (SUTURE) ×2 IMPLANT
SUT MERSILENE 2.0 SH NDLE (SUTURE) IMPLANT
SUT MNCRL AB 3-0 PS2 18 (SUTURE) IMPLANT
SUT MNCRL AB 4-0 PS2 18 (SUTURE) IMPLANT
SUT MON AB 5-0 PS2 18 (SUTURE) IMPLANT
SUT VIC AB 3-0 FS2 27 (SUTURE) ×2 IMPLANT
SYR BULB 3OZ (MISCELLANEOUS) ×3 IMPLANT
SYRINGE 10CC LL (SYRINGE) ×6 IMPLANT
UNDERPAD 30X30 (UNDERPADS AND DIAPERS) ×3 IMPLANT
WEDGE PLATE OPENING BASE (Plate) ×2 IMPLANT

## 2016-09-29 NOTE — Brief Op Note (Signed)
09/29/2016  3:27 PM  PATIENT:  Hailey Patterson  60 y.o. 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[place], POROKERATOSIS  PROCEDURE:  Procedure(s): BASE WEDGE OSTECTOMY FIRST RIGHT TOE (Right) EXOSTECTECTOMY TOE 3RD RIGHT TOE (Right) EXCISION BENIGN LESION 1.0 CM SUB 3 RIGHT (Right)  SURGEON:  Surgeon(s) and Role: Environmental consultant, DPM - Primary  PHYSICIAN ASSISTANT:   ASSISTANTS: none   ANESTHESIA:   General with Popliteal block  EBL:  Total I/O In: 1000 [I.V.:1000] Out: 10 [Blood:10]  BLOOD ADMINISTERED: None  DRAINS: None   LOCAL MEDICATIONS USED:  As above for popliteal block  SPECIMEN:  Skin lesion plantar right foot  DISPOSITION OF SPECIMEN:  Pathology  COUNTS:  Correct  TOURNIQUET:   Total Tourniquet Time Documented: area (laterality) - 89 minutes Total: area (laterality) - 89 minutes   DICTATION: .Epic  PLAN OF CARE: Non-weightbearing right foot. Discharge to home with follow up in office in 1 week.  PATIENT DISPOSITION:  Home   Delay start of Pharmacological VTE agent (>24hrs) due to surgical blood loss or risk of bleeding: No

## 2016-09-29 NOTE — Anesthesia Postprocedure Evaluation (Signed)
Anesthesia Post Note  Patient: Hailey Patterson  Procedure(s) Performed: Procedure(s) (LRB): BASE WEDGE OSTECTOMY FIRST RIGHT TOE (Right) EXOSTECTECTOMY TOE 3RD RIGHT TOE (Right) EXCISION BENIGN LESION 1.0 CM SUB 3 RIGHT (Right)  Patient location during evaluation: PACU Anesthesia Type: General Level of consciousness: awake and alert and patient cooperative Pain management: pain level controlled Vital Signs Assessment: post-procedure vital signs reviewed and stable Respiratory status: spontaneous breathing and respiratory function stable Cardiovascular status: stable Anesthetic complications: no    Last Vitals:  Vitals:   09/29/16 1600 09/29/16 1615  BP: (!) 147/84 139/85  Pulse: 81 78  Resp: 15 14  Temp:      Last Pain:  Vitals:   09/29/16 1600  PainSc: [place]

## 2016-09-29 NOTE — H&P (Signed)
H&P: Podiatry   Hailey Patterson 60 y.o. female  patient with a history of Bunion, prominent metatarsal, and painful lesion seen on several occassions in office complaining of right foot pain; Patient has tried multiple conservative treatments and opted for Surgical management; has had previous surgery with recurrence of bunion deformity. Patient had pre-op physical and clearance for right base bunionectomy, removal of painful lesion and prominent metatarsal head, right foot, completed. Patient met this AM in pre-op holding area; informed consent reviewed and signed. All questions answered. Right foot/surgical site marked.  This am patient denies any other pedal complaints. Denies Nausea/fever/vomiting/chills/night sweats/overnight events. Confirms NPO since midnight.   Patient Active Problem List   Diagnosis Date Noted  . Carpal tunnel syndrome 07/03/2015  . Abnormal breast finding 04/06/2015  . Itch of skin 04/06/2015  . General patient noncompliance 02/22/2015  . Bunion 09/08/2014  . Bilateral cataracts 09/07/2014  . Malignant neoplasm of breast ([place]) 09/07/2014  . Chest pain 07/25/2014  . Current tobacco use 10/14/2013  . 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 Tarsal tunnel syndrome 07/15/2013    No current facility-administered medications on file prior to encounter.    Current Outpatient Prescriptions on File Prior to Encounter  Medication Sig Dispense Refill  . docusate sodium (COLACE) 100 MG capsule Take 100 mg by mouth 2 (two) times daily.    . promethazine (PHENERGAN) 25 MG tablet Take 25 mg by mouth every 6 (six) hours as needed for nausea or vomiting.       Allergies  Allergen Reactions  . Macrobid [Nitrofurantoin] Shortness Of Breath and Nausea And Vomiting  . Sulfa Antibiotics Nausea And Vomiting  . Codeine Nausea And Vomiting  . Meperidine Nausea And Vomiting  . Morphine Nausea And Vomiting    Social History   Social History  . Marital status:  Married    Spouse name: N/A  . Number of children: N/A  . Years of education: N/A   Occupational History  . Not on file.   Social History Main Topics  . Smoking status: Heavy Tobacco Smoker    Packs/day: 0.50    Types: Cigarettes  . Smokeless tobacco: Never Used  . Alcohol use No  . Drug use: No  . Sexual activity: Not Currently   Other Topics Concern  . Not on file   Social History Narrative  . No narrative on file    Past Surgical History:  Procedure Laterality Date  . ABDOMINAL HYSTERECTOMY    . CESAREAN SECTION    . COLONOSCOPY N/A 05/01/2015   Procedure: COLONOSCOPY;  Surgeon: Josefine Class, MD;  Location: [facility] ENDOSCOPY;  Service: Endoscopy;  Laterality: N/A;  . FOOT SURGERY     bilateral feet  . KIDNEY SURGERY    . TONSILLECTOMY      History reviewed. No pertinent family history.   REVIEW OF SYSTEMS: Neurologic: Denies vertigo, syncope, convulsions or headaches. Musculoskeletal: No muscle or joint pain. Cardiorespiratory:  Denies shortness of breath, dyspnea on exertion, chest pain, cough or hemoptysis. Gastrointestinal: Denies loose stool, Denies emesis, melena, constipationor rectal bleeding. Genitourinary: No difficulty with voiding noted.  PHYSICAL EXAMINATION:  Today's Vitals   09/29/16 1220 09/29/16 1255 09/29/16 1300 09/29/16 1305  BP:  129/71  129/71  Pulse:      Resp:    14  Temp:      SpO2: 100% 98% 99% 100%  Weight:      Height:      PainSc:  GENERAL: Well-developed, well-nourished, in no acute distress. Alert  and cooperative.  LOWER EXTREMITY EXAM: DERMATOLOGY: Skin warm and supple bilateral, no open lesions, nails within normal limits. Old surgical scars well healed. Keratosis plantar right foot.  VASCULAR: Dorsalis Pedis 2/4 and Posterior Tibial 2/4 pedal pulses bilateral, Temperature gradient within normal limits, Capillary fill time 3 seconds, positive pedal hair growth present bilateral. NEUROLOGY:  Gross sensation present with light touch bilateral MUSCULOSKELETAL: +foot deformity/ pain with palpation to area of concern, right foot   XRAY, RIGHT FOOT-  Hardware intact, consistent with post op status with residual bunion, mild plantar flexed 3rd metatarsal previous shortening procedure, no other acute findings.   ASSESSMENTS:  1.Recurrent/residual bunion, hx of previous bunionectomy, right foot 2. Plantar prominent metatarsal head, right foot 3. Keratosis, right foot  PLAN OF CARE: Patient seen and evaluated 1. History and physical completed 2. Patient NPO since midnight 3. Previous Imaging reviewed  4. Consent for surgery explained and obtained for Right base wedge bunionectomy with screw and plate, removal of prominence at 3rd metatarsal head, excision of painful plantar lesion right foot; risk and benefits explained; all questions answered and no guarantees granted. 5. Patient to undergo above surgical procedure 6. Case discussed with patient and to meet with  Husband represented after the procedure 7. To resume all home meds post-procedure and to give prn pain meds, anti-nausea, and anti-constipation medications post op 8. Will continue to follow closely/ see post-operative in the office within 1 week.  Landis Martins, DPM

## 2016-09-29 NOTE — Transfer of Care (Signed)
Immediate Anesthesia Transfer of Care Note  Patient: Hailey Patterson  Procedure(s) Performed: Procedure(s): BASE WEDGE OSTECTOMY FIRST RIGHT TOE (Right) EXOSTECTECTOMY TOE 3RD RIGHT TOE (Right) EXCISION BENIGN LESION 1.0 CM SUB 3 RIGHT (Right)  Patient Location: PACU  Anesthesia Type:General  Level of Consciousness: awake and alert   Airway & Oxygen Therapy: Patient Spontanous Breathing and Patient connected to face mask oxygen  Post-op Assessment: Report given to RN and Post -op Vital signs reviewed and stable  Post vital signs: Reviewed and stable  Last Vitals:  Vitals:   09/29/16 1255 09/29/16 1305  BP: 129/71 129/71  Pulse:    Resp:  14  Temp:      Last Pain:  Vitals:   09/29/16 1216  PainSc: 6          Complications: No apparent anesthesia complications

## 2016-09-29 NOTE — Progress Notes (Signed)
Anesthesia H&P Update: History and Physical Exam reviewed; patient is OK for planned anesthetic and procedure. ? ?

## 2016-09-29 NOTE — Anesthesia Preprocedure Evaluation (Addendum)
Anesthesia Evaluation  Patient identified by MRN, date of birth, ID band Patient awake    Reviewed: Allergy & Precautions, NPO status , Patient's Chart, lab work & pertinent test results  Airway Mallampati: II  TM Distance: >3 FB Neck ROM: Full    Dental no notable dental hx. (+) Upper Dentures   Pulmonary Current Smoker,    Pulmonary exam normal breath sounds clear to auscultation       Cardiovascular negative cardio ROS Normal cardiovascular exam Rhythm:Regular Rate:Normal     Neuro/Psych negative neurological ROS  negative psych ROS   GI/Hepatic negative GI ROS, Neg liver ROS,   Endo/Other  negative endocrine ROS  Renal/GU negative Renal ROS  negative genitourinary   Musculoskeletal negative musculoskeletal ROS (+)   Abdominal   Peds negative pediatric ROS (+)  Hematology negative hematology ROS (+)   Anesthesia Other Findings   Reproductive/Obstetrics negative OB ROS                            Anesthesia Physical Anesthesia Plan  ASA: II  Anesthesia Plan: General   Post-op Pain Management: GA combined w/ Regional for post-op pain   Induction: Intravenous  Airway Management Planned: LMA  Additional Equipment:   Intra-op Plan:   Post-operative Plan: Extubation in OR  Informed Consent: I have reviewed the patients History and Physical, chart, labs and discussed the procedure including the risks, benefits and alternatives for the proposed anesthesia with the patient or authorized representative who has indicated his/her understanding and acceptance.   Dental advisory given  Plan Discussed with: CRNA  Anesthesia Plan Comments:         Anesthesia Quick Evaluation

## 2016-09-29 NOTE — Anesthesia Procedure Notes (Signed)
Anesthesia Regional Block:  Popliteal block  Pre-Anesthetic Checklist: ,, timeout performed, Correct Patient, Correct Site, Correct Laterality, Correct Procedure, Correct Position, site marked, Risks and benefits discussed,  Surgical consent,  Pre-op evaluation,  At surgeon's request and post-op pain management  Laterality: Right and Lower  Prep: Maximum Sterile Barrier Precautions used, chloraprep       Needles:  Injection technique: Single-shot  Needle Type: Echogenic Stimulator Needle     Needle Length: 10cm 10 cm Needle Gauge: 21 G    Additional Needles:  Procedures: ultrasound guided (picture in chart) Popliteal block Narrative:  Injection made incrementally with aspirations every 5 mL.  Performed by: Personally   Additional Notes: Risks, benefits and alternative to block explained extensively.  Patient tolerated procedure well, without complications.

## 2016-09-29 NOTE — Op Note (Signed)
DATE OF OPERATION: 09-29-16  PREOPERATIVE DIAGNOSES:  1. Bunion, recurrent, Right (history of previous bunion surgery with recurrence)  2. Plantar skin lesion, right foot 3. Prominent 3rd metatarsal head, right foot  POSTOPERATIVE DIAGNOSIS:  Same as above  OPERATION PERFORMED:  1. Base wedge osteotomy with plate and screw fixation, right foot 2. Excision of plantar lesion, right foot 3. Ostectomy 3rd metatarsal head, right foot   SURGEON: Environmental consultant, DPM   ASSISTANT: None  ESTIMATED BLOOD LOSS:  Minimal.   HEMOSTASIS:  Ankle tourniquet   INJECTABLES:  None; Pre-op pop block by anesthesia  INDICATIONS FOR OPERATION:  The patient is a 60 y.o. female patient with clinical and radiographic signs and symptoms consistent with the above-stated diagnosis.The patient has chosen surgical correction of the diagnosis and has consented for surgery. All risks, benefits and complications have been explained to the patient including but not limited to recurrence of the deformity, dehiscence of incision site and the need for further surgery. The patient understands. No guarantees were made or implied to the outcome of the surgery.   DESCRIPTION OF OPERATION:  The patient was brought into operating room and positioned on the OR table in the supine position. Following appropriate padding of all bony areas, ankle tourniquet was placed on right. Following the foot was then prepped and draped in a sterile fashion.   Attention was directed towards the right foot where esmarch bandage was applied and tourniquet inflated. Attention was directed to the 1st metatarsophalangeal joint right foot where bunion deformity was noted. Following the contour of the deformity using a 15 blade incision was made through skin and subcutaneous tissue with care to protect and retract all vital structures. Following linear capsulotomy was performed and 1st metatarsophalangeal joint freed with McGlamry. Following medial  and dorsal eminence was resected and then attention was directed to the base at apex of deformity were osteotomy was made and opened using 10m base wedge plate by Integra. Following the plate was secured using manufactures technique good position was confirmed with fluoroscopy. Following StaGraft BDM putty was applied to osteotomy site at opening wedge then the area flushed. Capsular structures approximated using 2-0 Ethibond, subcutaneous re-approximated using 3-0 vicryl. The skin was re-coapted utilizing 4-0 nylon in a running suture fashion.   Following attention was directed to plantar surface of the right foot where there was a keratotic lesion noted sub met 3. Using a 15 blade the lesion was excised 3:1 and following sent as specimen to pathology. The area was deepened revealing plantar aspect of the 3rd metatarsal with the flexor tendon retracted medially and laterally. Then using a rotary bur the bony prominence of the 3rd metatarsal head was removed . The area was flushed with saline and skin was re-approximated using 3-0 nylon in a simple fashion.   A dry sterile dressing was applied and the tourniquet was rapidly deflated and prompt instantaneous hyperemic response was noted to all aspects of the patient's right lower extremity with digital capillary refill time measuring less than 3 seconds to digits 1-5 of the right foot.  The patient tolerated the procedure and anesthesia well and was transported from the operating room to the recovery waiting area with vital signs stable and vascular status intact to all aspects of the patient's right lower extremity.   Following a period of postoperative monitoring, the patient was discharged home with oral and written instructions per Dr. SCannon Kettle The patient was given a prescription of Percocet 10/'325mg'$ , Colace '100mg'$ , Phenergan '25mg'$  PRN  nausea. The patient will remain non-weightbearing with CAM boot and rolling knee scooter and was informed to keep dressing  clean dry and intact and to follow up in office in 1 week for continued post op care.  Landis Martins, DPM

## 2016-09-29 NOTE — Progress Notes (Signed)
Assisted Dr. Carignan with right, ultrasound guided, popliteal/saphenous block. Side rails up, monitors on throughout procedure. See vital signs in flow sheet. Tolerated Procedure well. 

## 2016-09-29 NOTE — Discharge Instructions (Signed)
° ° °  Regional Anesthesia Blocks ° °1. Numbness or the inability to move the "blocked" extremity may last from 3-48 hours after placement. The length of time depends on the medication injected and your individual response to the medication. If the numbness is not going away after 48 hours, call your surgeon. ° °2. The extremity that is blocked will need to be protected until the numbness is gone and the  Strength has returned. Because you cannot feel it, you will need to take extra care to avoid injury. Because it may be weak, you may have difficulty moving it or using it. You may not know what position it is in without looking at it while the block is in effect. ° °3. For blocks in the legs and feet, returning to weight bearing and walking needs to be done carefully. You will need to wait until the numbness is entirely gone and the strength has returned. You should be able to move your leg and foot normally before you try and bear weight or walk. You will need someone to be with you when you first try to ensure you do not fall and possibly risk injury. ° °4. Bruising and tenderness at the needle site are common side effects and will resolve in a few days. ° °5. Persistent numbness or new problems with movement should be communicated to the surgeon or the [facility] ([phone])/  Surgery Center ([phone]). ° ° ° °Post Anesthesia Home Care Instructions ° °Activity: °Get plenty of rest for the remainder of the day. A responsible adult should stay with you for 24 hours following the procedure.  °For the next 24 hours, DO NOT: °-Drive a car °-Operate machinery °-Drink alcoholic beverages °-Take any medication unless instructed by your physician °-Make any legal decisions or sign important papers. ° °Meals: °Start with liquid foods such as gelatin or soup. Progress to regular foods as tolerated. Avoid greasy, spicy, heavy foods. If nausea and/or vomiting occur, drink only clear liquids until  the nausea and/or vomiting subsides. Call your physician if vomiting continues. ° °Special Instructions/Symptoms: °Your throat may feel dry or sore from the anesthesia or the breathing tube placed in your throat during surgery. If this causes discomfort, gargle with warm salt water. The discomfort should disappear within 24 hours. ° °If you had a scopolamine patch placed behind your ear for the management of post- operative nausea and/or vomiting: ° °1. The medication in the patch is effective for 72 hours, after which it should be removed.  Wrap patch in a tissue and discard in the trash. Wash hands thoroughly with soap and water. °2. You may remove the patch earlier than 72 hours if you experience unpleasant side effects which may include dry mouth, dizziness or visual disturbances. °3. Avoid touching the patch. Wash your hands with soap and water after contact with the patch. °  ° °

## 2016-09-29 NOTE — Anesthesia Procedure Notes (Signed)
Anesthesia Regional Block:  Adductor canal block  Pre-Anesthetic Checklist: ,, timeout performed, Correct Patient, Correct Site, Correct Laterality, Correct Procedure, Correct Position, site marked, Risks and benefits discussed,  Surgical consent,  Pre-op evaluation,  At surgeon's request and post-op pain management  Laterality: Right and Lower  Prep: Maximum Sterile Barrier Precautions used, chloraprep       Needles:  Injection technique: Single-shot  Needle Type: Echogenic Stimulator Needle     Needle Length: 10cm 10 cm Needle Gauge: 21 G    Additional Needles:  Procedures: ultrasound guided (picture in chart) Adductor canal block Narrative:  Injection made incrementally with aspirations every 5 mL.  Performed by: Personally   Additional Notes: Risks, benefits and alternative to block explained extensively.  Patient tolerated procedure well, without complications.

## 2016-09-29 NOTE — Anesthesia Procedure Notes (Signed)
Procedure Name: LMA Insertion Date/Time: 09/29/2016 1:31 PM Performed by: Melynda Ripple D Pre-anesthesia Checklist: Patient identified, Emergency Drugs available, Suction available and Patient being monitored Patient Re-evaluated:Patient Re-evaluated prior to inductionOxygen Delivery Method: Circle system utilized Preoxygenation: Pre-oxygenation with 100% oxygen Intubation Type: IV induction Ventilation: Mask ventilation without difficulty LMA: LMA inserted LMA Size: 4.0 Tube size: 3.0 mm Number of attempts: 1 Airway Equipment and Method: Bite block Placement Confirmation: positive ETCO2 Tube secured with: Tape Dental Injury: Teeth and Oropharynx as per pre-operative assessment

## 2016-09-30 ENCOUNTER — Encounter (HOSPITAL_BASED_OUTPATIENT_CLINIC_OR_DEPARTMENT_OTHER): Payer: Self-pay | Admitting: Sports Medicine

## 2016-09-30 ENCOUNTER — Telehealth: Payer: Self-pay | Admitting: Sports Medicine

## 2016-09-30 NOTE — Addendum Note (Signed)
Addendum  created 09/30/16 1221 by Tawni Millers, CRNA   Charge Capture section accepted

## 2016-09-30 NOTE — Telephone Encounter (Signed)
Post op check phone call made to patient. Patient's mom initially answered the phone and stated that her daughter did good and slept and ate last night. Patient then got on the phone and confirmed that she has no pain in the foot. Admits to a little tingling behind the knee but no other issues. I advised patient to continue with pain medication, ice, elevation and to get her phenergan and colace from the pharmacy. Patient to follow up as scheduled in 1 week or sooner if problems or issues arise. -Dr. Cannon Kettle

## 2016-10-01 NOTE — Progress Notes (Signed)
DOS 10.09.2017 Right Bunionectomy with Screw and Plate, Right Removal of 3rd Metatarsal Boney Bump Plantar Surface and Callus Skin

## 2016-10-07 ENCOUNTER — Ambulatory Visit (INDEPENDENT_AMBULATORY_CARE_PROVIDER_SITE_OTHER): Payer: Medicaid Other | Admitting: Sports Medicine

## 2016-10-07 ENCOUNTER — Encounter: Payer: Self-pay | Admitting: Sports Medicine

## 2016-10-07 ENCOUNTER — Ambulatory Visit (INDEPENDENT_AMBULATORY_CARE_PROVIDER_SITE_OTHER): Payer: Medicaid Other

## 2016-10-07 DIAGNOSIS — M79671 Pain in right foot: Secondary | ICD-10-CM

## 2016-10-07 DIAGNOSIS — M2011 Hallux valgus (acquired), right foot: Secondary | ICD-10-CM

## 2016-10-07 DIAGNOSIS — Z9889 Other specified postprocedural states: Secondary | ICD-10-CM

## 2016-10-07 MED ORDER — HYDROMORPHONE HCL 4 MG PO TABS: 4.0000 mg | ORAL_TABLET | ORAL | 0 refills | Status: DC | PRN

## 2016-10-07 NOTE — Progress Notes (Signed)
Subjective: Hailey Patterson is a 60 y.o. female patient seen today in office for POV #1, DOS 09/29/2016 Base wedge osteotomy, excision of Lesion sub met 3rd and ostectomy 3rd met head RIGHT FOOT. Patient admits pain at surgical site, denies calf pain, denies headache, chest pain, shortness of breath, nausea, vomiting, fever, or chills. Admits pain that radiates down buttocks along sciatic area. No other issues noted.   Patient Active Problem List   Diagnosis Date Noted  . Carpal tunnel syndrome 07/03/2015  . Abnormal breast finding 04/06/2015  . Itch of skin 04/06/2015  . General patient noncompliance 02/22/2015  . Bunion 09/08/2014  . Bilateral cataracts 09/07/2014  . Malignant neoplasm of breast ([place]) 09/07/2014  . Chest pain 07/25/2014  . Current tobacco use 10/14/2013  . 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 citalopram (CELEXA) 20 MG tablet Take 20 mg by mouth daily.    Marland Kitchen docusate sodium (COLACE) 100 MG capsule Take 100 mg by mouth 2 (two) times daily.    Marland Kitchen docusate sodium (COLACE) 100 MG capsule Take 1 capsule (100 mg total) by mouth 2 (two) times daily. 10 capsule 0  . oxyCODONE-acetaminophen (PERCOCET) 10-325 MG tablet Take 1 tablet by mouth every 4 (four) hours as needed for pain. 30 tablet 0  . promethazine (PHENERGAN) 25 MG tablet Take 25 mg by mouth every 6 (six) hours as needed for nausea or vomiting.    . promethazine (PHENERGAN) 25 MG tablet Take 1 tablet (25 mg total) by mouth every 6 (six) hours as needed for nausea or vomiting. 30 tablet 0   No current facility-administered medications on file prior to visit.     Allergies  Allergen Reactions  . Macrobid [Nitrofurantoin] Shortness Of Breath and Nausea And Vomiting  . Sulfa Antibiotics Nausea And Vomiting  . Codeine Nausea And Vomiting  . Meperidine Nausea And Vomiting  . Morphine  Nausea And Vomiting    Objective: There were no vitals filed for this visit.  General: No acute distress, AAOx3  Right foot: Sutures intact with no gapping or dehiscence at surgical sites, mild swelling to right forefoot, no erythema, no warmth, no drainage, no signs of infection noted, Capillary fill time <3 seconds in all digits, gross sensation present via light touch to right foot. Mild hypersensitivity to right foot. No pain with calf compression.   Post Op Xray, Right foot: Plate and screws in good alignment and position. Osteotomy site healing. Hardware intact. Soft tissue swelling within normal limits for post op status.   Assessment and Plan:  Problem List Items Addressed This Visit    None    Visit Diagnoses    Right foot pain    -  Primary   Relevant Orders   DG Foot Complete Right   Status post right foot surgery          -Patient seen and evaluated -Xrays reviewed  -Applied dry sterile dressing to surgical site right foot secured with ACE wrap and stockinet  -Advised patient to make sure to keep dressings clean, dry, and intact to right surgical site, removing the ACE as needed  -Advised patient to continue with post-op boot and NWB with knee scooter -Advised patient to limit activity to necessity  -Advised patient to ice and elevate as necessary  -Rx Diluadid to see if this will give longer pain relief as compared to  Percocet -May consider to increase gabapentin dose if continues to have radiating pain -Advised change of position or body pillows when in bed to prevent worsening radiating symptoms -Will plan for dorsal suture removal at next office visit. In the meantime, patient to call office if any issues or problems arise.   Landis Martins, DPM

## 2016-10-08 ENCOUNTER — Encounter: Payer: Medicaid Other | Admitting: Sports Medicine

## 2016-10-14 ENCOUNTER — Encounter: Payer: Self-pay | Admitting: Sports Medicine

## 2016-10-14 ENCOUNTER — Ambulatory Visit (INDEPENDENT_AMBULATORY_CARE_PROVIDER_SITE_OTHER): Payer: Medicaid Other | Admitting: Sports Medicine

## 2016-10-14 DIAGNOSIS — Z9889 Other specified postprocedural states: Secondary | ICD-10-CM

## 2016-10-14 DIAGNOSIS — M79671 Pain in right foot: Secondary | ICD-10-CM

## 2016-10-14 MED ORDER — PROMETHAZINE HCL 25 MG PO TABS: 25.0000 mg | ORAL_TABLET | Freq: Four times a day (QID) | ORAL | 0 refills | Status: DC | PRN

## 2016-10-14 MED ORDER — HYDROMORPHONE HCL 4 MG PO TABS: 4.0000 mg | ORAL_TABLET | ORAL | 0 refills | Status: AC | PRN

## 2016-10-14 NOTE — Progress Notes (Signed)
Subjective: Hailey Patterson is a 60 y.o. female patient seen today in office for POV #2, DOS 09/29/2016 Base wedge osteotomy, excision of Lesion sub met 3rd and ostectomy 3rd met head RIGHT FOOT. Patient admits pain at surgical site. There is a little better than last week. Admits that she got a pain patch from her daughter, which has helped with the pain that has been radiating down the back of her leg along her sciatic nerve area, denies calf pain, denies headache, chest pain, shortness of breath, nausea, vomiting, fever, or chills.  No other issues noted.   Patient Active Problem List   Diagnosis Date Noted  . Carpal tunnel syndrome 07/03/2015  . Abnormal breast finding 04/06/2015  . Itch of skin 04/06/2015  . General patient noncompliance 02/22/2015  . Bunion 09/08/2014  . Bilateral cataracts 09/07/2014  . Malignant neoplasm of breast ([place]) 09/07/2014  . Chest pain 07/25/2014  . Current tobacco use 10/14/2013  . 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 citalopram (CELEXA) 20 MG tablet Take 20 mg by mouth daily.    Marland Kitchen docusate sodium (COLACE) 100 MG capsule Take 100 mg by mouth 2 (two) times daily.    Marland Kitchen docusate sodium (COLACE) 100 MG capsule Take 1 capsule (100 mg total) by mouth 2 (two) times daily. 10 capsule 0  . oxyCODONE-acetaminophen (PERCOCET) 10-325 MG tablet Take 1 tablet by mouth every 4 (four) hours as needed for pain. 30 tablet 0  . promethazine (PHENERGAN) 25 MG tablet Take 25 mg by mouth every 6 (six) hours as needed for nausea or vomiting.     No current facility-administered medications on file prior to visit.     Allergies  Allergen Reactions  . Macrobid [Nitrofurantoin] Shortness Of Breath and Nausea And Vomiting  . Sulfa Antibiotics Nausea And Vomiting  . Codeine Nausea And Vomiting  . Meperidine Nausea And Vomiting  . Morphine  Nausea And Vomiting    Objective: There were no vitals filed for this visit.  General: No acute distress, AAOx3  Right foot: Sutures intact with no gapping or dehiscence at surgical sites, mild swelling to right forefoot, no erythema, no warmth, no drainage, no signs of infection noted, Capillary fill time <3 seconds in all digits, gross sensation present via light touch to right foot. Mild hypersensitivity to right foot. No pain with calf compression.   Assessment and Plan:  Problem List Items Addressed This Visit    None    Visit Diagnoses    Right foot pain    -  Primary   Status post right foot surgery          -Patient seen and evaluated -Sutures are not ready to be removed at this visit -Applied dry sterile dressing to surgical site right foot secured with ACE wrap and stockinet  -Advised patient to make sure to keep dressings clean, dry, and intact to right surgical site, removing the ACE as needed  -Advised patient to continue with post-op boot and NWB with knee scooter -Advised patient to limit activity to necessity  -Advised patient to ice and elevate as necessary  -Refilled Phenergan and Diluadid to see if this will give longer pain relief as compared to Percocet and to refrain from using pain patch while taking oral medication -Advised patient to resume gabapentin dose if continues to have radiating pain -Advised change  of position or body pillows when in bed to prevent worsening radiating symptoms -Will plan for dorsal and plantar suture removal at next office visit and discontinuing rolling knee scooter to allow protective weightbearing. In the meantime, patient to call office if any issues or problems arise.   Landis Martins, DPM

## 2016-10-15 ENCOUNTER — Encounter: Payer: Medicaid Other | Admitting: Sports Medicine

## 2016-10-21 ENCOUNTER — Ambulatory Visit (INDEPENDENT_AMBULATORY_CARE_PROVIDER_SITE_OTHER): Payer: Medicaid Other

## 2016-10-21 ENCOUNTER — Ambulatory Visit (INDEPENDENT_AMBULATORY_CARE_PROVIDER_SITE_OTHER): Payer: Medicaid Other | Admitting: Sports Medicine

## 2016-10-21 DIAGNOSIS — M21619 Bunion of unspecified foot: Secondary | ICD-10-CM

## 2016-10-21 DIAGNOSIS — Z9889 Other specified postprocedural states: Secondary | ICD-10-CM

## 2016-10-21 DIAGNOSIS — M216X1 Other acquired deformities of right foot: Secondary | ICD-10-CM

## 2016-10-21 NOTE — Progress Notes (Signed)
Subjective: Hailey Patterson is a 60 y.o. female patient seen today in office for POV #3, DOS 09/29/2016 Base wedge osteotomy, excision of Lesion sub met 3rd and ostectomy 3rd met head RIGHT FOOT. Patient admits pain at surgical site.that is getting a little better than last week, denies calf pain, denies headache, chest pain, shortness of breath, nausea, vomiting, fever, or chills.  No other issues noted.   Patient Active Problem List   Diagnosis Date Noted  . Carpal tunnel syndrome 07/03/2015  . Abnormal breast finding 04/06/2015  . Itch of skin 04/06/2015  . General patient noncompliance 02/22/2015  . Bunion 09/08/2014  . Bilateral cataracts 09/07/2014  . Malignant neoplasm of breast ([place]) 09/07/2014  . Chest pain 07/25/2014  . Current tobacco use 10/14/2013  . 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 citalopram (CELEXA) 20 MG tablet Take 20 mg by mouth daily.    Marland Kitchen docusate sodium (COLACE) 100 MG capsule Take 100 mg by mouth 2 (two) times daily.    Marland Kitchen docusate sodium (COLACE) 100 MG capsule Take 1 capsule (100 mg total) by mouth 2 (two) times daily. 10 capsule 0  . HYDROmorphone (DILAUDID) 4 MG tablet Take 1 tablet (4 mg total) by mouth every 4 (four) hours as needed for severe pain. 30 tablet 0  . oxyCODONE-acetaminophen (PERCOCET) 10-325 MG tablet Take 1 tablet by mouth every 4 (four) hours as needed for pain. 30 tablet 0  . promethazine (PHENERGAN) 25 MG tablet Take 25 mg by mouth every 6 (six) hours as needed for nausea or vomiting.    . promethazine (PHENERGAN) 25 MG tablet Take 1 tablet (25 mg total) by mouth every 6 (six) hours as needed for nausea or vomiting. 30 tablet 0   No current facility-administered medications on file prior to visit.     Allergies  Allergen Reactions  . Macrobid [Nitrofurantoin] Shortness Of Breath and Nausea And  Vomiting  . Sulfa Antibiotics Nausea And Vomiting  . Codeine Nausea And Vomiting  . Meperidine Nausea And Vomiting  . Morphine Nausea And Vomiting    Objective: There were no vitals filed for this visit.  General: No acute distress, AAOx3  Right foot: Sutures intact with no gapping or dehiscence at surgical sites, mild swelling to right forefoot, no erythema, no warmth, no drainage, no signs of infection noted, Capillary fill time <3 seconds in all digits, gross sensation present via light touch to right foot. Mild hypersensitivity to right foot. No pain with calf compression.   Post Op Xray, Right foot: No acute changes, Plate and screws in goodalignment and position. Osteotomy site healing. Hardware intact. Soft tissue swelling within normal limits for post op status.   Assessment and Plan:  Problem List Items Addressed This Visit      Musculoskeletal and Integument   Bunion   Relevant Orders   DG Foot Complete Right    Other Visit Diagnoses    Status post right foot surgery    -  Primary   Relevant Orders   DG Foot Complete Right   Prominent metatarsal head of right foot       Relevant Orders   DG Foot Complete Right      -Patient seen and evaluated -Dorsal sutures are not ready to be removed at this visit however at the plantar aspect every other suture was  removed -Applied dry sterile dressing to surgical site right foot secured with ACE wrap and stockinet  -Advised patient to make sure to keep dressings clean, dry, and intact to right surgical site, removing the ACE as needed  -Advised patient to continue with post-op boot and NWB with knee scooter with slow transition to walking boot as tolerated -Advised patient to limit activity to necessity  -Advised patient to ice and elevate as necessary  -Continue with PRN meds as Rx -Advised patient to continue gabapentin dose if continues to have radiating pain -Advised change of position or body pillows when in bed to prevent  worsening radiating symptoms -Handicap parking given for 6 months -Will plan for dorsal and remaining plantar suture removal at next office visit. In the meantime, patient to call office if any issues or problems arise.   Landis Martins, DPM

## 2016-10-28 ENCOUNTER — Ambulatory Visit (INDEPENDENT_AMBULATORY_CARE_PROVIDER_SITE_OTHER): Payer: Medicaid Other

## 2016-10-28 ENCOUNTER — Ambulatory Visit (INDEPENDENT_AMBULATORY_CARE_PROVIDER_SITE_OTHER): Payer: Medicaid Other | Admitting: Sports Medicine

## 2016-10-28 DIAGNOSIS — M216X9 Other acquired deformities of unspecified foot: Secondary | ICD-10-CM | POA: Diagnosis not present

## 2016-10-28 DIAGNOSIS — M216X1 Other acquired deformities of right foot: Secondary | ICD-10-CM

## 2016-10-28 DIAGNOSIS — M21619 Bunion of unspecified foot: Secondary | ICD-10-CM

## 2016-10-28 DIAGNOSIS — Z9889 Other specified postprocedural states: Secondary | ICD-10-CM

## 2016-10-28 NOTE — Progress Notes (Addendum)
Subjective: Hailey Patterson is a 60 y.o. female patient seen today in office for POV #4, DOS 09/29/2016 Base wedge osteotomy, excision of Lesion sub met 3rd and ostectomy 3rd met head RIGHT FOOT. Patient admits pain at surgical site.that is getting better than last week, able to wear cam boot, denies calf pain, denies headache, chest pain, shortness of breath, nausea, vomiting, fever, or chills.  No other issues noted.   Patient Active Problem List   Diagnosis Date Noted  . Carpal tunnel syndrome 07/03/2015  . Abnormal breast finding 04/06/2015  . Itch of skin 04/06/2015  . General patient noncompliance 02/22/2015  . Bunion 09/08/2014  . Bilateral cataracts 09/07/2014  . Malignant neoplasm of breast ([place]) 09/07/2014  . Chest pain 07/25/2014  . Current tobacco use 10/14/2013  . 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 citalopram (CELEXA) 20 MG tablet Take 20 mg by mouth daily.    Marland Kitchen docusate sodium (COLACE) 100 MG capsule Take 100 mg by mouth 2 (two) times daily.    Marland Kitchen docusate sodium (COLACE) 100 MG capsule Take 1 capsule (100 mg total) by mouth 2 (two) times daily. 10 capsule 0  . HYDROmorphone (DILAUDID) 4 MG tablet Take 1 tablet (4 mg total) by mouth every 4 (four) hours as needed for severe pain. 30 tablet 0  . oxyCODONE-acetaminophen (PERCOCET) 10-325 MG tablet Take 1 tablet by mouth every 4 (four) hours as needed for pain. 30 tablet 0  . promethazine (PHENERGAN) 25 MG tablet Take 25 mg by mouth every 6 (six) hours as needed for nausea or vomiting.    . promethazine (PHENERGAN) 25 MG tablet Take 1 tablet (25 mg total) by mouth every 6 (six) hours as needed for nausea or vomiting. 30 tablet 0   No current facility-administered medications on file prior to visit.     Allergies  Allergen Reactions  . Macrobid [Nitrofurantoin] Shortness Of Breath and Nausea  And Vomiting  . Sulfa Antibiotics Nausea And Vomiting  . Codeine Nausea And Vomiting  . Meperidine Nausea And Vomiting  . Morphine Nausea And Vomiting    Objective: There were no vitals filed for this visit.  General: No acute distress, AAOx3  Right foot: Sutures intact with minimal gapping or dehiscence at surgical sites, mild swelling to right forefoot, no erythema, no warmth, no drainage, no signs of infection noted, Capillary fill time <3 seconds in all digits, gross sensation present via light touch to right foot. Mild elevation 4th toe as previous. Decreased hypersensitivity to right foot. No pain with calf compression.   Xrays- no acute changes  Assessment and Plan:  Problem List Items Addressed This Visit      Musculoskeletal and Integument   Bunion - Primary   Relevant Orders   DG Foot Complete Right    Other Visit Diagnoses    Status post right foot surgery       Relevant Orders   DG Foot Complete Right   Prominent metatarsal head of right foot       Relevant Orders   DG Foot Complete Right      -Patient seen and evaluated -Remaining sutures removed and steri strips applied -Advised patient to make sure to keep dressings clean, dry, and intact to right surgical site x 1 week -Advised patient to continue with CAM boot -Advised patient to limit activity to necessity  -Advised  patient to ice and elevate as necessary  -Continue with PRN meds as Rx -Advised patient to continue gabapentin as needed -To use darco toe splint to 4th toe -Will plan xray and determine if can transition from boot at next visit. In the meantime, patient to call office if any issues or problems arise.   Landis Martins, DPM

## 2016-10-30 ENCOUNTER — Encounter: Payer: Self-pay | Admitting: Sports Medicine

## 2016-10-30 ENCOUNTER — Ambulatory Visit (INDEPENDENT_AMBULATORY_CARE_PROVIDER_SITE_OTHER): Payer: Medicaid Other | Admitting: Sports Medicine

## 2016-10-30 DIAGNOSIS — Z9889 Other specified postprocedural states: Secondary | ICD-10-CM

## 2016-10-30 DIAGNOSIS — M21619 Bunion of unspecified foot: Secondary | ICD-10-CM

## 2016-10-30 DIAGNOSIS — M216X1 Other acquired deformities of right foot: Secondary | ICD-10-CM

## 2016-10-30 DIAGNOSIS — T8130XA Disruption of wound, unspecified, initial encounter: Secondary | ICD-10-CM

## 2016-10-30 DIAGNOSIS — M79671 Pain in right foot: Secondary | ICD-10-CM

## 2016-10-30 NOTE — Progress Notes (Signed)
Subjective: Hailey Patterson is a 60 y.o. female patient seen today in office for POV #5, DOS 09/29/2016 Base wedge osteotomy, excision of Lesion sub met 3rd and ostectomy 3rd met head RIGHT FOOT. Patient admits that she was out in the yard raking leaves with CAM boot on and the dressing felt moist so she took it off and noticed that there was some bleeding coming from the area where the sutures were removed on the plantar aspect of the foot, denies calf pain, denies headache, chest pain, shortness of breath, nausea, vomiting, fever, or chills.  No other issues noted.   Patient Active Problem List   Diagnosis Date Noted  . Carpal tunnel syndrome 07/03/2015  . Abnormal breast finding 04/06/2015  . Itch of skin 04/06/2015  . General patient noncompliance 02/22/2015  . Bunion 09/08/2014  . Bilateral cataracts 09/07/2014  . Malignant neoplasm of breast ([facility]) 09/07/2014  . Chest pain 07/25/2014  . Current tobacco use 10/14/2013  . 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 citalopram (CELEXA) 20 MG tablet Take 20 mg by mouth daily.    Marland Kitchen docusate sodium (COLACE) 100 MG capsule Take 100 mg by mouth 2 (two) times daily.    Marland Kitchen docusate sodium (COLACE) 100 MG capsule Take 1 capsule (100 mg total) by mouth 2 (two) times daily. 10 capsule 0  . oxyCODONE-acetaminophen (PERCOCET) 10-325 MG tablet Take 1 tablet by mouth every 4 (four) hours as needed for pain. 30 tablet 0  . promethazine (PHENERGAN) 25 MG tablet Take 25 mg by mouth every 6 (six) hours as needed for nausea or vomiting.    . promethazine (PHENERGAN) 25 MG tablet Take 1 tablet (25 mg total) by mouth every 6 (six) hours as needed for nausea or vomiting. 30 tablet 0   No current facility-administered medications on file prior to visit.     Allergies  Allergen Reactions  . Macrobid [Nitrofurantoin] Shortness Of  Breath and Nausea And Vomiting  . Sulfa Antibiotics Nausea And Vomiting  . Codeine Nausea And Vomiting  . Meperidine Nausea And Vomiting  . Morphine Nausea And Vomiting    Objective: There were no vitals filed for this visit.  General: No acute distress, AAOx3  Right foot: Plantar proximal incision line there is mild gapping or dehiscence at surgical site, the dorsal incision appears to be well approximated with no active bleeding or drainage or opening, mild swelling to right forefoot, no erythema, no warmth, no drainage, no signs of infection noted, Capillary fill time <3 seconds in all digits, gross sensation present via light touch to right foot. Mild elevation 4th toe as previous. Decreased hypersensitivity to right foot. No pain with calf compression.   Assessment and Plan:  Problem List Items Addressed This Visit      Musculoskeletal and Integument   Bunion    Other Visit Diagnoses    Wound dehiscence    -  Primary   Status post right foot surgery       Prominent metatarsal head of right foot       Right foot pain          -Patient seen and evaluated -Re-applied Steri-Strips to surgical site and advised patient to stay off her surgical foot and rest as much as possible and that because she has the cam boot on, that does not give her permission to do  excessive walking and standing -Advised patient to make sure to change dressing, and apply Betadine to incision sites every other day until next visit -Advised patient to continue with CAM boot -Advised patient to limit activity to necessity with no excessive walking or standing beyond 20 minutes at a time without a period of rest, continue with elevation  -Advised patient to ice as necessary  -Continue with PRN meds as Rx -Advised patient to continue gabapentin as needed -To use darco toe splint to 4th toe once dressings are completely removed and incision sites are completely healed -Will plan xray and determine if can  transition from boot at next visit. In the meantime, patient to call office if any issues or problems arise.   Landis Martins, DPM

## 2016-11-04 ENCOUNTER — Ambulatory Visit (INDEPENDENT_AMBULATORY_CARE_PROVIDER_SITE_OTHER): Payer: Medicaid Other | Admitting: Sports Medicine

## 2016-11-04 ENCOUNTER — Telehealth: Payer: Self-pay | Admitting: *Deleted

## 2016-11-04 ENCOUNTER — Encounter: Payer: Self-pay | Admitting: Sports Medicine

## 2016-11-04 DIAGNOSIS — T8130XA Disruption of wound, unspecified, initial encounter: Secondary | ICD-10-CM

## 2016-11-04 DIAGNOSIS — Z9889 Other specified postprocedural states: Secondary | ICD-10-CM

## 2016-11-04 NOTE — Telephone Encounter (Addendum)
-----   Message from [place], [place] sent at 11/04/2016 11:38 AM EST ----- Regarding: Wound vac Order wound vac for plantar right surgical site dehiscence  158mmHg vac to be changed 3x per week -Dr. Cannon Kettle. 11/05/2016-Faxed required referral form, Wound Vac form, snapshot, pt demographics and L[facility]. 11/06/2016-Pt states she wants a call, [facility] states she doesn't need the Wound Vac. [place] states she was unable to get the black foam into the wound, sent picture of the wound to her Wound Care Supervisor and she recommended wet to dry dressings, due to the lack of depth in the wound. [facility] states due to the lack of depth of the wound they are concerned Medicaid will not cover the Wound Vac. Amy states that if wet to dry dressings are to be applied pt is able to perform and they can monitor site up to 3x week, please advise. Left message for Amy Pope,with Dr. Leeanne Rio orders and request for call back with confirmation that the instructions are understood. [place] states she received the clarification of the orders and will return to pt and put the wound vac on as prescribed. Amy states she does want to let Dr. Cannon Kettle know pt is not staying off the foot, and was in the process of going out to feet the chickens and her horses, only uses the knee scooter parttime. Informed Dr. Cannon Kettle of [place] care pt update and continuation of original orders for wound vac.11/12/2016-Trisha - [place] states their SOP states they must a protective field between skin and the black foam, will need verbal order to use betadine prep, then black foam prior to application of wound vac. I spoke with Dr. Cannon Kettle she states betadine prep to skin, then black foam and then the clear sealant tape and then the wound vac. I informed [place]. 11/18/2016-DrCannon Kettle states pt sees her on Tuesdays and she wants  the wound vac care to be on Thursdays and Saturdays. Orders emailed to [place]. 11/26/2016-Left message for [place] with Dr. Leeanne Rio 11/25/2016 orders to discontinue the wound vac and home health care nursing, and to please call with confirmation this message received and other instructions if needed.

## 2016-11-04 NOTE — Progress Notes (Addendum)
Subjective: Hailey Patterson is a 60 y.o. female patient seen today in office for POV #6, DOS 09/29/2016 Base wedge osteotomy, excision of Lesion sub met 3rd and ostectomy 3rd met head RIGHT FOOT. Patient admits to bleeding and more of the skin gapping where the sutures were removed on the plantar aspect of the foot, States that she has been off the foot more and using scooter, denies calf pain, denies headache, chest pain, shortness of breath, nausea, vomiting, fever, or chills.  No other issues noted.   Patient Active Problem List   Diagnosis Date Noted  . Carpal tunnel syndrome 07/03/2015  . Abnormal breast finding 04/06/2015  . Itch of skin 04/06/2015  . General patient noncompliance 02/22/2015  . Bunion 09/08/2014  . Bilateral cataracts 09/07/2014  . Malignant neoplasm of breast ([place]) 09/07/2014  . Chest pain 07/25/2014  . Current tobacco use 10/14/2013  . 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 citalopram (CELEXA) 20 MG tablet Take 20 mg by mouth daily.    Marland Kitchen docusate sodium (COLACE) 100 MG capsule Take 100 mg by mouth 2 (two) times daily.    Marland Kitchen docusate sodium (COLACE) 100 MG capsule Take 1 capsule (100 mg total) by mouth 2 (two) times daily. 10 capsule 0  . oxyCODONE-acetaminophen (PERCOCET) 10-325 MG tablet Take 1 tablet by mouth every 4 (four) hours as needed for pain. 30 tablet 0  . promethazine (PHENERGAN) 25 MG tablet Take 25 mg by mouth every 6 (six) hours as needed for nausea or vomiting.    . promethazine (PHENERGAN) 25 MG tablet Take 1 tablet (25 mg total) by mouth every 6 (six) hours as needed for nausea or vomiting. 30 tablet 0   No current facility-administered medications on file prior to visit.     Allergies  Allergen Reactions  . Macrobid [Nitrofurantoin] Shortness Of Breath and Nausea And Vomiting  . Sulfa Antibiotics Nausea And  Vomiting  . Codeine Nausea And Vomiting  . Meperidine Nausea And Vomiting  . Morphine Nausea And Vomiting    Objective: There were no vitals filed for this visit.  General: No acute distress, AAOx3  Right foot: Plantar proximal incision line there is mild gapping or dehiscence at surgical site measures approx 3x0.3x0.5cm along incision, the dorsal incision appears to be well approximated with no active bleeding or drainage or opening, mild swelling to right forefoot, no erythema, no warmth, no drainage, no signs of infection noted, Capillary fill time <3 seconds in all digits, gross sensation present via light touch to right foot. Mild elevation 4th toe as previous. Decreased hypersensitivity to right foot. No pain with calf compression.   Assessment and Plan:  Problem List Items Addressed This Visit    None    Visit Diagnoses    Wound dehiscence    -  Primary   Status post right foot surgery          -Patient seen and evaluated -Re-applied Steri-Strips to surgical site and advised patient to stay off her surgical foot and rest as much as possible and to rely more of rolling scooter -Advised patient to make sure to change dressing, and apply Betadine to incision sites every other day until next visit or until wound vac therapy has started -Advised patient to ice as necessary  -Continue with PRN meds as Rx -Advised patient to continue gabapentin as needed -To  use darco toe splint to 4th toe once dressings are completely removed and incision sites are completely healed -Will wound vac to treat area of dehiscence with home nursing to assist with vac dressing changes. In the meantime, patient to call office if any issues or problems arise.   Landis Martins, DPM

## 2016-11-05 ENCOUNTER — Telehealth: Payer: Self-pay | Admitting: Podiatry

## 2016-11-05 NOTE — Telephone Encounter (Signed)
This patient called after hours on November 13 concerning her foot. She says she had surgery by Dr. Cannon Kettle on October 7.  She says she has been seen by Dr. Cannon Kettle even last week and Dr. Cannon Kettle  had noted that there was some gapping at the incision site. She was seen by Dr. Cannon Kettle who who evaluated her and sent her home. She now  says that her incision has further gapped open. open. She says she is not having any pus and only drainage, no chills and no redness at the surgical site. At this time. I told her to soak it in Epsom salts and  bandage. She is to call the office tomorrow morning and Dr. Cannon Kettle will be able to see her at the [place] office.   Gardiner Barefoot DPM

## 2016-11-06 NOTE — Telephone Encounter (Signed)
Great. Thank you.

## 2016-11-06 NOTE — Telephone Encounter (Signed)
The wound vac is for the incision to lay the foam along the incision NOT to stuff it in. I use wound vacs for incisions all the time; That's the purpose of this treatment to prevent further or a deeper dehiscence.  Dr. Cannon Kettle

## 2016-11-11 ENCOUNTER — Ambulatory Visit (INDEPENDENT_AMBULATORY_CARE_PROVIDER_SITE_OTHER): Payer: Medicaid Other | Admitting: Sports Medicine

## 2016-11-11 ENCOUNTER — Encounter: Payer: Self-pay | Admitting: Sports Medicine

## 2016-11-11 DIAGNOSIS — T8130XA Disruption of wound, unspecified, initial encounter: Secondary | ICD-10-CM

## 2016-11-11 DIAGNOSIS — T8131XA Disruption of external operation (surgical) wound, not elsewhere classified, initial encounter: Secondary | ICD-10-CM

## 2016-11-11 DIAGNOSIS — Z9889 Other specified postprocedural states: Secondary | ICD-10-CM

## 2016-11-11 NOTE — Progress Notes (Signed)
Subjective: Hailey Patterson is a 60 y.o. female patient seen today in office for POV #7, DOS 09/29/2016 Base wedge osteotomy, excision of Lesion sub met 3rd and ostectomy 3rd met head RIGHT FOOT with mild plantar incision site dehisence. Patient admits to nursing staff coming to house with dome nurses unskilled on how to place wound vac. Reports that she has been staying of her foot and using scooter, denies calf pain, denies headache, chest pain, shortness of breath, nausea, vomiting, fever, or chills.  No other issues noted.   Patient Active Problem List   Diagnosis Date Noted  . Carpal tunnel syndrome 07/03/2015  . Abnormal breast finding 04/06/2015  . Itch of skin 04/06/2015  . General patient noncompliance 02/22/2015  . Bunion 09/08/2014  . Bilateral cataracts 09/07/2014  . Malignant neoplasm of breast ([place]) 09/07/2014  . Chest pain 07/25/2014  . Current tobacco use 10/14/2013  . 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 citalopram (CELEXA) 20 MG tablet Take 20 mg by mouth daily.    Marland Kitchen docusate sodium (COLACE) 100 MG capsule Take 100 mg by mouth 2 (two) times daily.    Marland Kitchen docusate sodium (COLACE) 100 MG capsule Take 1 capsule (100 mg total) by mouth 2 (two) times daily. 10 capsule 0  . oxyCODONE-acetaminophen (PERCOCET) 10-325 MG tablet Take 1 tablet by mouth every 4 (four) hours as needed for pain. 30 tablet 0  . promethazine (PHENERGAN) 25 MG tablet Take 25 mg by mouth every 6 (six) hours as needed for nausea or vomiting.    . promethazine (PHENERGAN) 25 MG tablet Take 1 tablet (25 mg total) by mouth every 6 (six) hours as needed for nausea or vomiting. 30 tablet 0   No current facility-administered medications on file prior to visit.     Allergies  Allergen Reactions  . Macrobid [Nitrofurantoin] Shortness Of Breath and Nausea And Vomiting  . Sulfa  Antibiotics Nausea And Vomiting  . Codeine Nausea And Vomiting  . Meperidine Nausea And Vomiting  . Morphine Nausea And Vomiting    Objective: There were no vitals filed for this visit.  General: No acute distress, AAOx3  Right foot: Plantar proximal incision line there is mild gapping or dehiscence at surgical site measures approx 3x0.3x0.4cm (similar to last visit) along incision, the dorsal incision appears to be well approximated with no active bleeding or drainage or opening, mild swelling to right forefoot, no erythema, no warmth, no drainage, no signs of infection noted, Capillary fill time <3 seconds in all digits, gross sensation present via light touch to right foot. Mild elevation 4th toe as previous. Decreased hypersensitivity to right foot. No pain with calf compression.   Assessment and Plan:  Problem List Items Addressed This Visit    None    Visit Diagnoses    Wound dehiscence    -  Primary   Status post right foot surgery          -Patient seen and evaluated -Applied wound vac at 16m Continuous to plantar right foot dehisence site and Re-applied Steri-Strips to dorsal surgical site and advised patient to stay off her surgical foot and rest as much as possible and to rely more of rolling scooter -Advised patient to ice as necessary  -Continue with PRN meds as Rx -Advised patient to continue gabapentin as needed -To use darco toe splint to 4th toe  once dressings are completely removed and incision sites are completely healed -Continue with home nursing to assist with vac dressing changes. In the meantime, patient to call office if any issues or problems arise.   Landis Martins, DPM

## 2016-11-18 ENCOUNTER — Ambulatory Visit (INDEPENDENT_AMBULATORY_CARE_PROVIDER_SITE_OTHER): Payer: Medicaid Other

## 2016-11-18 ENCOUNTER — Other Ambulatory Visit: Payer: Self-pay | Admitting: Sports Medicine

## 2016-11-18 ENCOUNTER — Ambulatory Visit (INDEPENDENT_AMBULATORY_CARE_PROVIDER_SITE_OTHER): Payer: Medicaid Other | Admitting: Sports Medicine

## 2016-11-18 DIAGNOSIS — M21619 Bunion of unspecified foot: Secondary | ICD-10-CM

## 2016-11-18 DIAGNOSIS — Z9889 Other specified postprocedural states: Secondary | ICD-10-CM

## 2016-11-18 DIAGNOSIS — T8130XA Disruption of wound, unspecified, initial encounter: Secondary | ICD-10-CM

## 2016-11-18 DIAGNOSIS — M216X9 Other acquired deformities of unspecified foot: Secondary | ICD-10-CM | POA: Diagnosis not present

## 2016-11-18 NOTE — Progress Notes (Signed)
Subjective: Hailey Patterson is a 60 y.o. female patient seen today in office for POV #8, DOS 09/29/2016 Base wedge osteotomy, excision of Lesion sub met 3rd and ostectomy 3rd met head RIGHT FOOT with mild plantar incision site dehisence. Patient admits to difficulty with home nursing. Reports that she has been staying of her foot and using scooter, denies calf pain, denies headache, chest pain, shortness of breath, nausea, vomiting, fever, or chills.  No other issues noted.   Patient Active Problem List   Diagnosis Date Noted  . Carpal tunnel syndrome 07/03/2015  . Abnormal breast finding 04/06/2015  . Itch of skin 04/06/2015  . General patient noncompliance 02/22/2015  . Bunion 09/08/2014  . Bilateral cataracts 09/07/2014  . Malignant neoplasm of breast ([place]) 09/07/2014  . Chest pain 07/25/2014  . Current tobacco use 10/14/2013  . 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 citalopram (CELEXA) 20 MG tablet Take 20 mg by mouth daily.    Marland Kitchen docusate sodium (COLACE) 100 MG capsule Take 100 mg by mouth 2 (two) times daily.    Marland Kitchen docusate sodium (COLACE) 100 MG capsule Take 1 capsule (100 mg total) by mouth 2 (two) times daily. 10 capsule 0  . oxyCODONE-acetaminophen (PERCOCET) 10-325 MG tablet Take 1 tablet by mouth every 4 (four) hours as needed for pain. 30 tablet 0  . promethazine (PHENERGAN) 25 MG tablet Take 25 mg by mouth every 6 (six) hours as needed for nausea or vomiting.    . promethazine (PHENERGAN) 25 MG tablet Take 1 tablet (25 mg total) by mouth every 6 (six) hours as needed for nausea or vomiting. 30 tablet 0   No current facility-administered medications on file prior to visit.     Allergies  Allergen Reactions  . Macrobid [Nitrofurantoin] Shortness Of Breath and Nausea And Vomiting  . Sulfa Antibiotics Nausea And Vomiting  . Codeine Nausea And  Vomiting  . Meperidine Nausea And Vomiting  . Morphine Nausea And Vomiting    Objective: There were no vitals filed for this visit.  General: No acute distress, AAOx3  Right foot: Plantar proximal incision line there is mild gapping or dehiscence at surgical site measures approx 3x0.5x0.4cm (similar to last visit) along incision, the dorsal incision appears to be well approximated with no active bleeding or drainage or opening, mild swelling to right forefoot, no erythema, no warmth, no drainage, no signs of infection noted, Capillary fill time <3 seconds in all digits, gross sensation present via light touch to right foot. Mild elevation 4th toe as previous. Decreased hypersensitivity to right foot. No pain with calf compression.   Assessment and Plan:  Problem List Items Addressed This Visit    None    Visit Diagnoses    Wound dehiscence    -  Primary   Status post right foot surgery          -Patient seen and evaluated -Applied wound vac at '125mg'$  Continuous to plantar right foot dehisence site and Re-applied Steri-Strips to dorsal surgical site and advised patient to stay off her surgical foot and rest as much as possible and to rely more of rolling scooter -Advised patient to ice as necessary  -Continue with PRN meds as Rx -Advised patient to continue gabapentin as needed -To use darco toe splint to 4th toe once dressings are completely removed and incision sites are completely healed -  Continue with home nursing to assist with vac dressing changes 2x weekly. In the meantime, patient to call office if any issues or problems arise.   Landis Martins, DPM

## 2016-11-20 ENCOUNTER — Telehealth: Payer: Self-pay | Admitting: Sports Medicine

## 2016-11-20 NOTE — Telephone Encounter (Signed)
Pt has wound vac on and has home health coming out to change bandage, they are coming out this Saturday and pt has an appt with you on Tuesday will it be ok for pt to wait that long to have it changed.

## 2016-11-20 NOTE — Telephone Encounter (Signed)
Can you let the patient know that Yes this should be fine since her wound has minimal drainage -Dr. Cannon Kettle

## 2016-11-25 ENCOUNTER — Ambulatory Visit (INDEPENDENT_AMBULATORY_CARE_PROVIDER_SITE_OTHER): Payer: Self-pay | Admitting: Sports Medicine

## 2016-11-25 VITALS — Temp 98.1°F

## 2016-11-25 DIAGNOSIS — T8130XA Disruption of wound, unspecified, initial encounter: Secondary | ICD-10-CM

## 2016-11-25 DIAGNOSIS — Z9889 Other specified postprocedural states: Secondary | ICD-10-CM

## 2016-11-25 NOTE — Progress Notes (Signed)
Subjective: Hailey Patterson is a 60 y.o. female patient seen today in office for POV #9, DOS 09/29/2016 Base wedge osteotomy, excision of Lesion sub met 3rd and ostectomy 3rd met head RIGHT FOOT with mild plantar incision site dehisence. Patient admits to continued difficulty with home nursing. Reports that she has been staying of her foot and using scooter, denies calf pain, denies headache, chest pain, shortness of breath, nausea, vomiting, fever, or chills.  No other issues noted.   Patient Active Problem List   Diagnosis Date Noted  . Carpal tunnel syndrome 07/03/2015  . Abnormal breast finding 04/06/2015  . Itch of skin 04/06/2015  . General patient noncompliance 02/22/2015  . Bunion 09/08/2014  . Bilateral cataracts 09/07/2014  . Malignant neoplasm of breast ([place]) 09/07/2014  . Chest pain 07/25/2014  . Current tobacco use 10/14/2013  . 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 citalopram (CELEXA) 20 MG tablet Take 20 mg by mouth daily.    Marland Kitchen docusate sodium (COLACE) 100 MG capsule Take 100 mg by mouth 2 (two) times daily.    Marland Kitchen docusate sodium (COLACE) 100 MG capsule Take 1 capsule (100 mg total) by mouth 2 (two) times daily. 10 capsule 0  . oxyCODONE-acetaminophen (PERCOCET) 10-325 MG tablet Take 1 tablet by mouth every 4 (four) hours as needed for pain. 30 tablet 0  . promethazine (PHENERGAN) 25 MG tablet Take 25 mg by mouth every 6 (six) hours as needed for nausea or vomiting.    . promethazine (PHENERGAN) 25 MG tablet Take 1 tablet (25 mg total) by mouth every 6 (six) hours as needed for nausea or vomiting. 30 tablet 0   No current facility-administered medications on file prior to visit.     Allergies  Allergen Reactions  . Macrobid [Nitrofurantoin] Shortness Of Breath and Nausea And Vomiting  . Sulfa Antibiotics Nausea And Vomiting  . Codeine  Nausea And Vomiting  . Meperidine Nausea And Vomiting  . Morphine Nausea And Vomiting    Objective: There were no vitals filed for this visit.  General: No acute distress, AAOx3  Right foot: Distal incision line gapping at dorsal aspect from irritation from wound vac paper. Plantar proximal incision line there is mild gapping or dehiscence at surgical site measures approx 3x0.2x0.4cm (similar to last visit) along incision, no active bleeding or drainage, mild swelling to right forefoot, no erythema, no warmth, no drainage, no signs of infection noted, Capillary fill time <3 seconds in all digits, gross sensation present via light touch to right foot. Mild elevation 4th toe as previous. Decreased hypersensitivity to right foot. No pain with calf compression.   Assessment and Plan:  Problem List Items Addressed This Visit    None    Visit Diagnoses    Wound dehiscence    -  Primary   Status post right foot surgery         -Patient seen and evaluated -Applied steristrips and dry dressing advised to keep intact until time for surgery -Home wound care and vac discontinued -Patient opt for surgical management. Consent obtained for primary closure of wound dorsal and plantar aspect of right foot. Pre and Post op course explained. Risks, benefits, alternatives explained. No guarantees given or implied. Surgical booking slip submitted and provided patient with Surgical packet and info for [facility].  -Continue with PRN meds as Rx -Advised patient to continue  gabapentin as needed -To use darco toe splint to 4th toe once dressings are completely removed and incision sites are completely healed -Continue with Nonweightbearing to right foot. In the meantime, patient to call office if any issues or problems arise.   Landis Martins, DPM

## 2016-11-25 NOTE — Patient Instructions (Signed)
Pre-Operative Instructions  Congratulations, you have decided to take an important step to improving your quality of life.  You can be assured that the doctors of [facility] will be with you every step of the way.  1. Plan to be at the surgery center/hospital at least 1 (one) hour prior to your scheduled time unless otherwise directed by the surgical center/hospital staff.  You must have a responsible adult accompany you, remain during the surgery and drive you home.  Make sure you have directions to the surgical center/hospital and know how to get there on time. 2. For hospital based surgery you will need to obtain a history and physical form from your family physician within 1 month prior to the date of surgery- we will give you a form for you primary physician.  3. We make every effort to accommodate the date you request for surgery.  There are however, times where surgery dates or times have to be moved.  We will contact you as soon as possible if a change in schedule is required.   4. No Aspirin/Ibuprofen for one week before surgery.  If you are on aspirin, any non-steroidal anti-inflammatory medications (Mobic, Aleve, Ibuprofen) you should stop taking it 7 days prior to your surgery.  You make take Tylenol  For pain prior to surgery.  5. Medications- If you are taking daily heart and blood pressure medications, seizure, reflux, allergy, asthma, anxiety, pain or diabetes medications, make sure the surgery center/hospital is aware before the day of surgery so they may notify you which medications to take or avoid the day of surgery. 6. No food or drink after midnight the night before surgery unless directed otherwise by surgical center/hospital staff. 7. No alcoholic beverages 24 hours prior to surgery.  No smoking 24 hours prior to or 24 hours after surgery. 8. Wear loose pants or shorts- loose enough to fit over bandages, boots, and casts. 9. No slip on shoes, sneakers are best. 10. Bring  your boot with you to the surgery center/hospital.  Also bring crutches or a walker if your physician has prescribed it for you.  If you do not have this equipment, it will be provided for you after surgery. 11. If you have not been contracted by the surgery center/hospital by the day before your surgery, call to confirm the date and time of your surgery. 12. Leave-time from work may vary depending on the type of surgery you have.  Appropriate arrangements should be made prior to surgery with your employer. 13. Prescriptions will be provided immediately following surgery by your doctor.  Have these filled as soon as possible after surgery and take the medication as directed. 14. Remove nail polish on the operative foot. 15. Wash the night before surgery.  The night before surgery wash the foot and leg well with the antibacterial soap provided and water paying special attention to beneath the toenails and in between the toes.  Rinse thoroughly with water and dry well with a towel.  Perform this wash unless told not to do so by your physician.  Enclosed: 1 Ice pack (please put in freezer the night before surgery)   1 Hibiclens skin cleaner   Pre-op Instructions  If you have any questions regarding the instructions, do not hesitate to call our office.  [place]: [address] [place], [place] [address] [phone]  [place]: [address] [place], [place] [address] [phone]  [place]: [address]  [place], [place] [address] [phone]   Dr.   Norman Regal DPM, Dr. Matthew Wagoner DPM, Dr. M. Todd Hyatt DPM, Dr. Carlin Attridge DPM 

## 2016-11-26 NOTE — Telephone Encounter (Signed)
-----   Message from Landis Martins, [place] sent at 11/25/2016  7:31 PM EST ----- Regarding: Discontinue home wound care Discontinue wound vac and home nursing. Thanks Dr Cannon Kettle

## 2016-11-28 ENCOUNTER — Encounter: Payer: Self-pay | Admitting: Urology

## 2016-11-28 ENCOUNTER — Ambulatory Visit: Payer: Medicaid Other | Admitting: Urology

## 2016-11-28 VITALS — BP 151/90 | HR 96 | Ht 61.0 in | Wt 165.0 lb

## 2016-11-28 DIAGNOSIS — R310 Gross hematuria: Secondary | ICD-10-CM | POA: Diagnosis not present

## 2016-11-28 DIAGNOSIS — R3129 Other microscopic hematuria: Secondary | ICD-10-CM

## 2016-11-28 LAB — URINALYSIS, COMPLETE
Bilirubin, UA: NEGATIVE
Glucose, UA: NEGATIVE
Ketones, UA: NEGATIVE
LEUKOCYTES UA: NEGATIVE
Nitrite, UA: NEGATIVE
PH UA: 5.5 (ref 5.0–7.5)
Protein, UA: NEGATIVE
SPEC GRAV UA: 1.02 (ref 1.005–1.030)
Urobilinogen, Ur: 0.2 mg/dL (ref 0.2–1.0)

## 2016-11-28 LAB — MICROSCOPIC EXAMINATION: BACTERIA UA: NONE SEEN

## 2016-11-28 NOTE — Addendum Note (Signed)
Addended by: Kyra Manges on: 11/28/2016 02:43 PM   Modules accepted: Orders

## 2016-11-28 NOTE — Progress Notes (Signed)
11/28/2016 2:31 PM   Hailey Patterson 1956-04-24 [id number]  Referring provider: Sharyne Peach, MD [place], [place] [address]  Chief Complaint  Patient presents with  . New Patient (Initial Visit)    Hematuria    HPI: I was consulted to assess the patient's microscopic hematuria. Of note she saw a little bit of blood 2 weeks ago. She had no associated pain.  She likely had a reflux surgery at age 60. She denies a history urinary tract infections or kidney stones.  She does smoke. She does not take daily aspirin or blood thinners.  She has rare stress incontinence not wearing a pad. She voids every 2 hours and gets up once or twice a night  She has had extensive foot surgery with a wound VAC and needs more surgery on Monday  Modifying factors: There are no other modifying factors  Associated signs and symptoms: There are no other associated signs and symptoms Aggravating and relieving factors: There are no other aggravating or relieving factors Severity: Moderate Duration: Persistent     PMH: Past Medical History:  Diagnosis Date  . Anxiety   . Breast cancer [facility])     Surgical History: Past Surgical History:  Procedure Laterality Date  . ABDOMINAL HYSTERECTOMY    . CESAREAN SECTION    . COLONOSCOPY N/A 05/01/2015   Procedure: COLONOSCOPY;  Surgeon: Josefine Class, MD;  Location[facility] ENDOSCOPY;  Service: Endoscopy;  Laterality: N/A;  . EXOSTECTECTOMY TOE Right 09/29/2016   Procedure: EXOSTECTECTOMY TOE 3RD RIGHT TOE;  Surgeon: Landis Martins, DPM;  Location: [place];  Service: Podiatry;  Laterality: Right;  . FOOT SURGERY     bilateral feet  . KIDNEY SURGERY    . MASS EXCISION Right 09/29/2016   Procedure: EXCISION BENIGN LESION 1.0 CM SUB 3 RIGHT;  Surgeon: Landis Martins, DPM;  Location: [facility];  Service: Podiatry;  Laterality: Right;  . OSTECTOMY Right 09/29/2016   Procedure: BASE WEDGE OSTECTOMY FIRST  RIGHT TOE;  Surgeon: Landis Martins, DPM;  Location: [place];  Service: Podiatry;  Laterality: Right;  . TONSILLECTOMY      Home Medications:    Medication List       Accurate as of 11/28/16  2:31 PM. Always use your most recent med list.          citalopram 20 MG tablet Commonly known as:  CELEXA Take 20 mg by mouth daily.   docusate sodium 100 MG capsule Commonly known as:  COLACE Take 100 mg by mouth 2 (two) times daily.   docusate sodium 100 MG capsule Commonly known as:  COLACE Take 1 capsule (100 mg total) by mouth 2 (two) times daily.   oxyCODONE-acetaminophen 10-325 MG tablet Commonly known as:  PERCOCET Take 1 tablet by mouth every 4 (four) hours as needed for pain.   promethazine 25 MG tablet Commonly known as:  PHENERGAN Take 25 mg by mouth every 6 (six) hours as needed for nausea or vomiting.   promethazine 25 MG tablet Commonly known as:  PHENERGAN Take 1 tablet (25 mg total) by mouth every 6 (six) hours as needed for nausea or vomiting.       Allergies:  Allergies  Allergen Reactions  . Macrobid [Nitrofurantoin] Shortness Of Breath and Nausea And Vomiting  . Sulfa Antibiotics Nausea And Vomiting  . Codeine Nausea And Vomiting  . Meperidine Nausea And Vomiting  . Morphine Nausea And Vomiting  Family History: Family History  Problem Relation Age of Onset  . Bladder Cancer Neg Hx   . Kidney cancer Neg Hx     Social History:  reports that she has been smoking Cigarettes.  She has been smoking about 0.50 packs per day. She has never used smokeless tobacco. She reports that she does not drink alcohol or use drugs.  ROS: UROLOGY Frequent Urination?: No Hard to postpone urination?: No Burning/pain with urination?: No Get up at night to urinate?: No Leakage of urine?: No Urine stream starts and stops?: No Trouble starting stream?: No Do you have to strain to urinate?: No Blood in urine?: No Urinary tract infection?:  No Sexually transmitted disease?: No Injury to kidneys or bladder?: No Painful intercourse?: No Weak stream?: No Currently pregnant?: No Vaginal bleeding?: No Last menstrual period?: n  Gastrointestinal Nausea?: No Vomiting?: No Indigestion/heartburn?: No Diarrhea?: No Constipation?: Yes  Constitutional Fever: No Night sweats?: Yes Weight loss?: No Fatigue?: Yes  Skin Skin rash/lesions?: Yes Itching?: Yes  Eyes Blurred vision?: No Double vision?: No  Ears/Nose/Throat Sore throat?: No Sinus problems?: No  Hematologic/Lymphatic Swollen glands?: No Easy bruising?: No  Cardiovascular Leg swelling?: No Chest pain?: No  Respiratory Cough?: No Shortness of breath?: No  Endocrine Excessive thirst?: No  Musculoskeletal Back pain?: Yes Joint pain?: Yes  Neurological Headaches?: Yes Dizziness?: No  Psychologic Depression?: No Anxiety?: Yes  Physical Exam: BP (!) 151/90 (BP Location: Left Arm, Patient Position: Sitting, Cuff Size: Normal)   Pulse 96   Ht 5\' 1"  (1.549 m)   Wt 165 lb (74.8 kg)   BMI 31.18 kg/m   Constitutional:  Alert and oriented, No acute distress. HEENT: [place] AT, moist mucus membranes.  Trachea midline, no masses. Cardiovascular: No clubbing, cyanosis, or edema. Respiratory: Normal respiratory effort, no increased work of breathing. GI: Abdomen is soft, nontender, nondistended, no abdominal masses GU: No CVA tenderness. No bladder tenderness Skin: No rashes, bruises or suspicious lesions. Lymph: No cervical or inguinal adenopathy. Neurologic: Grossly intact, no focal deficits, moving all 4 extremities. Psychiatric: Normal mood and affect.  Laboratory Data: Lab Results  Component Value Date   WBC 6.7 03/03/2016   HGB 14.7 03/03/2016   HCT 42.7 03/03/2016   MCV 93.7 03/03/2016   PLT 202 03/03/2016    Lab Results  Component Value Date   CREATININE 0.98 03/03/2016    No results found for: PSA  No results found for:  TESTOSTERONE  No results found for: HGBA1C  Urinalysis    Component Value Date/Time   COLORURINE STRAW (A) 03/03/2016 1311   APPEARANCEUR CLEAR (A) 03/03/2016 1311   LABSPEC 1.003 (L) 03/03/2016 1311   PHURINE 7.0 03/03/2016 1311   GLUCOSEU NEGATIVE 03/03/2016 1311   HGBUR 1+ (A) 03/03/2016 1311   BILIRUBINUR NEGATIVE 03/03/2016 1311   KETONESUR NEGATIVE 03/03/2016 1311   PROTEINUR NEGATIVE 03/03/2016 1311   NITRITE NEGATIVE 03/03/2016 1311   LEUKOCYTESUR NEGATIVE 03/03/2016 1311    Pertinent Imaging: none  Assessment & Plan:  The patient recently had an episode of hematuria. She has a smoking history. She requires foot surgery on Monday. I would like to organize a CT scan and have her come back for cystoscopy. I will delay the next visit a few weeks because of her foot. We will proceed accordingly  I also resected the urine for culture because she reported strong smelling urine 1. Microscopic hematuria 2. Mild stress incontinence 3. Nighttime frequency   - Urinalysis, Complete - CULTURE, URINE COMPREHENSIVE  Return in about 4 weeks (around 12/26/2016).  Reece Packer, MD  [facility] [address] [place], [place] [address] [phone]

## 2016-12-01 ENCOUNTER — Encounter: Payer: Self-pay | Admitting: Sports Medicine

## 2016-12-01 DIAGNOSIS — L97501 Non-pressure chronic ulcer of other part of unspecified foot limited to breakdown of skin: Secondary | ICD-10-CM | POA: Diagnosis not present

## 2016-12-02 ENCOUNTER — Telehealth: Payer: Self-pay | Admitting: Sports Medicine

## 2016-12-02 NOTE — Telephone Encounter (Signed)
Post op check phone made to patient. Patient states that she is doing good on way to cardiology appointment with husband. I reminded her to stay off her foot and to continue with ice and elevation and pain meds as needed. Patient to follow up in 1 week -Dr. Cannon Kettle

## 2016-12-03 LAB — CULTURE, URINE COMPREHENSIVE

## 2016-12-03 NOTE — Progress Notes (Signed)
DOS 12.11.2017 Closure/Stitch Up Open Areas of Previous Incision of Top and Bottom of Right Foot

## 2016-12-09 ENCOUNTER — Ambulatory Visit (INDEPENDENT_AMBULATORY_CARE_PROVIDER_SITE_OTHER): Payer: Self-pay | Admitting: Sports Medicine

## 2016-12-09 ENCOUNTER — Encounter: Payer: Self-pay | Admitting: Sports Medicine

## 2016-12-09 DIAGNOSIS — Z9889 Other specified postprocedural states: Secondary | ICD-10-CM

## 2016-12-09 DIAGNOSIS — M79671 Pain in right foot: Secondary | ICD-10-CM

## 2016-12-09 NOTE — Progress Notes (Signed)
Subjective: Hailey Patterson is a 60 y.o. female patient seen today in office for POV #1, DOS 12/01/2016 primary closure of area of wound dehiscence RIGHT FOOT. Patient admits pain at surgical site relieved by pain medicine, denies calf pain, denies headache, chest pain, shortness of breath, nausea, vomiting, fever, or chills. No other issues noted.   Patient reports that she has been compliant staying off of her foot as instructed.  Patient Active Problem List   Diagnosis Date Noted  . Overweight (BMI 25.0-29.9) 08/17/2016  . Carpal tunnel syndrome 07/03/2015  . Abnormal breast finding 04/06/2015  . Itch of skin 04/06/2015  . General patient noncompliance 02/22/2015  . Bunion 09/08/2014  . Bilateral cataracts 09/07/2014  . Breast cancer, right breast ([place]) 09/07/2014  . Chest pain 07/25/2014  . Current tobacco use 10/14/2013  . 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 amoxicillin-clavulanate (AUGMENTIN) 875-125 MG tablet Take 1 tablet by mouth 2 (two) times daily.    . citalopram (CELEXA) 20 MG tablet Take 20 mg by mouth daily.    Marland Kitchen docusate sodium (COLACE) 100 MG capsule Take 100 mg by mouth 2 (two) times daily.    Marland Kitchen docusate sodium (COLACE) 100 MG capsule Take 1 capsule (100 mg total) by mouth 2 (two) times daily. (Patient not taking: Reported on 11/28/2016) 10 capsule 0  . oxyCODONE-acetaminophen (PERCOCET) 10-325 MG tablet Take 1 tablet by mouth every 4 (four) hours as needed for pain. 30 tablet 0  . oxyCODONE-acetaminophen (PERCOCET) 10-325 MG tablet Take 1 tablet by mouth every 6 (six) hours as needed for pain.    . promethazine (PHENERGAN) 25 MG tablet Take 25 mg by mouth every 6 (six) hours as needed for nausea or vomiting.    . promethazine (PHENERGAN) 25 MG tablet Take 1 tablet (25 mg total) by mouth every 6 (six) hours as needed for nausea or  vomiting. (Patient not taking: Reported on 11/28/2016) 30 tablet 0  . promethazine (PHENERGAN) 25 MG tablet Take 25 mg by mouth every 8 (eight) hours as needed for nausea or vomiting.     No current facility-administered medications on file prior to visit.     Allergies  Allergen Reactions  . Macrobid [Nitrofurantoin] Shortness Of Breath and Nausea And Vomiting  . Sulfa Antibiotics Nausea And Vomiting  . Codeine Nausea And Vomiting  . Meperidine Nausea And Vomiting  . Morphine Nausea And Vomiting    Objective: There were no vitals filed for this visit.  General: No acute distress, AAOx3  Right foot: Sutures intact with no gapping or dehiscence at surgical sites, mild swelling to right forefoot, no erythema, no warmth, no drainage, no signs of infection noted, Capillary fill time <3 seconds in all digits, gross sensation present via light touch to right foot. No pain with calf compression.   Assessment and Plan:  Problem List Items Addressed This Visit    None    Visit Diagnoses    Status post right foot surgery    -  Primary   Right foot pain          -Patient seen and evaluated -Applied dry sterile dressing to surgical site right foot secured with ACE wrap and stockinet  -Advised patient to make sure to keep dressings clean, dry, and intact to right surgical site, removing the ACE as needed  -Advised patient to continue with Protective  post-op boot or surgical shoe and NWB with knee scooter -Advised patient to ice and elevate as necessary  -Continue with pain medicines as prescribed as needed -Will plan for dressing change and incisional site check at next office visit. Sutures will stay and as long as necessary until complete healing is achieved. Advised patient that she is to remain nonweightbearing until sutures are removed. In the meantime, patient to call office if any issues or problems arise.   Landis Martins, DPM

## 2016-12-16 ENCOUNTER — Ambulatory Visit (INDEPENDENT_AMBULATORY_CARE_PROVIDER_SITE_OTHER): Payer: Self-pay

## 2016-12-16 VITALS — Temp 97.6°F

## 2016-12-16 DIAGNOSIS — M216X1 Other acquired deformities of right foot: Secondary | ICD-10-CM

## 2016-12-16 DIAGNOSIS — L905 Scar conditions and fibrosis of skin: Secondary | ICD-10-CM

## 2016-12-16 DIAGNOSIS — Z9889 Other specified postprocedural states: Secondary | ICD-10-CM

## 2016-12-16 NOTE — Progress Notes (Signed)
Pt presents post op: DOS 12.11.2017 Closure/Stitch Up Open Areas of Previous Incision of Top and Bottom of Right Foot   Dressing changed, minimal drainage noted on gauze, sutures are all intact and wound edges approximated and aligned. She is still non-weight bearing to surgical foot, denies s/s of infection  Advised to continue with elevation and NWB in CAM boot. Redressed foot and she is to follow up with Dr Cannon Kettle next week, sooner if acute symptoms arise

## 2016-12-17 ENCOUNTER — Ambulatory Visit: Admission: RE | Admit: 2016-12-17 | Payer: Medicaid Other | Source: Ambulatory Visit

## 2016-12-17 ENCOUNTER — Ambulatory Visit
Admission: RE | Admit: 2016-12-17 | Discharge: 2016-12-17 | Disposition: A | Discharge status: Home / Self Care | Payer: Medicaid Other | Source: Ambulatory Visit | Attending: Urology | Admitting: Urology

## 2016-12-17 DIAGNOSIS — D3502 Benign neoplasm of left adrenal gland: Secondary | ICD-10-CM | POA: Insufficient documentation

## 2016-12-17 DIAGNOSIS — I700 Atherosclerosis of aorta: Secondary | ICD-10-CM | POA: Insufficient documentation

## 2016-12-17 DIAGNOSIS — N3289 Other specified disorders of bladder: Secondary | ICD-10-CM | POA: Insufficient documentation

## 2016-12-17 DIAGNOSIS — R310 Gross hematuria: Secondary | ICD-10-CM | POA: Insufficient documentation

## 2016-12-17 LAB — POCT I-STAT CREATININE: CREATININE: 0.9 mg/dL (ref 0.44–1.00)

## 2016-12-17 MED ORDER — IOPAMIDOL (ISOVUE-300) INJECTION 61%
125.0000 mL | Freq: Once | INTRAVENOUS | Status: AC | PRN
  Administered 2016-12-17: 125 mL via INTRAVENOUS

## 2016-12-23 ENCOUNTER — Ambulatory Visit (INDEPENDENT_AMBULATORY_CARE_PROVIDER_SITE_OTHER): Payer: Self-pay | Admitting: Sports Medicine

## 2016-12-23 DIAGNOSIS — T8130XA Disruption of wound, unspecified, initial encounter: Secondary | ICD-10-CM

## 2016-12-23 DIAGNOSIS — Z9889 Other specified postprocedural states: Secondary | ICD-10-CM

## 2016-12-23 DIAGNOSIS — M79671 Pain in right foot: Secondary | ICD-10-CM

## 2016-12-23 NOTE — Progress Notes (Signed)
Subjective: Hailey Patterson is a 61 y.o. female patient seen today in office for POV #3, DOS 12/01/2016 primary closure of area of wound dehiscence RIGHT FOOT. Patient admits pain at surgical site relieved by pain medicine however sometimes at night has sharp pains and can not get foot comfortable, denies calf pain, denies headache, chest pain, shortness of breath, nausea, vomiting, fever, or chills. No other issues noted.   Patient reports that she has been compliant staying off of her foot as instructed and using rolling knee scooter.   Patient Active Problem List   Diagnosis Date Noted  . Overweight (BMI 25.0-29.9) 08/17/2016  . Carpal tunnel syndrome 07/03/2015  . Abnormal breast finding 04/06/2015  . Itch of skin 04/06/2015  . General patient noncompliance 02/22/2015  . Bunion 09/08/2014  . Bilateral cataracts 09/07/2014  . Breast cancer, right breast ([place]) 09/07/2014  . Chest pain 07/25/2014  . Current tobacco use 10/14/2013  . 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 amoxicillin-clavulanate (AUGMENTIN) 875-125 MG tablet Take 1 tablet by mouth 2 (two) times daily.    . citalopram (CELEXA) 20 MG tablet Take 20 mg by mouth daily.    Marland Kitchen docusate sodium (COLACE) 100 MG capsule Take 100 mg by mouth 2 (two) times daily.    Marland Kitchen docusate sodium (COLACE) 100 MG capsule Take 1 capsule (100 mg total) by mouth 2 (two) times daily. (Patient not taking: Reported on 11/28/2016) 10 capsule 0  . oxyCODONE-acetaminophen (PERCOCET) 10-325 MG tablet Take 1 tablet by mouth every 4 (four) hours as needed for pain. 30 tablet 0  . oxyCODONE-acetaminophen (PERCOCET) 10-325 MG tablet Take 1 tablet by mouth every 6 (six) hours as needed for pain.    . promethazine (PHENERGAN) 25 MG tablet Take 25 mg by mouth every 6 (six) hours as needed for nausea or vomiting.    . promethazine  (PHENERGAN) 25 MG tablet Take 1 tablet (25 mg total) by mouth every 6 (six) hours as needed for nausea or vomiting. (Patient not taking: Reported on 11/28/2016) 30 tablet 0  . promethazine (PHENERGAN) 25 MG tablet Take 25 mg by mouth every 8 (eight) hours as needed for nausea or vomiting.     No current facility-administered medications on file prior to visit.     Allergies  Allergen Reactions  . Macrobid [Nitrofurantoin] Shortness Of Breath and Nausea And Vomiting  . Sulfa Antibiotics Nausea And Vomiting  . Codeine Nausea And Vomiting  . Meperidine Nausea And Vomiting  . Morphine Nausea And Vomiting    Objective: There were no vitals filed for this visit.  General: No acute distress, AAOx3  Right foot: Sutures intact with no gapping or dehiscence at surgical sites, mild swelling to right forefoot, no erythema, no warmth, no drainage, no signs of infection noted, Capillary fill time <3 seconds in all digits, gross sensation present via light touch to right foot. No pain with calf compression.   Assessment and Plan:  Problem List Items Addressed This Visit    None    Visit Diagnoses    Status post right foot surgery    -  Primary   Right foot pain       Wound dehiscence          -Patient seen and evaluated _Attempted to remove 1 of the dorsal sutures and noticed 1 gap so left all  dorsal and plantar sutures in place -Applied dry sterile dressing to surgical site right foot secured with ACE wrap and stockinet  -Advised patient to make sure to keep dressings clean, dry, and intact to right surgical site, removing the ACE as needed  -Advised patient to continue with Protective post-op boot or surgical shoe and NWB with knee scooter -Advised patient to ice and elevate as necessary  -Continue with pain medicines as prescribed as needed -Will plan for possible suture removal again at next visit. Sutures will stay and as long as necessary until complete healing is achieved. Advised  patient that she is to remain nonweightbearing until sutures are removed. In the meantime, patient to call office if any issues or problems arise.   Landis Martins, DPM

## 2016-12-30 ENCOUNTER — Ambulatory Visit (INDEPENDENT_AMBULATORY_CARE_PROVIDER_SITE_OTHER): Payer: Self-pay | Admitting: Podiatry

## 2016-12-30 DIAGNOSIS — Z9889 Other specified postprocedural states: Secondary | ICD-10-CM

## 2016-12-30 DIAGNOSIS — L905 Scar conditions and fibrosis of skin: Secondary | ICD-10-CM

## 2016-12-30 NOTE — Progress Notes (Signed)
She presents today for a postop visit date of surgery was 12/01/2016 she relates considerable pain to the right foot status post excision soft tissue lesion plantarly and reexcision of lesion dorsal aspect.  Objective: Vital signs are stable she is alert and oriented 3 sutures are intact margins appear to be well coapted dorsally requesting we removed the sutures dorsally at the leave those in plantarly.  Assessment: Well-healing surgical foot.  Plan: Sutures were removed today she will continue range of motion exercises and will follow-up with Dr. Cannon Kettle in a week or 2.

## 2017-01-05 ENCOUNTER — Encounter: Payer: Self-pay | Admitting: Urology

## 2017-01-05 ENCOUNTER — Ambulatory Visit (INDEPENDENT_AMBULATORY_CARE_PROVIDER_SITE_OTHER): Payer: Medicaid Other | Admitting: Urology

## 2017-01-05 VITALS — BP 178/91 | HR 106 | Ht 61.0 in | Wt 150.0 lb

## 2017-01-05 DIAGNOSIS — R3129 Other microscopic hematuria: Secondary | ICD-10-CM | POA: Diagnosis not present

## 2017-01-05 LAB — MICROSCOPIC EXAMINATION

## 2017-01-05 LAB — URINALYSIS, COMPLETE
BILIRUBIN UA: NEGATIVE
GLUCOSE, UA: NEGATIVE
Ketones, UA: NEGATIVE
Leukocytes, UA: NEGATIVE
NITRITE UA: NEGATIVE
PROTEIN UA: NEGATIVE
Specific Gravity, UA: <1.005 — ABNORMAL LOW (ref 1.005–1.030)
UUROB: 0.2 mg/dL (ref 0.2–1.0)
pH, UA: 6 (ref 5.0–7.5)

## 2017-01-05 MED ORDER — LIDOCAINE HCL 2 % EX GEL
1.0000 "application " | Freq: Once | CUTANEOUS | Status: AC
  Administered 2017-01-05: 1 via URETHRAL

## 2017-01-05 MED ORDER — CIPROFLOXACIN HCL 500 MG PO TABS
500.0000 mg | ORAL_TABLET | Freq: Once | ORAL | Status: AC
  Administered 2017-01-05: 500 mg via ORAL

## 2017-01-05 NOTE — Progress Notes (Signed)
01/05/2017 9:18 AM   Hailey Patterson 1956/02/23 [id number]  Referring provider: Sharyne Peach, MD [address] [place], [place] [address]  Chief Complaint  Patient presents with  . Cysto    HPI: I was consulted to assess the patient's microscopic hematuria. Of note she saw a little bit of blood 2 weeks ago. She had no associated pain.  She likely had a reflux surgery at age 61.   She does smoke. She does not take daily aspirin or blood thinners.  She has rare stress incontinence not wearing a pad. She voids every 2 hours and gets up once or twice a night  Today Frequency stable No visible blood in the urine CT scan normal  The patient underwent cystoscopy. Consent given. Sterile technique utilized with Leksell scope. The bladder mucosa and trigone were normal. 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 There was no erythema cancer or foreign body. Urine was clear from ureters. She tolerated the procedure well      PMH: Past Medical History:  Diagnosis Date  . Anxiety   . Breast cancer [facility])     Surgical History: Past Surgical History:  Procedure Laterality Date  . ABDOMINAL HYSTERECTOMY    . CESAREAN SECTION    . COLONOSCOPY N/A 05/01/2015   Procedure: COLONOSCOPY;  Surgeon: Josefine Class, MD;  Location: [facility] ENDOSCOPY;  Service: Endoscopy;  Laterality: N/A;  . EXOSTECTECTOMY TOE Right 09/29/2016   Procedure: EXOSTECTECTOMY TOE 3RD RIGHT TOE;  Surgeon: Landis Martins, DPM;  Location: [facility];  Service: Podiatry;  Laterality: Right;  . FOOT SURGERY     bilateral feet  . KIDNEY SURGERY    . MASS EXCISION Right 09/29/2016   Procedure: EXCISION BENIGN LESION 1.0 CM SUB 3 RIGHT;  Surgeon: Landis Martins, DPM;  Location: [facility];  Service: Podiatry;  Laterality: Right;  . OSTECTOMY Right 09/29/2016   Procedure: BASE WEDGE OSTECTOMY FIRST RIGHT TOE;  Surgeon: Landis Martins, DPM;  Location: [facility];  Service: Podiatry;  Laterality: Right;  . TONSILLECTOMY      Home Medications:  Allergies as of 01/05/2017      Reactions   Macrobid [nitrofurantoin] Shortness Of Breath, Nausea And Vomiting   Sulfa Antibiotics Nausea And Vomiting   Codeine Nausea And Vomiting   Meperidine Nausea And Vomiting   Morphine Nausea And Vomiting      Medication List    as of 01/05/2017  9:18 AM   You have not been prescribed any medications.     Allergies:  Allergies  Allergen Reactions  . Macrobid [Nitrofurantoin] Shortness Of Breath and Nausea And Vomiting  . Sulfa Antibiotics Nausea And Vomiting  . Codeine Nausea And Vomiting  . Meperidine Nausea And Vomiting  . Morphine Nausea And Vomiting    Family History: Family History  Problem Relation Age of Onset  . Bladder Cancer Neg Hx   . Kidney cancer Neg Hx     Social History:  reports that she has been smoking Cigarettes.  She has been smoking about 0.50 packs per day. She has never used smokeless tobacco. She reports that she does not drink alcohol or use drugs.  ROS:                                        Physical Exam: BP (!) 178/91   Pulse Marland Kitchen)  106   Ht 5\' 1"  (1.549 m)   Wt 150 lb (68 kg)   BMI 28.34 kg/m     Laboratory Data: Lab Results  Component Value Date   WBC 6.7 03/03/2016   HGB 14.7 03/03/2016   HCT 42.7 03/03/2016   MCV 93.7 03/03/2016   PLT 202 03/03/2016    Lab Results  Component Value Date   CREATININE 0.90 12/17/2016    No results found for: PSA  No results found for: TESTOSTERONE  No results found for: HGBA1C  Urinalysis    Component Value Date/Time   COLORURINE STRAW (A) 03/03/2016 1311   APPEARANCEUR Clear 11/28/2016 1412   LABSPEC 1.003 (L) 03/03/2016 1311   PHURINE 7.0 03/03/2016 1311   GLUCOSEU Negative 11/28/2016 1412   HGBUR 1+ (A) 03/03/2016 1311   BILIRUBINUR Negative 11/28/2016 1412   KETONESUR NEGATIVE 03/03/2016 1311   PROTEINUR Negative  11/28/2016 1412   PROTEINUR NEGATIVE 03/03/2016 1311   NITRITE Negative 11/28/2016 1412   NITRITE NEGATIVE 03/03/2016 1311   LEUKOCYTESUR Negative 11/28/2016 1412    Pertinent Imaging: Normal ct  Assessment & Plan:  The patient has benign hematuria. She'll be seen on a when necessary basis  1. Microscopic hematuria  - Urinalysis, Complete - ciprofloxacin (CIPRO) tablet 500 mg; Take 1 tablet (500 mg total) by mouth once. - lidocaine (XYLOCAINE) 2 % jelly 1 application; Place 1 application into the urethra once.   No Follow-up on file.  Reece Packer, MD  [facility] [address] [place], [place] [address] [phone]

## 2017-01-06 ENCOUNTER — Ambulatory Visit (INDEPENDENT_AMBULATORY_CARE_PROVIDER_SITE_OTHER): Payer: Self-pay | Admitting: Sports Medicine

## 2017-01-06 DIAGNOSIS — M79671 Pain in right foot: Secondary | ICD-10-CM

## 2017-01-06 DIAGNOSIS — Z9889 Other specified postprocedural states: Secondary | ICD-10-CM

## 2017-01-06 DIAGNOSIS — L905 Scar conditions and fibrosis of skin: Secondary | ICD-10-CM

## 2017-01-06 NOTE — Progress Notes (Signed)
    Subjective: Hailey Patterson is a 61 y.o. female patient seen today in office for POV #5, DOS 12/01/2016 primary closure of area of wound dehiscence RIGHT FOOT. Patient admits pain at surgical site relieved by pain medicine however since walking on her foot "chasing the Kitten" without boot and scooter 1 time a few days ago had pain and swelling, denies calf pain, denies headache, chest pain, shortness of breath, nausea, vomiting, fever, or chills. No other issues noted.   Patient Active Problem List   Diagnosis Date Noted  . Overweight (BMI 25.0-29.9) 08/17/2016  . Carpal tunnel syndrome 07/03/2015  . Abnormal breast finding 04/06/2015  . Itch of skin 04/06/2015  . General patient noncompliance 02/22/2015  . Bunion 09/08/2014  . Bilateral cataracts 09/07/2014  . Breast cancer, right breast ([place]) 09/07/2014  . Chest pain 07/25/2014  . Current tobacco use 10/14/2013  . 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 Tarsal tunnel syndrome 07/15/2013    No current outpatient prescriptions on file prior to visit.   No current facility-administered medications on file prior to visit.     Allergies  Allergen Reactions  . Macrobid [Nitrofurantoin] Shortness Of Breath and Nausea And Vomiting  . Sulfa Antibiotics Nausea And Vomiting  . Codeine Nausea And Vomiting  . Meperidine Nausea And Vomiting  . Morphine Nausea And Vomiting    Objective: There were no vitals filed for this visit.  General: No acute distress, AAOx3  Right foot: Sutures intact with mild gapping or no dehiscence at surgical sites, mild swelling to right forefoot, no erythema, no warmth, no drainage, no signs of infection noted, Capillary fill time <3 seconds in all digits, gross sensation present via light touch to right foot. No pain with calf compression.   Assessment and Plan:  Problem List Items Addressed This Visit    None    Visit Diagnoses    Status post right foot surgery    -  Primary   Right foot pain        Scar tissue          -Patient seen and evaluated -A few sutures were removed all others left in place  -Applied dry sterile dressing to surgical site right foot secured with ACE wrap and stockinet  -Advised patient to make sure to keep dressings clean, dry, and intact to right surgical site, removing the ACE as needed  -Advised patient to continue with Protective post-op boot or surgical shoe and NWB with knee scooter -Advised patient to ice and elevate as necessary  -Continue with pain medicines as prescribed as needed -Will plan for possible suture removal again at next visit. Sutures will stay and as long as necessary until complete healing is achieved. Advised patient that she is to remain nonweightbearing until sutures are removed. May consider oasis at incision to help with healing. Encourage nutrition and smoking cessation. In the meantime, patient to call office if any issues or problems arise.   Landis Martins, DPM

## 2017-01-13 ENCOUNTER — Ambulatory Visit (INDEPENDENT_AMBULATORY_CARE_PROVIDER_SITE_OTHER): Payer: Self-pay | Admitting: Sports Medicine

## 2017-01-13 DIAGNOSIS — Z9889 Other specified postprocedural states: Secondary | ICD-10-CM

## 2017-01-13 DIAGNOSIS — M79671 Pain in right foot: Secondary | ICD-10-CM

## 2017-01-13 NOTE — Progress Notes (Signed)
      Subjective: Hailey Patterson is a 61 y.o. female patient seen today in office for POV #6, DOS 12/01/2016 primary closure of area of wound dehiscence RIGHT FOOT. Patient admits pain at surgical site, states that she has increased her protein and is taking a multivitamin to help her foot heal; denies calf pain, denies headache, chest pain, shortness of breath, nausea, vomiting, fever, or chills. No other issues noted.   Patient Active Problem List   Diagnosis Date Noted  . Overweight (BMI 25.0-29.9) 08/17/2016  . Carpal tunnel syndrome 07/03/2015  . Abnormal breast finding 04/06/2015  . Itch of skin 04/06/2015  . General patient noncompliance 02/22/2015  . Bunion 09/08/2014  . Bilateral cataracts 09/07/2014  . Breast cancer, right breast ([place]) 09/07/2014  . Chest pain 07/25/2014  . Current tobacco use 10/14/2013  . 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 Tarsal tunnel syndrome 07/15/2013    No current outpatient prescriptions on file prior to visit.   No current facility-administered medications on file prior to visit.     Allergies  Allergen Reactions  . Macrobid [Nitrofurantoin] Shortness Of Breath and Nausea And Vomiting  . Sulfa Antibiotics Nausea And Vomiting  . Codeine Nausea And Vomiting  . Meperidine Nausea And Vomiting  . Morphine Nausea And Vomiting    Objective: There were no vitals filed for this visit.  General: No acute distress, AAOx3  Right foot: Sutures intact with minimal gapping or no dehiscence at surgical sites, mild swelling to right forefoot, no erythema, no warmth, no drainage, no signs of infection noted, Capillary fill time <3 seconds in all digits, gross sensation present via light touch to right foot. No pain with calf compression.   Assessment and Plan:  Problem List Items Addressed This Visit    None    Visit Diagnoses    Status post right foot surgery    -  Primary   Right foot pain          -Patient seen and evaluated -A few  sutures were removed all others left in place at plantar incision since patient could tolerate complete removal today; sutures are cut but not removed due to pain   -Applied steristrips and dry sterile dressing to surgical site right foot secured with ACE wrap and stockinet  -Advised patient to make sure to keep dressings clean, dry, and intact to right surgical site, removing the ACE as needed  -Advised patient to continue with Protective post-op boot or surgical shoe and NWB with knee scooter -Advised patient to ice and elevate as necessary  -Continue with pain medicines as prescribed as needed -Will plan for completing suture removal at next visit. Encouraged patient to take pain medication prior to visit to assist with pain control with suture removal. Encouraged continued nutrition and smoking cessation. In the meantime, patient to call office if any issues or problems arise.   Landis Martins, DPM

## 2017-01-20 ENCOUNTER — Ambulatory Visit (INDEPENDENT_AMBULATORY_CARE_PROVIDER_SITE_OTHER): Payer: Self-pay | Admitting: Sports Medicine

## 2017-01-20 DIAGNOSIS — T8130XA Disruption of wound, unspecified, initial encounter: Secondary | ICD-10-CM

## 2017-01-20 DIAGNOSIS — M79671 Pain in right foot: Secondary | ICD-10-CM

## 2017-01-20 DIAGNOSIS — Z9889 Other specified postprocedural states: Secondary | ICD-10-CM

## 2017-01-20 NOTE — Progress Notes (Signed)
      Subjective: Hailey Patterson is a 61 y.o. female patient seen today in office for POV #7 DOS 12/01/2016 primary closure of area of wound dehiscence RIGHT FOOT. Patient admits pain at surgical site, took her pain medication in preparation for remainder of suture removal; denies calf pain, denies headache, chest pain, shortness of breath, nausea, vomiting, fever, or chills. No other issues noted.   Patient Active Problem List   Diagnosis Date Noted  . Overweight (BMI 25.0-29.9) 08/17/2016  . Carpal tunnel syndrome 07/03/2015  . Abnormal breast finding 04/06/2015  . Itch of skin 04/06/2015  . General patient noncompliance 02/22/2015  . Bunion 09/08/2014  . Bilateral cataracts 09/07/2014  . Breast cancer, right breast ([place]) 09/07/2014  . Chest pain 07/25/2014  . Current tobacco use 10/14/2013  . 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 Tarsal tunnel syndrome 07/15/2013    No current outpatient prescriptions on file prior to visit.   No current facility-administered medications on file prior to visit.     Allergies  Allergen Reactions  . Macrobid [Nitrofurantoin] Shortness Of Breath and Nausea And Vomiting  . Sulfa Antibiotics Nausea And Vomiting  . Codeine Nausea And Vomiting  . Meperidine Nausea And Vomiting  . Morphine Nausea And Vomiting    Objective: There were no vitals filed for this visit.  General: No acute distress, AAOx3  Right foot: Sutures pproximally in place with minimal gapping or no dehiscence at surgical site plantarly, mild swelling to right forefoot, no erythema, no warmth, no drainage, no signs of infection noted, Capillary fill time <3 seconds in all digits, gross sensation present via light touch to right foot. No pain with calf compression.   Assessment and Plan:  Problem List Items Addressed This Visit    None    Visit Diagnoses    Status post right foot surgery    -  Primary   Right foot pain       Wound dehiscence          -Patient seen  and evaluated -Remainder of sutures were removed  -Applied steristrips and dry sterile dressing to surgical site right foot secured with ACE wrap and stockinet  -Advised patient to make sure to keep dressings clean, dry, and intact to right surgical site x 1 week then after may shower as normal allowing strips to fall off -Advised patient to weightbear with CAM boot for bathroom and food. No standing more than 20 mins at a time. No other activities allowed  -Advised patient to ice and elevate as necessary  -Continue with pain medicines as prescribed as needed -Will plan for transitioning out of boot and increasing activity at next visit. Encouraged continued nutrition and smoking cessation. In the meantime, patient to call office if any issues or problems arise.   Landis Martins, DPM

## 2017-01-27 ENCOUNTER — Encounter: Payer: Self-pay | Admitting: Sports Medicine

## 2017-01-27 ENCOUNTER — Ambulatory Visit (INDEPENDENT_AMBULATORY_CARE_PROVIDER_SITE_OTHER): Payer: Self-pay | Admitting: Sports Medicine

## 2017-01-27 DIAGNOSIS — T8130XA Disruption of wound, unspecified, initial encounter: Secondary | ICD-10-CM

## 2017-01-27 DIAGNOSIS — Z9889 Other specified postprocedural states: Secondary | ICD-10-CM

## 2017-01-27 DIAGNOSIS — M79671 Pain in right foot: Secondary | ICD-10-CM

## 2017-01-27 NOTE — Progress Notes (Signed)
      Subjective: Hailey Patterson is a 61 y.o. female patient seen today in office for POV #8 DOS 12/01/2016 primary closure of area of wound dehiscence RIGHT FOOT. Patient states that she took her dressings off to shower and felt some of the steristrips release and felt a pull like the incision opened up today at 1130am. States that she has been using boot and walking on heel. Admits pain at surgical site, took Tylenol before today's visit, denies calf pain, denies headache, chest pain, shortness of breath, nausea, vomiting, fever, or chills. No other issues noted.   Patient Active Problem List   Diagnosis Date Noted  . Overweight (BMI 25.0-29.9) 08/17/2016  . Carpal tunnel syndrome 07/03/2015  . Abnormal breast finding 04/06/2015  . Itch of skin 04/06/2015  . General patient noncompliance 02/22/2015  . Bunion 09/08/2014  . Bilateral cataracts 09/07/2014  . Breast cancer, right breast ([place]) 09/07/2014  . Chest pain 07/25/2014  . Current tobacco use 10/14/2013  . 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 Tarsal tunnel syndrome 07/15/2013    No current outpatient prescriptions on file prior to visit.   No current facility-administered medications on file prior to visit.     Allergies  Allergen Reactions  . Macrobid [Nitrofurantoin] Shortness Of Breath and Nausea And Vomiting  . Sulfa Antibiotics Nausea And Vomiting  . Codeine Nausea And Vomiting  . Meperidine Nausea And Vomiting  . Morphine Nausea And Vomiting    Objective: There were no vitals filed for this visit.  General: No acute distress, AAOx3  Right foot:Mild dry blood with central dehiscence at surgical site plantarly that measures 2cm in length, 0.1 cm wide and 0.4cm deep in the form of a crease, mild swelling to right forefoot, no erythema, no warmth, no drainage, no signs of infection noted, Capillary fill time <3 seconds in all digits, gross sensation present via light touch to right foot. No pain with calf  compression.   Assessment and Plan:  Problem List Items Addressed This Visit    None    Visit Diagnoses    Status post right foot surgery    -  Primary   Right foot pain       Wound dehiscence          -Patient seen and evaluated -Incisions site plantar with mild dehisance was cleansed and debrided to healthy bleeding then Endoform 2x2 wound matrix was placed ref # [id number], a small portion of the graft used secured with steristrips and adaptic and dry sterile dressing to surgical site right foot secured with ACE wrap and stockinet  -Advised patient to make sure to keep dressings clean, dry, and intact to right surgical site -Advised patient to return to non-weightbearing with rolling knee scooter -Advised patient to ice and elevate as necessary  -Continue with pain medicines as prescribed as needed -Will plan for possible re-application of endoform at next visit if remains open. Encouraged continued nutrition and smoking cessation. In the meantime, patient to call office if any issues or problems arise.   Landis Martins, DPM

## 2017-02-03 ENCOUNTER — Ambulatory Visit (INDEPENDENT_AMBULATORY_CARE_PROVIDER_SITE_OTHER): Payer: Self-pay | Admitting: Sports Medicine

## 2017-02-03 ENCOUNTER — Encounter: Payer: Self-pay | Admitting: Sports Medicine

## 2017-02-03 DIAGNOSIS — T8130XA Disruption of wound, unspecified, initial encounter: Secondary | ICD-10-CM

## 2017-02-03 DIAGNOSIS — Z9889 Other specified postprocedural states: Secondary | ICD-10-CM

## 2017-02-03 DIAGNOSIS — M79671 Pain in right foot: Secondary | ICD-10-CM

## 2017-02-03 NOTE — Progress Notes (Signed)
      Subjective: Hailey Patterson is a 61 y.o. female patient seen today in office for POV #9 DOS 12/01/2016 primary closure of area of wound dehiscence RIGHT FOOT. Patient states that she wants to know how long this will take to heal because will have to help her daughter. States that she has been using boot and rolling knee scooter. Admits pain that is ocassional, denies calf pain, denies headache, chest pain, shortness of breath, nausea, vomiting, fever, or chills. No other issues noted.   Patient Active Problem List   Diagnosis Date Noted  . Overweight (BMI 25.0-29.9) 08/17/2016  . Carpal tunnel syndrome 07/03/2015  . Abnormal breast finding 04/06/2015  . Itch of skin 04/06/2015  . General patient noncompliance 02/22/2015  . Bunion 09/08/2014  . Bilateral cataracts 09/07/2014  . Breast cancer, right breast ([place]) 09/07/2014  . Chest pain 07/25/2014  . Current tobacco use 10/14/2013  . 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 Tarsal tunnel syndrome 07/15/2013    No current outpatient prescriptions on file prior to visit.   No current facility-administered medications on file prior to visit.     Allergies  Allergen Reactions  . Macrobid [Nitrofurantoin] Shortness Of Breath and Nausea And Vomiting  . Sulfa Antibiotics Nausea And Vomiting  . Codeine Nausea And Vomiting  . Meperidine Nausea And Vomiting  . Morphine Nausea And Vomiting    Objective: There were no vitals filed for this visit.  General: No acute distress, AAOx3  Right foot:Mild dry blood with central dehiscence at surgical site plantarly that measures 1.8cm in length, 0.1 cm wide and 0.4cm deep in the form of a crease, mild swelling to right forefoot, no erythema, no warmth, no drainage, no signs of infection noted, Capillary fill time <3 seconds in all digits, gross sensation present via light touch to right foot. No pain with calf compression.   Assessment and Plan:  Problem List Items Addressed This Visit     None    Visit Diagnoses    Wound dehiscence    -  Primary   Status post right foot surgery       Right foot pain          -Patient seen and evaluated -Incisions site plantar with mild dehisance was cleansed and debrided to healthy bleeding then Endoform 2x2 wound matrix was placed ref # [id number], a small portion of the graft used secured with dermabond, steristrips and adaptic and dry sterile dressing to surgical site right foot secured with ACE wrap and stockinet  -Advised patient to make sure to keep dressings clean, dry, and intact to right surgical site -Advised continue non-weightbearing with rolling knee scooter -Advised patient to ice and elevate as necessary  -Continue with pain medicines as prescribed as needed -Will plan for possible re-application of endoform with dermabond at next visit if remains open. Encouraged continued nutrition and smoking cessation. In the meantime, patient to call office if any issues or problems arise.   Landis Martins, DPM

## 2017-02-10 ENCOUNTER — Ambulatory Visit (INDEPENDENT_AMBULATORY_CARE_PROVIDER_SITE_OTHER): Payer: Self-pay | Admitting: Sports Medicine

## 2017-02-10 DIAGNOSIS — M79671 Pain in right foot: Secondary | ICD-10-CM

## 2017-02-10 DIAGNOSIS — Z9889 Other specified postprocedural states: Secondary | ICD-10-CM

## 2017-02-10 DIAGNOSIS — T8130XA Disruption of wound, unspecified, initial encounter: Secondary | ICD-10-CM

## 2017-02-10 NOTE — Progress Notes (Signed)
        Subjective: Hailey Patterson is a 61 y.o. female patient seen today in office for POV #10 DOS 12/01/2016 primary closure of area of wound dehiscence RIGHT FOOT. Patient states that she is getting frustrated and did walk the other day for about 1 hour even though I told her to be off her foot. Admits pain that is ocassional and compensation pain, denies calf pain, denies headache, chest pain, shortness of breath, nausea, vomiting, fever, or chills. No other issues noted.   Patient Active Problem List   Diagnosis Date Noted  . Overweight (BMI 25.0-29.9) 08/17/2016  . Carpal tunnel syndrome 07/03/2015  . Abnormal breast finding 04/06/2015  . Itch of skin 04/06/2015  . General patient noncompliance 02/22/2015  . Bunion 09/08/2014  . Bilateral cataracts 09/07/2014  . Breast cancer, right breast ([place]) 09/07/2014  . Chest pain 07/25/2014  . Current tobacco use 10/14/2013  . 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 Tarsal tunnel syndrome 07/15/2013    No current outpatient prescriptions on file prior to visit.   No current facility-administered medications on file prior to visit.     Allergies  Allergen Reactions  . Macrobid [Nitrofurantoin] Shortness Of Breath and Nausea And Vomiting  . Sulfa Antibiotics Nausea And Vomiting  . Codeine Nausea And Vomiting  . Meperidine Nausea And Vomiting  . Morphine Nausea And Vomiting    Objective: There were no vitals filed for this visit.  General: No acute distress, AAOx3  Right foot:Mild dry blood with central dehiscence at surgical site plantarly that measures 2cm in length, 0.1 cm wide and 0.5cm deep in the form of a crease, mild swelling to right forefoot, no erythema, no warmth, no drainage, no signs of infection noted, Capillary fill time <3 seconds in all digits, gross sensation present via light touch to right foot. No pain with calf compression.   Assessment and Plan:  Problem List Items Addressed This Visit    None     Visit Diagnoses    Wound dehiscence    -  Primary   Status post right foot surgery       Right foot pain          -Patient seen and evaluated -Incisions site plantar with mild dehisance was cleansed and debrided to healthy bleeding applied dermabond, steristrips and adaptic and dry sterile dressing to surgical site right foot secured with ACE wrap and stockinet  -Advised patient to make sure to keep dressings clean, dry, and intact to right surgical site -Advised continue non-weightbearing with rolling knee scooter -Advised patient to ice and elevate as necessary  -Continue with pain medicines as prescribed as needed -Will plan for possible grafix at next visit if remains open. Encouraged continued nutrition and smoking cessation. In the meantime, patient to call office if any issues or problems arise.   Landis Martins, DPM

## 2017-02-11 ENCOUNTER — Telehealth: Payer: Self-pay | Admitting: *Deleted

## 2017-02-11 NOTE — Telephone Encounter (Addendum)
-----   Message from [place], [place] sent at 02/10/2017 12:11 PM EST ----- Regarding: Grafix Wound dehiscence with slow healing  S/p wound vac, repeat surgery, and in office wound matrix graft with minimal progress Please put in a request for Grafix to see if it will be approved Thanks Dr. Cannon Kettle. 02/11/2017-Faxed required form, clinicals, and demographics to [facility]. 02/17/2017-Received [facility] fax stating Grafix is not a covered service/supply for Medicaid pt.04/10/2017-[place] - [facility] asked to verify Oxycodone orders. I reviewed orders of 03/24/2017, they say" 21 tablets by mouth every 4 hours prn severe foot pain" and orders are 1-2 tablets every 4 hours prn foot pain. I informed Dominica Severin the orders should be Oxycodone 1-2 tablets every 4 hours prn severe foot pain.

## 2017-02-17 ENCOUNTER — Ambulatory Visit (INDEPENDENT_AMBULATORY_CARE_PROVIDER_SITE_OTHER): Payer: Self-pay | Admitting: Sports Medicine

## 2017-02-17 DIAGNOSIS — T8130XA Disruption of wound, unspecified, initial encounter: Secondary | ICD-10-CM

## 2017-02-17 DIAGNOSIS — M79671 Pain in right foot: Secondary | ICD-10-CM

## 2017-02-17 DIAGNOSIS — Z9889 Other specified postprocedural states: Secondary | ICD-10-CM

## 2017-02-17 NOTE — Progress Notes (Signed)
        Subjective: Hailey Patterson is a 61 y.o. female patient seen today in office for POV #11 DOS 12/01/2016 primary closure of area of wound dehiscence RIGHT FOOT. Patient states that she did do a little bit of walking even though I told her to be off her foot. Admits pain that is ocassional and compensation pain, denies calf pain, denies headache, chest pain, shortness of breath, nausea, vomiting, fever, or chills. No other issues noted.   Patient Active Problem List   Diagnosis Date Noted  . Overweight (BMI 25.0-29.9) 08/17/2016  . Carpal tunnel syndrome 07/03/2015  . Abnormal breast finding 04/06/2015  . Itch of skin 04/06/2015  . General patient noncompliance 02/22/2015  . Bunion 09/08/2014  . Bilateral cataracts 09/07/2014  . Breast cancer, right breast ([place]) 09/07/2014  . Chest pain 07/25/2014  . Current tobacco use 10/14/2013  . 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 Tarsal tunnel syndrome 07/15/2013    No current outpatient prescriptions on file prior to visit.   No current facility-administered medications on file prior to visit.     Allergies  Allergen Reactions  . Macrobid [Nitrofurantoin] Shortness Of Breath and Nausea And Vomiting  . Sulfa Antibiotics Nausea And Vomiting  . Codeine Nausea And Vomiting  . Meperidine Nausea And Vomiting  . Morphine Nausea And Vomiting    Objective: There were no vitals filed for this visit.  General: No acute distress, AAOx3  Right foot:Mild dry blood with central dehiscence at surgical site plantarly that measures 1.8cm in length, 0.1 cm wide and 0.5cm deep in the form of a crease, mild swelling to right forefoot, no erythema, no warmth, no drainage, no signs of infection noted, Capillary fill time <3 seconds in all digits, gross sensation present via light touch to right foot. No pain with calf compression.   Assessment and Plan:  Problem List Items Addressed This Visit    None    Visit Diagnoses    Wound dehiscence     -  Primary   Status post right foot surgery       Right foot pain          -Patient seen and evaluated -Incision site plantar with mild dehisance was cleansed and debrided to healthy bleeding applied dermabond, steristrips and dry sterile dressing to surgical site right foot secured with ACE wrap and stockinet  -Advised patient to make sure to keep dressings clean, dry, and intact to right surgical site -Advised continue non-weightbearing with rolling knee scooter -Advised patient to ice and elevate as necessary  -Continue with pain medicines as prescribed as needed -Will plan for possible grafix at next visit if remains open. We are AWAITING APPROVAL FOR GRAFT. Encouraged continued nutrition and smoking cessation. In the meantime, patient to call office if any issues or problems arise.   Landis Martins, DPM

## 2017-02-17 NOTE — Telephone Encounter (Signed)
Thank you. I will discuss with patient when she comes to office next week -Dr. Cannon Kettle

## 2017-02-24 ENCOUNTER — Ambulatory Visit (INDEPENDENT_AMBULATORY_CARE_PROVIDER_SITE_OTHER): Payer: Self-pay | Admitting: Sports Medicine

## 2017-02-24 DIAGNOSIS — T8130XA Disruption of wound, unspecified, initial encounter: Secondary | ICD-10-CM

## 2017-02-24 DIAGNOSIS — M79671 Pain in right foot: Secondary | ICD-10-CM

## 2017-02-24 DIAGNOSIS — Z9889 Other specified postprocedural states: Secondary | ICD-10-CM

## 2017-02-24 NOTE — Progress Notes (Signed)
          Subjective: Hailey Patterson is a 61 y.o. female patient seen today in office for POV #12 DOS 12/01/2016 primary closure of area of wound dehiscence RIGHT FOOT. Patient states that she has a little pain across the top of her right foot. Denies calf pain, denies headache, chest pain, shortness of breath, nausea, vomiting, fever, or chills. No other issues noted.   Patient Active Problem List   Diagnosis Date Noted  . Overweight (BMI 25.0-29.9) 08/17/2016  . Carpal tunnel syndrome 07/03/2015  . Abnormal breast finding 04/06/2015  . Itch of skin 04/06/2015  . General patient noncompliance 02/22/2015  . Bunion 09/08/2014  . Bilateral cataracts 09/07/2014  . Breast cancer, right breast ([place]) 09/07/2014  . Chest pain 07/25/2014  . Current tobacco use 10/14/2013  . 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 Tarsal tunnel syndrome 07/15/2013    No current outpatient prescriptions on file prior to visit.   No current facility-administered medications on file prior to visit.     Allergies  Allergen Reactions  . Macrobid [Nitrofurantoin] Shortness Of Breath and Nausea And Vomiting  . Sulfa Antibiotics Nausea And Vomiting  . Codeine Nausea And Vomiting  . Meperidine Nausea And Vomiting  . Morphine Nausea And Vomiting    Objective: There were no vitals filed for this visit.  General: No acute distress, AAOx3  Right foot:Mild dry blood with central dehiscence at surgical site plantarly that measures 1.5 cm in length, 0.1 cm wide and 0.3cm deep in the form of a crease, mild swelling to right forefoot, no erythema, no warmth, no drainage, no signs of infection noted, Capillary fill time <3 seconds in all digits, gross sensation present via light touch to right foot. No pain with calf compression.   Assessment and Plan:  Problem List Items Addressed This Visit    None    Visit Diagnoses    Wound dehiscence    -  Primary   Status post right foot surgery       Right foot pain           -Patient seen and evaluated -Incision site plantar with mild dehisance was cleansed and debrided to healthy bleeding applied dermabond, steristrips and dry sterile dressing to surgical site right foot secured with ACE wrap and stockinet  -Advised patient to make sure to keep dressings clean, dry, and intact to right surgical site -Advised patient may weightbear in house 20 mins max of each hour and continue with rolling knee scooter for longer distance -Advised patient to ice and elevate as necessary  -Continue with pain medicines as prescribed as needed -Patient was not approved for Grafix. Encouraged continued nutrition and smoking cessation. In the meantime, patient to call office if any issues or problems arise.   Landis Martins, DPM

## 2017-03-03 ENCOUNTER — Ambulatory Visit (INDEPENDENT_AMBULATORY_CARE_PROVIDER_SITE_OTHER): Payer: Medicaid Other | Admitting: Sports Medicine

## 2017-03-03 DIAGNOSIS — Z1231 Encounter for screening mammogram for malignant neoplasm of breast: Secondary | ICD-10-CM | POA: Insufficient documentation

## 2017-03-03 DIAGNOSIS — T8130XA Disruption of wound, unspecified, initial encounter: Secondary | ICD-10-CM

## 2017-03-03 DIAGNOSIS — Z9889 Other specified postprocedural states: Secondary | ICD-10-CM

## 2017-03-03 DIAGNOSIS — M79671 Pain in right foot: Secondary | ICD-10-CM

## 2017-03-03 NOTE — Progress Notes (Signed)
Subjective: Hailey Patterson is a 61 y.o. female patient seen today in office for POV #13 DOS 12/01/2016 primary closure of area of wound dehiscence RIGHT FOOT. Patient states that she has a little swelling to top of right foot and decided to stay off her foot instead of trying it. Denies calf pain, denies headache, chest pain, shortness of breath, nausea, vomiting, fever, or chills. No other issues noted.   Patient Active Problem List   Diagnosis Date Noted  . Overweight (BMI 25.0-29.9) 08/17/2016  . Carpal tunnel syndrome 07/03/2015  . Abnormal breast finding 04/06/2015  . Itch of skin 04/06/2015  . General patient noncompliance 02/22/2015  . Bunion 09/08/2014  . Bilateral cataracts 09/07/2014  . Breast cancer, right breast ([place]) 09/07/2014  . Chest pain 07/25/2014  . Current tobacco use 10/14/2013  . 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 Tarsal tunnel syndrome 07/15/2013    No current outpatient prescriptions on file prior to visit.   No current facility-administered medications on file prior to visit.     Allergies  Allergen Reactions  . Macrobid [Nitrofurantoin] Shortness Of Breath and Nausea And Vomiting  . Sulfa Antibiotics Nausea And Vomiting  . Codeine Nausea And Vomiting  . Meperidine Nausea And Vomiting  . Morphine Nausea And Vomiting    Objective: There were no vitals filed for this visit.  General: No acute distress, AAOx3  Right foot:central dehiscence at surgical site plantarly that measures approximately 1cm in length, 0.1 cm wide and 0.3cm deep in the form of a crease that appears to have filled in and healed deeply, mild swelling to right forefoot, no erythema, no warmth, no drainage, no signs of infection noted, Capillary fill time <3 seconds in all digits, gross sensation present via light touch to right foot. No pain with calf compression.   Assessment and Plan:  Problem List Items Addressed This Visit    None    Visit Diagnoses    Wound dehiscence     -  Primary   Status post right foot surgery       Right foot pain          -Patient seen and evaluated -Incision site plantar with mild dehiscence/healed crease applied, steristrips and dry sterile dressing to surgical site right foot secured with ACE wrap and stockinet  -Advised patient to make sure to keep dressings clean, dry, and intact to right surgical site -Advised patient will limit weightbearing now and next visit will progress to in house Weightbearing 20 mins max of each hour and continue with rolling knee scooter for longer distance and progress to getting foot wet with shower -Advised patient to ice and elevate as necessary  -Continue with pain medicines as prescribed as needed - Encouraged continued nutrition and smoking cessation. In the meantime, patient to call office if any issues or problems arise.   Landis Martins, DPM

## 2017-03-10 ENCOUNTER — Encounter: Payer: Self-pay | Admitting: Sports Medicine

## 2017-03-10 ENCOUNTER — Ambulatory Visit (INDEPENDENT_AMBULATORY_CARE_PROVIDER_SITE_OTHER): Payer: Self-pay | Admitting: Sports Medicine

## 2017-03-10 DIAGNOSIS — Z9889 Other specified postprocedural states: Secondary | ICD-10-CM

## 2017-03-10 DIAGNOSIS — T8130XA Disruption of wound, unspecified, initial encounter: Secondary | ICD-10-CM

## 2017-03-10 DIAGNOSIS — M79671 Pain in right foot: Secondary | ICD-10-CM

## 2017-03-10 NOTE — Progress Notes (Signed)
Subjective: Hailey Patterson is a 61 y.o. female patient seen today in office for POV #14 DOS 12/01/2016 primary closure of area of wound dehiscence RIGHT FOOT. Patient states that she has a little swelling to top of right foot and did notice a little yellow drainage with home dressing change. Denies calf pain, denies headache, chest pain, shortness of breath, nausea, vomiting, fever, or chills. No other issues noted.   Patient Active Problem List   Diagnosis Date Noted  . Overweight (BMI 25.0-29.9) 08/17/2016  . Carpal tunnel syndrome 07/03/2015  . Abnormal breast finding 04/06/2015  . Itch of skin 04/06/2015  . General patient noncompliance 02/22/2015  . Bunion 09/08/2014  . Bilateral cataracts 09/07/2014  . Breast cancer, right breast ([place]) 09/07/2014  . Chest pain 07/25/2014  . Current tobacco use 10/14/2013  . 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 Tarsal tunnel syndrome 07/15/2013    No current outpatient prescriptions on file prior to visit.   No current facility-administered medications on file prior to visit.     Allergies  Allergen Reactions  . Macrobid [Nitrofurantoin] Shortness Of Breath and Nausea And Vomiting  . Sulfa Antibiotics Nausea And Vomiting  . Codeine Nausea And Vomiting  . Meperidine Nausea And Vomiting  . Morphine Nausea And Vomiting    Objective: There were no vitals filed for this visit.  General: No acute distress, AAOx3  Right foot:central dehiscence at surgical site plantarly remains healed with a crease to area, mild swelling to right forefoot, no erythema, no warmth, no drainage, no signs of infection noted, Capillary fill time <3 seconds in all digits, gross sensation present via light touch to right foot. No pain with calf compression.   Assessment and Plan:  Problem List Items Addressed This Visit    None    Visit Diagnoses    Wound dehiscence    -  Primary   Status post right foot surgery       Right foot pain          -Patient  seen and evaluated -Right foot wound site plantar remains as a healed crease applied stockinet  -Advised patient may shower as normal -May weightbear with boot and then next week slowly go to tennis shoe -May start using to splint on 4th toe  -Advised patient to ice and elevate as necessary  -Continue with pain medicines as prescribed as needed - Encouraged continued nutrition and smoking cessation -Return in 2 weeks for final check on healed wound site on right. In the meantime, patient to call office if any issues or problems arise.   Landis Martins, DPM

## 2017-03-24 ENCOUNTER — Encounter: Payer: Self-pay | Admitting: Sports Medicine

## 2017-03-24 ENCOUNTER — Ambulatory Visit (INDEPENDENT_AMBULATORY_CARE_PROVIDER_SITE_OTHER): Payer: Medicaid Other | Admitting: Sports Medicine

## 2017-03-24 DIAGNOSIS — R609 Edema, unspecified: Secondary | ICD-10-CM

## 2017-03-24 DIAGNOSIS — T8130XA Disruption of wound, unspecified, initial encounter: Secondary | ICD-10-CM

## 2017-03-24 DIAGNOSIS — Z9889 Other specified postprocedural states: Secondary | ICD-10-CM

## 2017-03-24 DIAGNOSIS — M79671 Pain in right foot: Secondary | ICD-10-CM

## 2017-03-24 MED ORDER — OXYCODONE-ACETAMINOPHEN 5-325 MG PO TABS: 21.0000 | ORAL_TABLET | ORAL | 0 refills | Status: DC | PRN

## 2017-03-24 NOTE — Progress Notes (Signed)
   Subjective: Hailey Patterson is a 61 y.o. female patient seen today in office for POV #15 DOS 12/01/2016 primary closure of area of wound dehiscence RIGHT FOOT. Patient states that she has a little swelling to top of right foot and pain especially now since in normal shoe. Reports that she did apply steri-strips to dorsal incision site. Denies calf pain, denies headache, chest pain, shortness of breath, nausea, vomiting, fever, or chills. No other issues noted.   Patient Active Problem List   Diagnosis Date Noted  . Overweight (BMI 25.0-29.9) 08/17/2016  . Carpal tunnel syndrome 07/03/2015  . Abnormal breast finding 04/06/2015  . Itch of skin 04/06/2015  . General patient noncompliance 02/22/2015  . Bunion 09/08/2014  . Bilateral cataracts 09/07/2014  . Breast cancer, right breast ([place]) 09/07/2014  . Chest pain 07/25/2014  . Current tobacco use 10/14/2013  . 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 Tarsal tunnel syndrome 07/15/2013    No current outpatient prescriptions on file prior to visit.   No current facility-administered medications on file prior to visit.     Allergies  Allergen Reactions  . Macrobid [Nitrofurantoin] Shortness Of Breath and Nausea And Vomiting  . Sulfa Antibiotics Nausea And Vomiting  . Codeine Nausea And Vomiting  . Meperidine Nausea And Vomiting  . Morphine Nausea And Vomiting    Objective: There were no vitals filed for this visit.  General: No acute distress, AAOx3  Right foot:Dorsal incision healed, central dehiscence at surgical site plantarly remains healed with a crease to area, mild swelling to right forefoot, no erythema, no warmth, no drainage, no signs of infection noted, Capillary fill time <3 seconds in all digits, gross sensation present via light touch to right foot. General pain with palpation to right foot. No pain with calf compression.   Assessment and Plan:  Problem List Items Addressed This Visit    None    Visit Diagnoses     Wound dehiscence    -  Primary   Status post right foot surgery       Right foot pain       Swelling          -Patient seen and evaluated -Right foot dorsal and wound site plantar aspect remains as a healed crease applied stockinet  -Dispensed compression anklet to assist with controlling swelling  -Continue with splint on 4th toe at night -Advised patient to ice and elevate as necessary and contrast baths  -Continue with pain medicines as prescribed as needed -Return in 4 weeks for final check on healed wound site on right. In the meantime, patient to call office if any issues or problems arise.   Landis Martins, DPM

## 2017-04-21 ENCOUNTER — Ambulatory Visit: Payer: Medicaid Other | Admitting: Sports Medicine

## 2017-05-12 ENCOUNTER — Ambulatory Visit (INDEPENDENT_AMBULATORY_CARE_PROVIDER_SITE_OTHER): Payer: Medicaid Other | Admitting: Sports Medicine

## 2017-05-12 ENCOUNTER — Ambulatory Visit (INDEPENDENT_AMBULATORY_CARE_PROVIDER_SITE_OTHER): Payer: Medicaid Other

## 2017-05-12 DIAGNOSIS — L905 Scar conditions and fibrosis of skin: Secondary | ICD-10-CM

## 2017-05-12 DIAGNOSIS — M2041 Other hammer toe(s) (acquired), right foot: Secondary | ICD-10-CM | POA: Diagnosis not present

## 2017-05-12 DIAGNOSIS — R609 Edema, unspecified: Secondary | ICD-10-CM

## 2017-05-12 DIAGNOSIS — M216X1 Other acquired deformities of right foot: Secondary | ICD-10-CM | POA: Diagnosis not present

## 2017-05-12 DIAGNOSIS — T8484XA Pain due to internal orthopedic prosthetic devices, implants and grafts, initial encounter: Secondary | ICD-10-CM

## 2017-05-12 NOTE — Patient Instructions (Addendum)

## 2017-05-12 NOTE — Progress Notes (Signed)
   Subjective: Hailey Patterson is a 61 y.o. female patient seen today in office for POV #16 DOS 12/01/2016 primary closure of area of wound dehiscence RIGHT FOOT. Patient states that bottom is healed. Patient states that she has a little swelling to top of right foot and pain. Admits scar and contracture at right 4th toe and pain at 5th hammertoe. No other issues noted.   States that her mother passed away last month.   Patient Active Problem List   Diagnosis Date Noted  . Overweight (BMI 25.0-29.9) 08/17/2016  . Carpal tunnel syndrome 07/03/2015  . Abnormal breast finding 04/06/2015  . Itch of skin 04/06/2015  . General patient noncompliance 02/22/2015  . Bunion 09/08/2014  . Bilateral cataracts 09/07/2014  . Breast cancer, right breast ([place]) 09/07/2014  . Chest pain 07/25/2014  . Current tobacco use 10/14/2013  . 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 oxyCODONE-acetaminophen (PERCOCET) 5-325 MG tablet Take 21 tablets by mouth every 4 (four) hours as needed for severe pain. 30 tablet 0   No current facility-administered medications on file prior to visit.     Allergies  Allergen Reactions  . Macrobid [Nitrofurantoin] Shortness Of Breath and Nausea And Vomiting  . Sulfa Antibiotics Nausea And Vomiting  . Codeine Nausea And Vomiting  . Meperidine Nausea And Vomiting  . Morphine Nausea And Vomiting    Objective: There were no vitals filed for this visit.  General: No acute distress, AAOx3  Right foot:Dorsal incision healed, central dehiscence at surgical site plantarly remains healed with a crease to area, mild swelling to right midfoot with palpable screw, no erythema, no warmth, no drainage, no signs of infection noted, Capillary fill time <3 seconds in all digits, gross sensation present via light touch to right foot. Scar at 4th toe and  hammertoe 5th toe on right. No pain with calf compression.   Xrays right foot proximal in plate broken with dorsal prominence, all other hardware intact, 5th hammertoe.   Assessment and Plan:  Problem List Items Addressed This Visit    None    Visit Diagnoses    Swelling    -  Primary   Relevant Orders   DG Foot Complete Right (Completed)   Painful orthopaedic hardware ([place])       Hammer toe of right foot       Scar tissue          -Patient seen and evaluated -Xrays reviewed  -Right foot dorsal and wound site plantar aspect remains as a healed  -Patient opt for surgical management. Consent obtained for removal of hardware, 4-5 hammertoe repair with scar at 4th toe. Pre and Post op course explained. Risks, benefits, alternatives explained. No guarantees given or implied. Surgical booking slip submitted and provided patient with Surgical packet and info for [facility]. -Patient to be nonweightbearing and to use post op shoe which she already has for 2 weeks  -Patient to call for surgery date and discuss with Delydia  -Return after surgery. In the meantime, patient to call office if any issues or problems arise.   Landis Martins, DPM

## 2017-05-20 ENCOUNTER — Telehealth: Payer: Self-pay | Admitting: *Deleted

## 2017-05-20 NOTE — Telephone Encounter (Signed)
"  I need to schedule my surgery."  When would you like to do it?  "I'd like to do it in the next week or two."  She can do it on Monday, June 4.  "That will be fine.  I want to go ahead and get this broke screw out of there and get it over with."  Someone from the surgical center will give you a call with the arrival time on Friday.

## 2017-05-25 ENCOUNTER — Encounter: Payer: Self-pay | Admitting: Sports Medicine

## 2017-05-25 DIAGNOSIS — D492 Neoplasm of unspecified behavior of bone, soft tissue, and skin: Secondary | ICD-10-CM | POA: Diagnosis not present

## 2017-05-25 DIAGNOSIS — M2041 Other hammer toe(s) (acquired), right foot: Secondary | ICD-10-CM | POA: Diagnosis not present

## 2017-05-25 DIAGNOSIS — S91212D Laceration without foreign body of left great toe with damage to nail, subsequent encounter: Secondary | ICD-10-CM | POA: Diagnosis not present

## 2017-05-25 DIAGNOSIS — M2011 Hallux valgus (acquired), right foot: Secondary | ICD-10-CM | POA: Diagnosis not present

## 2017-05-26 ENCOUNTER — Telehealth: Payer: Self-pay | Admitting: Sports Medicine

## 2017-05-26 NOTE — Telephone Encounter (Signed)
Patient reports she is feeling fine. Starting to get a little tingling in the toes. I advised patient that this is normal after a block. Patient asked if she could go out to the store; I advised patient that if she has to go somewhere that she should ride in the back seat with her foot elevated and not put any pressure on the foot and not be out longer than 30 mins; patient expressed understanding. Patient to follow up in office as scheduled on next week for continued post op care. -Dr. Cannon Kettle

## 2017-05-28 NOTE — Progress Notes (Signed)
Removal of screw at 1st metatarsal base, right foot 4-5 hammertoe repair with removal of scar tissue at 4th toe, right foot.

## 2017-06-02 ENCOUNTER — Ambulatory Visit (INDEPENDENT_AMBULATORY_CARE_PROVIDER_SITE_OTHER): Payer: Medicaid Other

## 2017-06-02 ENCOUNTER — Encounter: Payer: Self-pay | Admitting: Sports Medicine

## 2017-06-02 ENCOUNTER — Ambulatory Visit (INDEPENDENT_AMBULATORY_CARE_PROVIDER_SITE_OTHER): Payer: Medicaid Other | Admitting: Sports Medicine

## 2017-06-02 VITALS — BP 143/87 | HR 83 | Temp 98.4°F | Resp 16

## 2017-06-02 DIAGNOSIS — Z9889 Other specified postprocedural states: Secondary | ICD-10-CM

## 2017-06-02 DIAGNOSIS — T8484XA Pain due to internal orthopedic prosthetic devices, implants and grafts, initial encounter: Secondary | ICD-10-CM

## 2017-06-02 DIAGNOSIS — M2041 Other hammer toe(s) (acquired), right foot: Secondary | ICD-10-CM

## 2017-06-02 DIAGNOSIS — L905 Scar conditions and fibrosis of skin: Secondary | ICD-10-CM

## 2017-06-02 DIAGNOSIS — R609 Edema, unspecified: Secondary | ICD-10-CM

## 2017-06-02 NOTE — Progress Notes (Signed)
Subjective: Hailey Patterson is a 61 y.o. female patient seen today in office for POV #1 (DOS 05-25-17), S/P screw removal, removal of scar tissue, hammertoe repair right foot. Patient admits pain 5/10 at surgical site controlled with pain medication, denies calf pain, denies headache, chest pain, shortness of breath, nausea, vomiting, fever, or chills. Patient reports that she is doing ok and is using knee scooter. No other issues noted.   Patient Active Problem List   Diagnosis Date Noted  . Overweight (BMI 25.0-29.9) 08/17/2016  . Carpal tunnel syndrome 07/03/2015  . Abnormal breast finding 04/06/2015  . Itch of skin 04/06/2015  . General patient noncompliance 02/22/2015  . Bunion 09/08/2014  . Bilateral cataracts 09/07/2014  . Breast cancer, right breast ([place]) 09/07/2014  . Chest pain 07/25/2014  . Current tobacco use 10/14/2013  . 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 docusate sodium (COLACE) 100 MG capsule Take 100 mg by mouth 2 (two) times daily as needed for mild constipation.    Marland Kitchen oxyCODONE-acetaminophen (PERCOCET) 10-325 MG tablet Take 1 tablet by mouth every 8 (eight) hours as needed for pain.    . promethazine (PHENERGAN) 25 MG tablet Take 25 mg by mouth every 8 (eight) hours as needed for nausea or vomiting.     No current facility-administered medications on file prior to visit.     Allergies  Allergen Reactions  . Macrobid [Nitrofurantoin] Shortness Of Breath and Nausea And Vomiting  . Sulfa Antibiotics Nausea And Vomiting  . Codeine Nausea And Vomiting  . Meperidine Nausea And Vomiting  . Morphine Nausea And Vomiting    Objective: There were no vitals filed for this visit.  General: No acute distress, AAOx3  Right foot: Sutures intact with no gapping or dehiscence at surgical sites, mild swelling to right foot, no erythema, no  warmth, no drainage, no signs of infection noted, Capillary fill time <3 seconds in all digits, gross sensation present via light touch to right foot.  No pain with calf compression.   Post Op Xray, Right foot:Post op status, Remaining hardware intact. Soft tissue swelling within normal limits for post op status.   Assessment and Plan:  Problem List Items Addressed This Visit    None    Visit Diagnoses    S/P foot surgery, right    -  Primary   Hammertoe of right foot       Relevant Orders   DG Foot Complete Right   Painful orthopaedic hardware ([place])       Swelling       Scar tissue           -Patient seen and evaluated -Xrays reviewed -Applied dry sterile dressing to surgical site right foot secured with ACE wrap and stockinet  -Advised patient to make sure to keep dressings clean, dry, and intact to right surgical site, removing the ACE as needed  -Advised patient to continue with post-op shoe on right foot and NWB with knee scooter until sutures are removed  -Advised patient to limit activity to necessity  -Advised patient to ice and elevate as necessary  -Will plan for suture removal and allowing weightbearing with post op shoe at next office visit. In the meantime, patient to call office if any issues or problems arise.   Landis Martins, DPM

## 2017-06-09 ENCOUNTER — Ambulatory Visit (INDEPENDENT_AMBULATORY_CARE_PROVIDER_SITE_OTHER): Payer: Self-pay | Admitting: Sports Medicine

## 2017-06-09 ENCOUNTER — Encounter: Payer: Self-pay | Admitting: Sports Medicine

## 2017-06-09 DIAGNOSIS — R609 Edema, unspecified: Secondary | ICD-10-CM

## 2017-06-09 DIAGNOSIS — M79671 Pain in right foot: Secondary | ICD-10-CM

## 2017-06-09 DIAGNOSIS — M2041 Other hammer toe(s) (acquired), right foot: Secondary | ICD-10-CM

## 2017-06-09 DIAGNOSIS — T8484XA Pain due to internal orthopedic prosthetic devices, implants and grafts, initial encounter: Secondary | ICD-10-CM

## 2017-06-09 DIAGNOSIS — L905 Scar conditions and fibrosis of skin: Secondary | ICD-10-CM

## 2017-06-09 DIAGNOSIS — Z9889 Other specified postprocedural states: Secondary | ICD-10-CM

## 2017-06-09 MED ORDER — LIDOCAINE 5 % EX PTCH: 1.0000 | MEDICATED_PATCH | CUTANEOUS | 0 refills | Status: DC

## 2017-06-09 NOTE — Progress Notes (Signed)
Subjective: Hailey Patterson is a 61 y.o. female patient seen today in office for POV #2 (DOS 05-25-17), S/P screw removal, removal of scar tissue, hammertoe repair right foot. Patient admits pain at surgical site controlled with pain medication however worsening constant burning pain in the foot, denies calf pain, denies headache, chest pain, shortness of breath, nausea, vomiting, fever, or chills. Patient reports that she is doing ok and is using knee scooter however admits that she did walk on foot. No other issues noted.   Patient Active Problem List   Diagnosis Date Noted  . Overweight (BMI 25.0-29.9) 08/17/2016  . Carpal tunnel syndrome 07/03/2015  . Abnormal breast finding 04/06/2015  . Itch of skin 04/06/2015  . General patient noncompliance 02/22/2015  . Bunion 09/08/2014  . Bilateral cataracts 09/07/2014  . Breast cancer, right breast ([place]) 09/07/2014  . Chest pain 07/25/2014  . Current tobacco use 10/14/2013  . 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 docusate sodium (COLACE) 100 MG capsule Take 100 mg by mouth 2 (two) times daily as needed for mild constipation.    Marland Kitchen oxyCODONE-acetaminophen (PERCOCET) 10-325 MG tablet Take 1 tablet by mouth every 8 (eight) hours as needed for pain.    . promethazine (PHENERGAN) 25 MG tablet Take 25 mg by mouth every 8 (eight) hours as needed for nausea or vomiting.     No current facility-administered medications on file prior to visit.     Allergies  Allergen Reactions  . Macrobid [Nitrofurantoin] Shortness Of Breath and Nausea And Vomiting  . Sulfa Antibiotics Nausea And Vomiting  . Codeine Nausea And Vomiting  . Meperidine Nausea And Vomiting  . Morphine Nausea And Vomiting    Objective: There were no vitals filed for this visit.  General: No acute distress, AAOx3  Right foot: Sutures intact with no  gapping or dehiscence at surgical sites, mild swelling to right foot, no erythema, no warmth, no drainage, no signs of infection noted, Capillary fill time <3 seconds in all digits, gross sensation present via light touch to right foot.  No pain with calf compression.   Assessment and Plan:  Problem List Items Addressed This Visit    None    Visit Diagnoses    S/P foot surgery, right    -  Primary   Relevant Medications   lidocaine (LIDODERM) 5 %   Hammertoe of right foot       Painful orthopaedic hardware ([place])       Swelling       Scar tissue       Foot pain, right       Relevant Medications   lidocaine (LIDODERM) 5 %       -Patient seen and evaluated -Sutures removed  -Applied dry sterile dressing to surgical site right foot secured with ACE wrap and stockinet  -Advised patient to make sure to keep dressings clean, dry, and intact to right surgical site for today and then may shower on tomorrow; Gave steristrips for patient to replace as needed  -Advised patient to continue with post-op shoe on right foot and now may weight-bear; Advised patient not to force her foot in a tennis shoe until the swelling is gone -Advised patient to limit activity to tolerance   -Advised patient to ice and elevate as necessary  -Rx Lidoderm patch to use as needed for pain  -Will  plan post op check and repeat xray at next office visit. In the meantime, patient to call office if any issues or problems arise.   Landis Martins, DPM

## 2017-06-18 ENCOUNTER — Telehealth: Payer: Self-pay | Admitting: *Deleted

## 2017-06-18 NOTE — Telephone Encounter (Signed)
Lidocaine patch denied by insurance. Sent in [place] [facility] for Lidocaine cream 5%. Notified pt

## 2017-06-22 ENCOUNTER — Telehealth: Payer: Self-pay | Admitting: Sports Medicine

## 2017-06-22 NOTE — Telephone Encounter (Signed)
Rye with [facility] called in regards to Rx received via fax on Friday 29 June from [place] for Lidocaine 5% Cream. He is wanting to confirm we wanted the authorization that we wanted the 5% Cream rather than the 2% and or did we want the straight up 5% Cream? Requested a call back at [phone].

## 2017-06-23 ENCOUNTER — Ambulatory Visit (INDEPENDENT_AMBULATORY_CARE_PROVIDER_SITE_OTHER): Payer: Medicaid Other | Admitting: Sports Medicine

## 2017-06-23 ENCOUNTER — Ambulatory Visit (INDEPENDENT_AMBULATORY_CARE_PROVIDER_SITE_OTHER): Payer: Medicaid Other

## 2017-06-23 DIAGNOSIS — M2040 Other hammer toe(s) (acquired), unspecified foot: Secondary | ICD-10-CM | POA: Diagnosis not present

## 2017-06-23 DIAGNOSIS — M2041 Other hammer toe(s) (acquired), right foot: Secondary | ICD-10-CM | POA: Diagnosis not present

## 2017-06-23 DIAGNOSIS — Z9889 Other specified postprocedural states: Secondary | ICD-10-CM

## 2017-06-23 DIAGNOSIS — M779 Enthesopathy, unspecified: Secondary | ICD-10-CM

## 2017-06-23 MED ORDER — METHYLPREDNISOLONE 4 MG PO TBPK: ORAL_TABLET | ORAL | 0 refills | Status: DC

## 2017-06-23 NOTE — Progress Notes (Signed)
Subjective: Hailey Patterson is a 61 y.o. female patient seen today in office for POV #3 (DOS 05-25-17), S/P screw removal, removal of scar tissue, hammertoe repair right foot. Patient admits pain at surgical site and all over the right foot, denies calf pain, denies headache, chest pain, shortness of breath, nausea, vomiting, fever, or chills. Reports that she tried her daughter's pain patch and it did not help. No other issues noted.   Patient Active Problem List   Diagnosis Date Noted  . Overweight (BMI 25.0-29.9) 08/17/2016  . Carpal tunnel syndrome 07/03/2015  . Abnormal breast finding 04/06/2015  . Itch of skin 04/06/2015  . General patient noncompliance 02/22/2015  . Bunion 09/08/2014  . Bilateral cataracts 09/07/2014  . Breast cancer, right breast ([place]) 09/07/2014  . Chest pain 07/25/2014  . Current tobacco use 10/14/2013  . 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 docusate sodium (COLACE) 100 MG capsule Take 100 mg by mouth 2 (two) times daily as needed for mild constipation.    . lidocaine (LIDODERM) 5 % Place 1 patch onto the skin daily. Remove & Discard patch within 12 hours or as directed by MD 30 patch 0  . NONFORMULARY OR COMPOUNDED [place]  Pain Cream Lidocaine 5% Apply 1-2 grams to affected area 3-4 times daily Qty. 120 gm 1 refills    . oxyCODONE-acetaminophen (PERCOCET) 10-325 MG tablet Take 1 tablet by mouth every 8 (eight) hours as needed for pain.    . promethazine (PHENERGAN) 25 MG tablet Take 25 mg by mouth every 8 (eight) hours as needed for nausea or vomiting.     No current facility-administered medications on file prior to visit.     Allergies  Allergen Reactions  . Macrobid [Nitrofurantoin] Shortness Of Breath and Nausea And Vomiting  . Sulfa Antibiotics Nausea And Vomiting  . Codeine Nausea And Vomiting  .  Meperidine Nausea And Vomiting  . Morphine Nausea And Vomiting    Objective: There were no vitals filed for this visit.  General: No acute distress, AAOx3  Right foot: Incisions healing well at surgical sites, mild swelling to right foot, no erythema, no warmth, no drainage, no signs of infection noted, Capillary fill time <3 seconds in all digits, gross sensation present via light touch to right foot. Pain with palpation diffusely on right foot. No pain with calf compression.   Xray right foot hardware intact, no acute findings.   Assessment and Plan:  Problem List Items Addressed This Visit    None    Visit Diagnoses    Hammertoe of right foot    -  Primary   Relevant Orders   DG Foot Complete Right (Completed)   S/P foot surgery, right       Relevant Orders   DG Foot Complete Right (Completed)   Capsulitis       Relevant Medications   methylPREDNISolone (MEDROL DOSEPAK) 4 MG TBPK tablet       -Patient seen and evaluated -Rx Medrol dosepak for pain and swelling -Dispensed compression anklet to use for edema control -Rx Orthotics from [facility] and recommended good supportive shoes  -Advised patient to limit activity to tolerance   -Advised patient to ice and elevate as necessary  -Continue with toe splint at bedtime at right 4th toe and self callus care -Will plan for medication check at next visit. In the  meantime, patient to call office if any issues or problems arise.   Landis Martins, DPM

## 2017-07-21 ENCOUNTER — Ambulatory Visit: Payer: Medicaid Other | Admitting: Sports Medicine

## 2017-08-11 ENCOUNTER — Telehealth: Payer: Self-pay | Admitting: *Deleted

## 2017-08-11 ENCOUNTER — Ambulatory Visit (INDEPENDENT_AMBULATORY_CARE_PROVIDER_SITE_OTHER): Payer: Medicaid Other | Admitting: Sports Medicine

## 2017-08-11 DIAGNOSIS — M79671 Pain in right foot: Secondary | ICD-10-CM

## 2017-08-11 DIAGNOSIS — R609 Edema, unspecified: Secondary | ICD-10-CM

## 2017-08-11 DIAGNOSIS — Z9889 Other specified postprocedural states: Secondary | ICD-10-CM

## 2017-08-11 DIAGNOSIS — M792 Neuralgia and neuritis, unspecified: Secondary | ICD-10-CM

## 2017-08-11 MED ORDER — PREGABALIN 75 MG PO CAPS: 75.0000 mg | ORAL_CAPSULE | Freq: Two times a day (BID) | ORAL | 1 refills | Status: DC

## 2017-08-11 NOTE — Progress Notes (Signed)
Subjective: Hailey Patterson is a 61 y.o. female patient seen today in office for POV #4 (DOS 05-25-17), S/P screw removal, removal of scar tissue, hammertoe repair right foot. Patient admits pain at surgical site and all over the right foot with swelling and a dilated vein at the top of the foot, denies calf pain, denies headache, chest pain, shortness of breath, nausea, vomiting, fever, or chills. Reports that she has not gotten her orthotics. States that she had a lot of things going onH With er family and her husband has lost his brother. No other issues noted.   Patient Active Problem List   Diagnosis Date Noted  . Overweight (BMI 25.0-29.9) 08/17/2016  . Carpal tunnel syndrome 07/03/2015  . Abnormal breast finding 04/06/2015  . Itch of skin 04/06/2015  . General patient noncompliance 02/22/2015  . Bunion 09/08/2014  . Bilateral cataracts 09/07/2014  . Breast cancer, right breast ([place]) 09/07/2014  . Chest pain 07/25/2014  . Current tobacco use 10/14/2013  . 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 docusate sodium (COLACE) 100 MG capsule Take 100 mg by mouth 2 (two) times daily as needed for mild constipation.    . lidocaine (LIDODERM) 5 % Place 1 patch onto the skin daily. Remove & Discard patch within 12 hours or as directed by MD 30 patch 0  . methylPREDNISolone (MEDROL DOSEPAK) 4 MG TBPK tablet Take as instructed 21 tablet 0  . NONFORMULARY OR COMPOUNDED [place]  Pain Cream Lidocaine 5% Apply 1-2 grams to affected area 3-4 times daily Qty. 120 gm 1 refills    . oxyCODONE-acetaminophen (PERCOCET) 10-325 MG tablet Take 1 tablet by mouth every 8 (eight) hours as needed for pain.    . promethazine (PHENERGAN) 25 MG tablet Take 25 mg by mouth every 8 (eight) hours as needed for nausea or vomiting.     No current facility-administered  medications on file prior to visit.     Allergies  Allergen Reactions  . Macrobid [Nitrofurantoin] Shortness Of Breath and Nausea And Vomiting  . Sulfa Antibiotics Nausea And Vomiting  . Codeine Nausea And Vomiting  . Meperidine Nausea And Vomiting  . Morphine Nausea And Vomiting    Objective: There were no vitals filed for this visit.  General: No acute distress, AAOx3  Right foot: Incisions healing well at surgical sites, mild Reactive callusing plantar aspect of right foot with  swelling to right foot, no erythema, no warmth, no drainage, no signs of infection noted, Capillary fill time <3 seconds in all digits, gross sensation present via light touch to right foot. Pain with palpation diffusely on right foot. No pain with calf compression.   Assessment and Plan:  Problem List Items Addressed This Visit    None    Visit Diagnoses    Neuritis    -  Primary   Relevant Medications   pregabalin (LYRICA) 75 MG capsule   S/P foot surgery, right       Swelling       Right foot pain         -Patient seen and evaluated -Rx Lyrica for pain  -Advised vascular consult for enlarged veins. However, patient does not want to accept this consult at this time.  -Continue with  compression anklet to use for edema control , Patient to take previous Rx for Orthotics to [facility] and recommended  good supportive shoes  -Advised patient to limit activity to tolerance   -Advised patient to ice and elevate as necessary  -Continue with toe splint at bedtime at right 4th toe and self callus care as needed -Will plan for medication check at next visit. In the meantime, patient to call office if any issues or problems arise.   Landis Martins, DPM

## 2017-08-11 NOTE — Telephone Encounter (Signed)
Faxed Lyrica orders to [facility].

## 2017-09-10 ENCOUNTER — Telehealth: Payer: Self-pay | Admitting: *Deleted

## 2017-09-10 NOTE — Telephone Encounter (Addendum)
Lyrica is not covered by Medicaid.09/11/2017-I informed pt Dr. Cannon Kettle had ordered an alternate to the Lyrica, generic Cymbalta and I would send it to the [facility]. Pt states understanding.

## 2017-09-10 NOTE — Telephone Encounter (Signed)
We can try the alternative of Duloxetine 60mg  daily x 30 tabs with 2 refills

## 2017-09-11 MED ORDER — DULOXETINE HCL 60 MG PO CPEP: 60.0000 mg | ORAL_CAPSULE | Freq: Every day | ORAL | 2 refills | Status: DC

## 2017-09-15 ENCOUNTER — Ambulatory Visit (INDEPENDENT_AMBULATORY_CARE_PROVIDER_SITE_OTHER): Payer: Medicaid Other | Admitting: Sports Medicine

## 2017-09-15 ENCOUNTER — Encounter: Payer: Self-pay | Admitting: Sports Medicine

## 2017-09-15 DIAGNOSIS — M79671 Pain in right foot: Secondary | ICD-10-CM

## 2017-09-15 DIAGNOSIS — L570 Actinic keratosis: Secondary | ICD-10-CM | POA: Diagnosis not present

## 2017-09-15 DIAGNOSIS — R609 Edema, unspecified: Secondary | ICD-10-CM

## 2017-09-15 DIAGNOSIS — Z9889 Other specified postprocedural states: Secondary | ICD-10-CM | POA: Diagnosis not present

## 2017-09-15 DIAGNOSIS — M792 Neuralgia and neuritis, unspecified: Secondary | ICD-10-CM

## 2017-09-15 MED ORDER — SALICYLIC ACID 17 % EX GEL: Freq: Every day | CUTANEOUS | 0 refills | Status: DC

## 2017-09-15 NOTE — Progress Notes (Signed)
Subjective: Hailey Patterson is a 61 y.o. female patient seen today in office for POV #5 (DOS 05-25-17), S/P screw removal, removal of scar tissue, hammertoe repair right foot. Patient admits pain at surgical site and all over the right foot with swelling and continued pain hard to describe at forefoot even when at rest especially if her foot is in a certain position, denies calf pain, denies headache, chest pain, shortness of breath, nausea, vomiting, fever, or chills. Reports at [facility] she was going to be charged the cash price of which she could not afford. States that she's having to do a lot with helping her daughter who just had bilateral bunion surgery. No other issues noted.   Patient Active Problem List   Diagnosis Date Noted  . Overweight (BMI 25.0-29.9) 08/17/2016  . Carpal tunnel syndrome 07/03/2015  . Abnormal breast finding 04/06/2015  . Itch of skin 04/06/2015  . General patient noncompliance 02/22/2015  . Bunion 09/08/2014  . Bilateral cataracts 09/07/2014  . Breast cancer, right breast ([place]) 09/07/2014  . Chest pain 07/25/2014  . Current tobacco use 10/14/2013  . 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 docusate sodium (COLACE) 100 MG capsule Take 100 mg by mouth 2 (two) times daily as needed for mild constipation.    . DULoxetine (CYMBALTA) 60 MG capsule Take 1 capsule (60 mg total) by mouth daily. 30 capsule 2  . lidocaine (LIDODERM) 5 % Place 1 patch onto the skin daily. Remove & Discard patch within 12 hours or as directed by MD 30 patch 0  . NONFORMULARY OR COMPOUNDED [place]  Pain Cream Lidocaine 5% Apply 1-2 grams to affected area 3-4 times daily Qty. 120 gm 1 refills    . oxyCODONE-acetaminophen (PERCOCET) 10-325 MG tablet Take 1 tablet by mouth every 8 (eight) hours as needed for pain.    . pregabalin (LYRICA)  75 MG capsule Take 1 capsule (75 mg total) by mouth 2 (two) times daily. 60 capsule 1  . promethazine (PHENERGAN) 25 MG tablet Take 25 mg by mouth every 8 (eight) hours as needed for nausea or vomiting.     No current facility-administered medications on file prior to visit.     Allergies  Allergen Reactions  . Macrobid [Nitrofurantoin] Shortness Of Breath and Nausea And Vomiting  . Sulfa Antibiotics Nausea And Vomiting  . Codeine Nausea And Vomiting  . Meperidine Nausea And Vomiting  . Morphine Nausea And Vomiting    Objective: There were no vitals filed for this visit.  General: No acute distress, AAOx3  Right foot: Incisions healed at surgical sites, mild Reactive callusing plantar aspect of right foot with swelling to right foot, no erythema, no warmth, no drainage, no signs of infection noted, Capillary fill time <3 seconds in all digits, gross sensation present via light touch to right foot. Pain with palpation diffusely on right foot at the forefoot. No pain with calf compression.   Assessment and Plan:  Problem List Items Addressed This Visit    None    Visit Diagnoses    Neuritis    -  Primary   Keratosis       Swelling       S/P foot surgery, right       Right foot pain         -Patient seen and evaluated -Patient to start  Cymbalta on Friday since her insurance would not cover Lyrica  -Rx Salacid for keratosis -Advised vascular consult for enlarged veins. However, patient does not want to accept this consult at this time as previously noted  -Continue with  compression anklet to use for edema control -Patient to discuss payment plan with vicki and orthotic charge and if patient desires to have insoles made then will proceed with being casted by rick for forefoot offloading device   -Advised patient to limit activity to tolerance   -Advised patient to ice and elevate as necessary  -Continue with toe splint at bedtime at right 4th toe and self callus care as  needed -Will plan for medication check at next visit. In the meantime, patient to call office if any issues or problems arise.   Landis Martins, DPM

## 2017-10-06 ENCOUNTER — Ambulatory Visit: Payer: Medicaid Other | Admitting: Orthotics

## 2017-10-06 DIAGNOSIS — M779 Enthesopathy, unspecified: Secondary | ICD-10-CM

## 2017-10-06 DIAGNOSIS — M79673 Pain in unspecified foot: Secondary | ICD-10-CM

## 2017-10-06 DIAGNOSIS — R609 Edema, unspecified: Secondary | ICD-10-CM

## 2017-10-06 DIAGNOSIS — L570 Actinic keratosis: Secondary | ICD-10-CM

## 2017-10-07 NOTE — Progress Notes (Signed)
Patient presents with severe foot pain, keratomas, hx of previous foot surgeries, thin fat pad, etc.  Cast for Custom F/O and given discount rate of $200.00 for same.

## 2017-10-27 ENCOUNTER — Ambulatory Visit: Payer: Medicaid Other | Admitting: Orthotics

## 2017-10-27 ENCOUNTER — Ambulatory Visit: Payer: Medicaid Other | Admitting: Sports Medicine

## 2017-10-27 ENCOUNTER — Ambulatory Visit (INDEPENDENT_AMBULATORY_CARE_PROVIDER_SITE_OTHER): Payer: Medicaid Other

## 2017-10-27 ENCOUNTER — Encounter: Payer: Self-pay | Admitting: Sports Medicine

## 2017-10-27 DIAGNOSIS — M2041 Other hammer toe(s) (acquired), right foot: Secondary | ICD-10-CM

## 2017-10-27 DIAGNOSIS — L905 Scar conditions and fibrosis of skin: Secondary | ICD-10-CM

## 2017-10-27 DIAGNOSIS — M7751 Other enthesopathy of right foot: Secondary | ICD-10-CM | POA: Diagnosis not present

## 2017-10-27 DIAGNOSIS — R609 Edema, unspecified: Secondary | ICD-10-CM

## 2017-10-27 DIAGNOSIS — M779 Enthesopathy, unspecified: Secondary | ICD-10-CM

## 2017-10-27 DIAGNOSIS — M792 Neuralgia and neuritis, unspecified: Secondary | ICD-10-CM

## 2017-10-27 DIAGNOSIS — L570 Actinic keratosis: Secondary | ICD-10-CM

## 2017-10-27 DIAGNOSIS — M79671 Pain in right foot: Secondary | ICD-10-CM

## 2017-10-27 DIAGNOSIS — Z9889 Other specified postprocedural states: Secondary | ICD-10-CM

## 2017-10-27 MED ORDER — TRIAMCINOLONE ACETONIDE 10 MG/ML IJ SUSP: 10.0000 mg | Freq: Once | INTRAMUSCULAR | Status: DC

## 2017-10-27 NOTE — Progress Notes (Signed)
Patient came in today to pick up custom made foot orthotics.  The goals were accomplished and the patient reported no dissatisfaction with said orthotics.  Patient was advised of breakin period and how to report any issues. 

## 2017-10-27 NOTE — Progress Notes (Signed)
Subjective: Hailey Patterson is a 61 y.o. female patient seen today in office for POV #6 (DOS 05-25-17), S/P screw removal, removal of scar tissue, hammertoe repair right foot. Patient admits pain at most at 2-3rd toes and all over the right foot with swelling and continued pain, sharp in nature hard to describe at forefoot even worse with walking and orthotic she got today makes pain worse, denies calf pain, denies headache, chest pain, shortness of breath, nausea, vomiting, fever, or chills.  No other issues noted.   Patient Active Problem List   Diagnosis Date Noted  . Overweight (BMI 25.0-29.9) 08/17/2016  . Carpal tunnel syndrome 07/03/2015  . Abnormal breast finding 04/06/2015  . Itch of skin 04/06/2015  . General patient noncompliance 02/22/2015  . Bunion 09/08/2014  . Bilateral cataracts 09/07/2014  . Breast cancer, right breast ([place]) 09/07/2014  . Chest pain 07/25/2014  . Current tobacco use 10/14/2013  . 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 Tarsal tunnel syndrome 07/15/2013    Current Outpatient Medications on File Prior to Visit  Medication Sig Dispense Refill  . docusate sodium (COLACE) 100 MG capsule Take 100 mg by mouth 2 (two) times daily as needed for mild constipation.    . DULoxetine (CYMBALTA) 60 MG capsule Take 1 capsule (60 mg total) by mouth daily. 30 capsule 2  . lidocaine (LIDODERM) 5 % Place 1 patch onto the skin daily. Remove & Discard patch within 12 hours or as directed by MD 30 patch 0  . NONFORMULARY OR COMPOUNDED [place]  Pain Cream Lidocaine 5% Apply 1-2 grams to affected area 3-4 times daily Qty. 120 gm 1 refills    . oxyCODONE-acetaminophen (PERCOCET) 10-325 MG tablet Take 1 tablet by mouth every 8 (eight) hours as needed for pain.    . pregabalin (LYRICA) 75 MG capsule Take 1 capsule (75 mg total) by mouth 2 (two) times daily. 60 capsule 1  . promethazine (PHENERGAN) 25 MG tablet Take 25 mg by mouth every 8 (eight) hours as needed  for nausea or vomiting.    . salicylic acid 17 % gel Apply topically daily. To callus area at bedtime and cover with bandaid 14 g 0   No current facility-administered medications on file prior to visit.     Allergies  Allergen Reactions  . Macrobid [Nitrofurantoin] Shortness Of Breath and Nausea And Vomiting  . Sulfa Antibiotics Nausea And Vomiting  . Codeine Nausea And Vomiting  . Meperidine Nausea And Vomiting  . Morphine Nausea And Vomiting    Objective: There were no vitals filed for this visit.  General: No acute distress, AAOx3  Right foot: Incisions healed at surgical sites, mild Reactive callusing plantar aspect of right foot with swelling to right foot, no erythema, no warmth, no drainage, no signs of infection noted, Capillary fill time <3 seconds in all digits, gross sensation present via light touch to right foot. Pain with palpation diffusely on right foot at the forefoot with most pain with palpation at 2-3 intermetatarsal sapce. No pain with calf compression.   Xrays Hardware intact, mild forefoot swelling, no other acute pathology   Assessment and Plan:  Problem List Items Addressed This Visit    None    Visit Diagnoses    Capsulitis    -  Primary   Relevant Medications   triamcinolone acetonide (KENALOG) 10 MG/ML injection 10 mg   triamcinolone acetonide (KENALOG) 10 MG/ML injection 10 mg (Start on 10/27/2017  8:45 PM)   Foot  pain, right       Relevant Medications   triamcinolone acetonide (KENALOG) 10 MG/ML injection 10 mg   triamcinolone acetonide (KENALOG) 10 MG/ML injection 10 mg (Start on 10/27/2017  8:45 PM)   Other Relevant Orders   DG Foot Complete Right   Neuritis       Relevant Medications   triamcinolone acetonide (KENALOG) 10 MG/ML injection 10 mg   triamcinolone acetonide (KENALOG) 10 MG/ML injection 10 mg (Start on 10/27/2017  8:45 PM)   Keratosis       S/P foot surgery, right       Scar tissue       Relevant Medications   triamcinolone  acetonide (KENALOG) 10 MG/ML injection 10 mg     -Patient seen and evaluated -Xrays reviewed -After oral consent and aseptic prep, injected a mixture containing 1 ml of 2%  plain lidocaine, 1 ml 0.5% plain marcaine, 0.5 ml of kenalog 10 and 0.5 ml of dexamethasone phosphate into 2-3 intermetatarsal space right foot without complication. Post-injection care discussed with patient.  -Parred keratosis right foot and sent orthotics back to be modified -Advised patient to return to CAM boot if pain continues -Rx MRI for further eval possible neuroma/entrapped scar tissue -Continue with  compression anklet to use for edema control -Continue Cymbalta  -Advised patient to limit activity to tolerance   -Advised patient to ice and elevate as necessary  -Will plan for discussion of MRI results at next visit. In the meantime, patient to call office if any issues or problems arise.   Landis Martins, DPM

## 2017-10-28 ENCOUNTER — Telehealth: Payer: Self-pay | Admitting: *Deleted

## 2017-10-28 DIAGNOSIS — D3610 Benign neoplasm of peripheral nerves and autonomic nervous system, unspecified: Secondary | ICD-10-CM

## 2017-10-28 DIAGNOSIS — Z01812 Encounter for preprocedural laboratory examination: Secondary | ICD-10-CM

## 2017-10-28 MED ORDER — SALICYLIC ACID 17 % EX GEL: Freq: Every day | CUTANEOUS | 0 refills | Status: DC

## 2017-10-28 NOTE — Telephone Encounter (Signed)
[employer] requires clinicals for evaluation prior to authorization. I explained to pt the Salicylic acid 67% was a wart remover. Pt states she has used, but it only formed a callous. I told her it would that she needed to gently rough up the callous with a emory board after bathing then apply the salicylic acid. Pt states understanding.

## 2017-10-28 NOTE — Telephone Encounter (Signed)
-----   Message from Landis Martins, [place] sent at 10/27/2017  4:50 PM EST ----- Regarding: MRI R foot and Send Rx for Sal Acid MRI R forefoot check for neuroma between 2-3rd toes on right foot  Check to see if at [facility] if they have Sal acid gel and if so send Rx there to location on [address] since patient said that the [place] location did not have it   Thanks Dr. Cannon Kettle

## 2017-11-02 NOTE — Telephone Encounter (Signed)
[employer] denied MRI right foot with and without contrast. PEER TO PEER message sent to Dr. Cannon Kettle, for Case Service Order: [id number].

## 2017-11-03 ENCOUNTER — Other Ambulatory Visit: Payer: Self-pay | Admitting: Sports Medicine

## 2017-11-03 DIAGNOSIS — M779 Enthesopathy, unspecified: Secondary | ICD-10-CM

## 2017-11-04 NOTE — Telephone Encounter (Signed)
Thank you ;-) Teamwork!!!

## 2017-11-04 NOTE — Telephone Encounter (Signed)
It was approved CPT 36016 MRI approved Auth # [id number] effective 11-7 to 11-27-17 -Dr. Cannon Kettle

## 2017-11-04 NOTE — Telephone Encounter (Signed)
Left message informing pt she could schedule her MRI at [place], Dr. Cannon Kettle had gotten the authorization.

## 2017-11-10 ENCOUNTER — Ambulatory Visit
Admission: RE | Admit: 2017-11-10 | Discharge: 2017-11-10 | Disposition: A | Discharge status: Home / Self Care | Payer: Medicaid Other | Source: Ambulatory Visit | Attending: Sports Medicine | Admitting: Sports Medicine

## 2017-11-10 ENCOUNTER — Ambulatory Visit: Payer: Medicaid Other | Admitting: Sports Medicine

## 2017-11-10 ENCOUNTER — Ambulatory Visit: Payer: Medicaid Other | Admitting: Orthotics

## 2017-11-10 DIAGNOSIS — D3610 Benign neoplasm of peripheral nerves and autonomic nervous system, unspecified: Secondary | ICD-10-CM

## 2017-11-10 DIAGNOSIS — Z01812 Encounter for preprocedural laboratory examination: Secondary | ICD-10-CM

## 2017-11-10 DIAGNOSIS — M2041 Other hammer toe(s) (acquired), right foot: Secondary | ICD-10-CM

## 2017-11-10 DIAGNOSIS — M79671 Pain in right foot: Secondary | ICD-10-CM

## 2017-11-10 DIAGNOSIS — L570 Actinic keratosis: Secondary | ICD-10-CM

## 2017-11-10 MED ORDER — GADOBENATE DIMEGLUMINE 529 MG/ML IV SOLN
14.0000 mL | Freq: Once | INTRAVENOUS | Status: AC | PRN
  Administered 2017-11-10: 14 mL via INTRAVENOUS

## 2017-11-10 NOTE — Progress Notes (Signed)
Patient came in today to pick up custom made foot orthotics.  The goals were accomplished and the patient reported no dissatisfaction with said orthotics.  Patient was advised of breakin period and how to report any issues. 

## 2017-11-20 ENCOUNTER — Telehealth: Payer: Self-pay | Admitting: *Deleted

## 2017-11-20 NOTE — Telephone Encounter (Signed)
Left message informing pt that she needed an appt to discuss results with Dr. Cannon Kettle, and if she had questions for the nurse call again.

## 2017-11-20 NOTE — Telephone Encounter (Signed)
-----   Message from Landis Martins, [place] sent at 11/19/2017  7:46 AM EST ----- Can you let patient know that her MRI showed scar tissue and post-op/surgical changes. If patient wants to further discuss results then she can make an appointment with me to do so Thanks Dr. Chauncey Cruel

## 2017-11-20 NOTE — Telephone Encounter (Signed)
Left message informing pt of Dr. Leeanne Rio statement that I could give pt the results, but if she would like to discuss in more detail and treatment options she would need to come in to be seen.

## 2017-11-24 ENCOUNTER — Ambulatory Visit: Payer: Medicaid Other | Admitting: Sports Medicine

## 2017-11-24 ENCOUNTER — Encounter: Payer: Self-pay | Admitting: Sports Medicine

## 2017-11-24 DIAGNOSIS — L570 Actinic keratosis: Secondary | ICD-10-CM

## 2017-11-24 DIAGNOSIS — D492 Neoplasm of unspecified behavior of bone, soft tissue, and skin: Secondary | ICD-10-CM

## 2017-11-24 DIAGNOSIS — M79671 Pain in right foot: Secondary | ICD-10-CM

## 2017-11-24 DIAGNOSIS — Z9889 Other specified postprocedural states: Secondary | ICD-10-CM

## 2017-11-24 DIAGNOSIS — D3610 Benign neoplasm of peripheral nerves and autonomic nervous system, unspecified: Secondary | ICD-10-CM

## 2017-11-24 DIAGNOSIS — L905 Scar conditions and fibrosis of skin: Secondary | ICD-10-CM

## 2017-11-24 NOTE — Progress Notes (Signed)
Subjective: Hailey Patterson is a 61 y.o. female patient seen today in office for POV #7 (DOS 05-25-17), S/P screw removal, removal of scar tissue, hammertoe repair right foot. Patient admits pain at most at 2-3rd toes and all over the right foot with swelling and continued pain, sharp in nature that hasn't changed since last visit. Patient states that she has not noticed any difference with the orthotics, denies calf pain, denies headache, chest pain, shortness of breath, nausea, vomiting, fever, or chills. Patient is also here for review of MRI results.  No other issues noted.   Patient Active Problem List   Diagnosis Date Noted  . Overweight (BMI 25.0-29.9) 08/17/2016  . Carpal tunnel syndrome 07/03/2015  . Abnormal breast finding 04/06/2015  . Itch of skin 04/06/2015  . General patient noncompliance 02/22/2015  . Bunion 09/08/2014  . Bilateral cataracts 09/07/2014  . Breast cancer, right breast ([place]) 09/07/2014  . Chest pain 07/25/2014  . Current tobacco use 10/14/2013  . 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 Tarsal tunnel syndrome 07/15/2013    Current Outpatient Medications on File Prior to Visit  Medication Sig Dispense Refill  . docusate sodium (COLACE) 100 MG capsule Take 100 mg by mouth 2 (two) times daily as needed for mild constipation.    . DULoxetine (CYMBALTA) 60 MG capsule Take 1 capsule (60 mg total) by mouth daily. 30 capsule 2  . lidocaine (LIDODERM) 5 % Place 1 patch onto the skin daily. Remove & Discard patch within 12 hours or as directed by MD 30 patch 0  . NONFORMULARY OR COMPOUNDED [place]  Pain Cream Lidocaine 5% Apply 1-2 grams to affected area 3-4 times daily Qty. 120 gm 1 refills    . oxyCODONE-acetaminophen (PERCOCET) 10-325 MG tablet Take 1 tablet by mouth every 8 (eight) hours as needed for pain.    . pregabalin (LYRICA) 75 MG capsule Take 1 capsule (75 mg total) by mouth 2 (two) times daily. 60 capsule 1  . promethazine (PHENERGAN) 25  MG tablet Take 25 mg by mouth every 8 (eight) hours as needed for nausea or vomiting.    . salicylic acid 17 % gel Apply topically daily. To callus area at bedtime and cover with bandaid 14 g 0   Current Facility-Administered Medications on File Prior to Visit  Medication Dose Route Frequency Provider Last Rate Last Dose  . triamcinolone acetonide (KENALOG) 10 MG/ML injection 10 mg  10 mg Other Once Landis Martins, DPM      . triamcinolone acetonide (KENALOG) 10 MG/ML injection 10 mg  10 mg Other Once Landis Martins, DPM        Allergies  Allergen Reactions  . Macrobid [Nitrofurantoin] Shortness Of Breath and Nausea And Vomiting  . Sulfa Antibiotics Nausea And Vomiting  . Codeine Nausea And Vomiting  . Meperidine Nausea And Vomiting  . Morphine Nausea And Vomiting    Objective: There were no vitals filed for this visit.  General: No acute distress, AAOx3  Right foot: Incisions are healed at surgical sites, mild Reactive callusing plantar aspect of right foot with swelling to right foot, no erythema, no warmth, no drainage, no signs of infection noted, Capillary fill time <3 seconds in all digits, gross sensation present via light touch to right foot. Pain with palpation diffusely on right foot at the forefoot with most pain with palpation at 2-3 intermetatarsal space. No pain with calf compression.   MRI consistent with scar tissue and post surgical changes  Assessment  and Plan:  Problem List Items Addressed This Visit    None    Visit Diagnoses    Scar tissue    -  Primary   Neuroma       Keratosis       Foot pain, right       S/P foot surgery, right         -Patient seen and evaluated -MRI reviewed -Recommended patient to try EPAT for scar tissue right foot at 2nd intermetatarsal space/areas of most pain; advised patient after treatment to refrain from anti-inflammatories and may need to use CAM boot -Advised patient that I don't think she will get a great benefit from  hardware removal or scar excision   -Complimentarily Parred keratosis right foot, recommend daily skin emollients and offloading custom insoles; advised patient to give more time to break in insoles since she has only been using them for almost 2 weeks -Continue with  compression anklet to use for edema control -Continue Cymbalta  -Advised patient to limit activity to tolerance   -Will plan for EPAT at next visit. In the meantime, patient to call office if any issues or problems arise.   Landis Martins, DPM

## 2017-11-30 ENCOUNTER — Ambulatory Visit: Payer: Medicaid Other

## 2017-12-01 ENCOUNTER — Other Ambulatory Visit: Payer: Self-pay | Admitting: Family Medicine

## 2017-12-01 ENCOUNTER — Ambulatory Visit
Admission: RE | Admit: 2017-12-01 | Discharge: 2017-12-01 | Disposition: A | Discharge status: Home / Self Care | Payer: Medicaid Other | Source: Ambulatory Visit | Attending: Family Medicine | Admitting: Family Medicine

## 2017-12-01 DIAGNOSIS — R1032 Left lower quadrant pain: Secondary | ICD-10-CM

## 2017-12-01 DIAGNOSIS — R932 Abnormal findings on diagnostic imaging of liver and biliary tract: Secondary | ICD-10-CM | POA: Diagnosis not present

## 2017-12-01 DIAGNOSIS — N132 Hydronephrosis with renal and ureteral calculous obstruction: Secondary | ICD-10-CM | POA: Diagnosis not present

## 2017-12-01 DIAGNOSIS — R1030 Lower abdominal pain, unspecified: Secondary | ICD-10-CM | POA: Insufficient documentation

## 2017-12-01 DIAGNOSIS — K5730 Diverticulosis of large intestine without perforation or abscess without bleeding: Secondary | ICD-10-CM | POA: Diagnosis not present

## 2017-12-01 DIAGNOSIS — I700 Atherosclerosis of aorta: Secondary | ICD-10-CM | POA: Diagnosis not present

## 2017-12-01 DIAGNOSIS — R319 Hematuria, unspecified: Secondary | ICD-10-CM | POA: Insufficient documentation

## 2017-12-08 ENCOUNTER — Ambulatory Visit: Payer: Medicaid Other | Admitting: Sports Medicine

## 2017-12-08 DIAGNOSIS — L905 Scar conditions and fibrosis of skin: Secondary | ICD-10-CM

## 2017-12-16 ENCOUNTER — Ambulatory Visit: Payer: Medicaid Other | Admitting: Sports Medicine

## 2017-12-16 DIAGNOSIS — Z9889 Other specified postprocedural states: Secondary | ICD-10-CM

## 2017-12-16 DIAGNOSIS — L570 Actinic keratosis: Secondary | ICD-10-CM

## 2017-12-16 DIAGNOSIS — D3610 Benign neoplasm of peripheral nerves and autonomic nervous system, unspecified: Secondary | ICD-10-CM

## 2017-12-16 DIAGNOSIS — M79671 Pain in right foot: Secondary | ICD-10-CM

## 2017-12-16 DIAGNOSIS — L905 Scar conditions and fibrosis of skin: Secondary | ICD-10-CM

## 2017-12-17 ENCOUNTER — Ambulatory Visit (INDEPENDENT_AMBULATORY_CARE_PROVIDER_SITE_OTHER): Payer: Medicaid Other

## 2017-12-17 DIAGNOSIS — D3610 Benign neoplasm of peripheral nerves and autonomic nervous system, unspecified: Secondary | ICD-10-CM

## 2017-12-17 DIAGNOSIS — L905 Scar conditions and fibrosis of skin: Secondary | ICD-10-CM

## 2017-12-17 DIAGNOSIS — R609 Edema, unspecified: Secondary | ICD-10-CM

## 2017-12-23 ENCOUNTER — Ambulatory Visit: Payer: Medicaid Other

## 2017-12-24 ENCOUNTER — Telehealth: Payer: Self-pay | Admitting: *Deleted

## 2017-12-24 ENCOUNTER — Ambulatory Visit: Payer: Medicaid Other | Admitting: Podiatry

## 2017-12-24 DIAGNOSIS — D3610 Benign neoplasm of peripheral nerves and autonomic nervous system, unspecified: Secondary | ICD-10-CM

## 2017-12-24 DIAGNOSIS — L905 Scar conditions and fibrosis of skin: Secondary | ICD-10-CM

## 2017-12-24 NOTE — Telephone Encounter (Signed)
Pt states she has yellowish brown stuff coming from a pin hole in the top of her foot where a plate is. Pt states she has an appt on 12/29/2017. I spoke pt with pt and she states she called back and is scheduled to day to see Dr. Milinda Pointer at 2:00pm. I apologized for her having to call again and pt states she just knew she couldn't come in tomorrow her husband is going for aneurysm studies and she knew she couldn't wait until tomorrow.

## 2017-12-25 ENCOUNTER — Other Ambulatory Visit: Payer: Self-pay | Admitting: Sports Medicine

## 2017-12-25 MED ORDER — AMOXICILLIN-POT CLAVULANATE 875-125 MG PO TABS: 1.0000 | ORAL_TABLET | Freq: Two times a day (BID) | ORAL | 0 refills | Status: DC

## 2017-12-25 NOTE — Progress Notes (Signed)
Patient was seen yesterday but states that she is concerned about the drainage. Sent Augmentin to pharmacy for drainage and recommended betadine wound care -Dr. Cannon Kettle

## 2017-12-29 ENCOUNTER — Ambulatory Visit: Payer: Medicaid Other | Admitting: Sports Medicine

## 2017-12-29 ENCOUNTER — Encounter: Payer: Self-pay | Admitting: Sports Medicine

## 2017-12-29 ENCOUNTER — Ambulatory Visit (INDEPENDENT_AMBULATORY_CARE_PROVIDER_SITE_OTHER): Payer: Medicaid Other

## 2017-12-29 ENCOUNTER — Other Ambulatory Visit: Payer: Self-pay | Admitting: Sports Medicine

## 2017-12-29 DIAGNOSIS — L905 Scar conditions and fibrosis of skin: Secondary | ICD-10-CM

## 2017-12-29 DIAGNOSIS — L570 Actinic keratosis: Secondary | ICD-10-CM

## 2017-12-29 DIAGNOSIS — T8484XA Pain due to internal orthopedic prosthetic devices, implants and grafts, initial encounter: Secondary | ICD-10-CM

## 2017-12-29 DIAGNOSIS — M79671 Pain in right foot: Secondary | ICD-10-CM

## 2017-12-29 DIAGNOSIS — M216X1 Other acquired deformities of right foot: Secondary | ICD-10-CM

## 2017-12-29 DIAGNOSIS — S91301A Unspecified open wound, right foot, initial encounter: Secondary | ICD-10-CM

## 2017-12-29 NOTE — Progress Notes (Signed)
Patient presents with status post right foot surgery, June 2018. She is getting some pain and swelling in her right foot when walking an don palpation to the area  ESWT administered to area for 2 joules and tolerated well. Re-appointed in 1 week for 2nd treatment

## 2017-12-29 NOTE — Patient Instructions (Signed)
Pre-Operative Instructions  Congratulations, you have decided to take an important step towards improving your quality of life.  You can be assured that the doctors and staff at [facility] will be with you every step of the way.  Here are some important things you should know:  1. Plan to be at the surgery center/hospital at least 1 (one) hour prior to your scheduled time, unless otherwise directed by the surgical center/hospital staff.  You must have a responsible adult accompany you, remain during the surgery and drive you home.  Make sure you have directions to the surgical center/hospital to ensure you arrive on time. 2. If you are having surgery at [facility] or [facility] hospitals, you will need a copy of your medical history and physical form from your family physician within one month prior to the date of surgery. We will give you a form for your primary physician to complete.  3. We make every effort to accommodate the date you request for surgery.  However, there are times where surgery dates or times have to be moved.  We will contact you as soon as possible if a change in schedule is required.   4. No aspirin/ibuprofen for one week before surgery.  If you are on aspirin, any non-steroidal anti-inflammatory medications (Mobic, Aleve, Ibuprofen) should not be taken seven (7) days prior to your surgery.  You make take Tylenol for pain prior to surgery.  5. Medications - If you are taking daily heart and blood pressure medications, seizure, reflux, allergy, asthma, anxiety, pain or diabetes medications, make sure you notify the surgery center/hospital before the day of surgery so they can tell you which medications you should take or avoid the day of surgery. 6. No food or drink after midnight the night before surgery unless directed otherwise by surgical center/hospital staff. 7. No alcoholic beverages 24-hours prior to surgery.  No smoking 24-hours prior or 24-hours after  surgery. 8. Wear loose pants or shorts. They should be loose enough to fit over bandages, boots, and casts. 9. Don't wear slip-on shoes. Sneakers are preferred. 10. Bring your boot with you to the surgery center/hospital.  Also bring crutches or a walker if your physician has prescribed it for you.  If you do not have this equipment, it will be provided for you after surgery. 11. If you have not been contacted by the surgery center/hospital by the day before your surgery, call to confirm the date and time of your surgery. 12. Leave-time from work may vary depending on the type of surgery you have.  Appropriate arrangements should be made prior to surgery with your employer. 13. Prescriptions will be provided immediately following surgery by your doctor.  Fill these as soon as possible after surgery and take the medication as directed. Pain medications will not be refilled on weekends and must be approved by the doctor. 14. Remove nail polish on the operative foot and avoid getting pedicures prior to surgery. 15. Wash the night before surgery.  The night before surgery wash the foot and leg well with water and the antibacterial soap provided. Be sure to pay special attention to beneath the toenails and in between the toes.  Wash for at least three (3) minutes. Rinse thoroughly with water and dry well with a towel.  Perform this wash unless told not to do so by your physician.  Enclosed: 1 Ice pack (please put in freezer the night before surgery)   1 Hibiclens skin cleaner     Pre-op instructions  If you have any questions regarding the instructions, please do not hesitate to call our office.  [place]: [address], , [place] [address] -- [phone]  [place]: [address] [place], [place] [address] -- [phone]  [place]: [address]  [place], [place] [address] -- [phone]  [place]: [address], [address], [place], [place] [address] -- [phone]  Website:  [email or web address] 

## 2017-12-29 NOTE — Progress Notes (Signed)
Subjective: Hailey Patterson is a 62 y.o. female patient seen today in office for POV #8 with me (DOS 05-25-17), S/P screw removal, removal of scar tissue, hammertoe repair right foot. Patient admits that she was doing good with EPAT and then after her 2nd treatment experience severe swelling of which she was placed in unna boot by nurse and after it was removed had some drainage from old incision at 1st MTPJ on right. In addition pain has worsened. Patient has been dressing the area with betadine and is taking her antibiotics. Patient states that she has not noticed any difference with the orthotics and feels like they make her feet hurt worse, denies calf pain, denies headache, chest pain, shortness of breath, nausea, vomiting, fever, or chills.  No other issues noted.   Patient Active Problem List   Diagnosis Date Noted  . Overweight (BMI 25.0-29.9) 08/17/2016  . Carpal tunnel syndrome 07/03/2015  . Abnormal breast finding 04/06/2015  . Itch of skin 04/06/2015  . General patient noncompliance 02/22/2015  . Bunion 09/08/2014  . Bilateral cataracts 09/07/2014  . Breast cancer, right breast ([place]) 09/07/2014  . Chest pain 07/25/2014  . Current tobacco use 10/14/2013  . 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 Tarsal tunnel syndrome 07/15/2013    Current Outpatient Medications on File Prior to Visit  Medication Sig Dispense Refill  . amoxicillin-clavulanate (AUGMENTIN) 875-125 MG tablet Take 1 tablet by mouth 2 (two) times daily. 28 tablet 0  . docusate sodium (COLACE) 100 MG capsule Take 100 mg by mouth 2 (two) times daily as needed for mild constipation.    . DULoxetine (CYMBALTA) 60 MG capsule Take 1 capsule (60 mg total) by mouth daily. 30 capsule 2  . lidocaine (LIDODERM) 5 % Place 1 patch onto the skin daily. Remove & Discard patch within 12 hours or as directed by MD 30 patch 0  . NONFORMULARY OR COMPOUNDED [place]  Pain Cream Lidocaine 5% Apply 1-2 grams to affected  area 3-4 times daily Qty. 120 gm 1 refills    . oxyCODONE-acetaminophen (PERCOCET) 10-325 MG tablet Take 1 tablet by mouth every 8 (eight) hours as needed for pain.    . pregabalin (LYRICA) 75 MG capsule Take 1 capsule (75 mg total) by mouth 2 (two) times daily. 60 capsule 1  . promethazine (PHENERGAN) 25 MG tablet Take 25 mg by mouth every 8 (eight) hours as needed for nausea or vomiting.    . salicylic acid 17 % gel Apply topically daily. To callus area at bedtime and cover with bandaid 14 g 0   Current Facility-Administered Medications on File Prior to Visit  Medication Dose Route Frequency Provider Last Rate Last Dose  . triamcinolone acetonide (KENALOG) 10 MG/ML injection 10 mg  10 mg Other Once Landis Martins, DPM      . triamcinolone acetonide (KENALOG) 10 MG/ML injection 10 mg  10 mg Other Once Landis Martins, DPM        Allergies  Allergen Reactions  . Macrobid [Nitrofurantoin] Shortness Of Breath and Nausea And Vomiting  . Sulfa Antibiotics Nausea And Vomiting  . Codeine Nausea And Vomiting  . Meperidine Nausea And Vomiting  . Morphine Nausea And Vomiting    Objective: There were no vitals filed for this visit.  General: No acute distress, AAOx3  Right foot: Incisions are healed at surgical sites with a pinpoint opening at the incision over the 1st MTPJ with no active drainage or surrounding signs of infection besides swelling, mild  Reactive callusing plantar aspect of right foot with swelling to right foot, no erythema, no warmth, no drainage, no signs of infection noted, Capillary fill time <3 seconds in all digits, gross sensation present via light touch to right foot. Pain with palpation diffusely on right foot at the forefoot with most pain with palpation at 2-3 intermetatarsal space and today at 1st MTPJ. No pain with calf compression.   Xray right foot mild swelling at 1st MTPJ with extensive hardware and post surgical changes.   Assessment and Plan:  Problem List  Items Addressed This Visit    None    Visit Diagnoses    Foot pain, right    -  Primary   Prominent metatarsal head of right foot       Painful orthopaedic hardware ([place])       Wound of right foot       Scar tissue       Keratosis         -Patient seen and evaluated -Xrays reviewed  -Due to continued pain and now new swelling at 1st MTPJ I think patient may benefit from removal of 1st metatarsal hardware and metatarsal head resection  -Recommend patient to continue with betadine at 1st MTPJ pinpoint wound on right -Continue with PO antibiotics  -Advised patient to limit activity to tolerance   -We will keep patient's orthotics to modify after surgery -Return to office after surgery. In the meantime, patient to call office if any issues or problems arise.   Landis Martins, DPM

## 2017-12-31 NOTE — Progress Notes (Signed)
Patient presents with status post right foot surgery, June 2018. She is getting some pain and swelling in her right foot when walking an don palpation to the area  ESWT administered to area for 2 joules and tolerated well. Re-appointed in 1 week for 3rd treatment

## 2017-12-31 NOTE — Progress Notes (Signed)
Patient presents stating that she has had an increase in pain and swelling on the dorsal side of her right foot. She states the swelling had gotten worse over night.   No redness to area, no open wounds noted. Production assistant, radio and told patient ot leave it on for approximately 3-5 days. She is to follow up in the next few days to see Dr Cannon Kettle or one of our other doctors if symptoms are unchanged or worsen. We are going to stop the ESWT treatment at this time until further evaluation from the doctor.

## 2018-01-01 NOTE — Progress Notes (Signed)
Agree with nurse note -Dr. Cannon Kettle

## 2018-01-04 ENCOUNTER — Telehealth: Payer: Self-pay | Admitting: Sports Medicine

## 2018-01-04 NOTE — Telephone Encounter (Signed)
D. Miner - Orthotics/diabetic shoe agent and scheduler states pt is scheduled for tomorrow.

## 2018-01-04 NOTE — Telephone Encounter (Signed)
Left message on pt's home and mobile to continue with the betadine dressings and make an appt to be seen.

## 2018-01-04 NOTE — Telephone Encounter (Signed)
Val Can you check on patient? She has antibiotics and is supposed to be dressing the area with betadine. If bleeding and puss continues she needs to come in to have it checked. When I saw her last week there was no drainage at all but patient kept saying she saw some yellow/green drainage. Please re-assure patient that this drainage will not interfere with the surgery for next week that is planned to remove all hardware.   -Dr. Cannon Kettle

## 2018-01-05 ENCOUNTER — Encounter: Payer: Self-pay | Admitting: Sports Medicine

## 2018-01-05 ENCOUNTER — Ambulatory Visit: Payer: Medicaid Other | Admitting: Sports Medicine

## 2018-01-05 VITALS — BP 143/94 | HR 89 | Resp 16

## 2018-01-05 DIAGNOSIS — S91301A Unspecified open wound, right foot, initial encounter: Secondary | ICD-10-CM

## 2018-01-05 DIAGNOSIS — M79671 Pain in right foot: Secondary | ICD-10-CM

## 2018-01-05 NOTE — Progress Notes (Signed)
Subjective: Hailey Patterson is a 62 y.o. female patient seen today in office for right foot wound check, reports saw puss and drainage over the weekend, denies calf pain, denies headache, chest pain, shortness of breath, nausea, vomiting, fever, or chills.  No other issues noted.   Patient Active Problem List   Diagnosis Date Noted  . Overweight (BMI 25.0-29.9) 08/17/2016  . Carpal tunnel syndrome 07/03/2015  . Abnormal breast finding 04/06/2015  . Itch of skin 04/06/2015  . General patient noncompliance 02/22/2015  . Bunion 09/08/2014  . Bilateral cataracts 09/07/2014  . Breast cancer, right breast ([place]) 09/07/2014  . Chest pain 07/25/2014  . Current tobacco use 10/14/2013  . 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 Tarsal tunnel syndrome 07/15/2013    Current Outpatient Medications on File Prior to Visit  Medication Sig Dispense Refill  . amoxicillin-clavulanate (AUGMENTIN) 875-125 MG tablet Take 1 tablet by mouth 2 (two) times daily. 28 tablet 0  . docusate sodium (COLACE) 100 MG capsule Take 100 mg by mouth 2 (two) times daily as needed for mild constipation.    . DULoxetine (CYMBALTA) 60 MG capsule Take 1 capsule (60 mg total) by mouth daily. 30 capsule 2  . lidocaine (LIDODERM) 5 % Place 1 patch onto the skin daily. Remove & Discard patch within 12 hours or as directed by MD 30 patch 0  . NONFORMULARY OR COMPOUNDED [place]  Pain Cream Lidocaine 5% Apply 1-2 grams to affected area 3-4 times daily Qty. 120 gm 1 refills    . oxyCODONE-acetaminophen (PERCOCET) 10-325 MG tablet Take 1 tablet by mouth every 8 (eight) hours as needed for pain.    . pregabalin (LYRICA) 75 MG capsule Take 1 capsule (75 mg total) by mouth 2 (two) times daily. 60 capsule 1  . promethazine (PHENERGAN) 25 MG tablet Take 25 mg by mouth every 8 (eight) hours as needed for nausea or vomiting.    . salicylic acid 17 % gel Apply topically daily. To callus area at bedtime and cover with bandaid  14 g 0   Current Facility-Administered Medications on File Prior to Visit  Medication Dose Route Frequency Provider Last Rate Last Dose  . triamcinolone acetonide (KENALOG) 10 MG/ML injection 10 mg  10 mg Other Once Landis Martins, DPM      . triamcinolone acetonide (KENALOG) 10 MG/ML injection 10 mg  10 mg Other Once Landis Martins, DPM        Allergies  Allergen Reactions  . Macrobid [Nitrofurantoin] Shortness Of Breath and Nausea And Vomiting  . Sulfa Antibiotics Nausea And Vomiting  . Codeine Nausea And Vomiting  . Meperidine Nausea And Vomiting  . Morphine Nausea And Vomiting    Objective: There were no vitals filed for this visit.  General: No acute distress, AAOx3  Right foot: Incisions are healed at surgical sites with a pinpoint opening at the incision over the 1st MTPJ with no active drainage or surrounding signs of infection besides swelling, mild Reactive callusing plantar aspect of right foot with swelling to right foot, no erythema, no warmth, no drainage, no signs of infection noted, Capillary fill time <3 seconds in all digits, gross sensation present via light touch to right foot. Pain with palpation diffusely on right foot at the forefoot with most pain with palpation at 2-3 intermetatarsal space and today at 1st MTPJ as last week. No pain with calf compression.   Assessment and Plan:  Problem List Items Addressed This Visit    None  Visit Diagnoses    Wound of right foot    -  Primary   Foot pain, right         -Patient seen and evaluated -Recommend patient to continue with betadine at 1st MTPJ pinpoint wound on right -Continue with PO Augmentin  -Advised patient to limit activity to tolerance until time for surgery on next week to remove hardware and remove metatarsal heads for diffuse foot pain on right  -Return to office after surgery. In the meantime, patient to call office if any issues or problems arise.   Landis Martins, DPM

## 2018-01-11 ENCOUNTER — Encounter: Payer: Self-pay | Admitting: Sports Medicine

## 2018-01-11 DIAGNOSIS — M2011 Hallux valgus (acquired), right foot: Secondary | ICD-10-CM | POA: Diagnosis not present

## 2018-01-11 DIAGNOSIS — M2570 Osteophyte, unspecified joint: Secondary | ICD-10-CM | POA: Diagnosis not present

## 2018-01-12 ENCOUNTER — Telehealth: Payer: Self-pay | Admitting: Sports Medicine

## 2018-01-12 NOTE — Telephone Encounter (Signed)
Post op check phone call made to patient. Patient reports that her eye hurts; states that it feels better today and that she tried eye drops. Patient also states that she is starting to feel her right leg. I encouraged her to take her pain medications to avoid rebound pain. Advised patient to stay off foot and continue with rest, ice, elevation. Patient expressed understanding and I encouraged patient to call office back if she has any problems or concerns. -Dr. Cannon Kettle

## 2018-01-12 NOTE — Progress Notes (Signed)
DOS 01.21.2019 Removal of hardware right foot at 1st metatarsal. Removal of metatarsal heads 2-5 right foot. Possible staple at base of 1st metatarsal after hardware removal.

## 2018-01-12 NOTE — Telephone Encounter (Signed)
Hey this is [employer] [facility] [employer] in [place]. I needed to speak with someone in regards to a prescription written by Dr. Cannon Kettle. It looks like its a post surgical prescription but I needed to clarify a few things on this pain reliever. If you would please call me back as soon as you can. Thank you. [phone].

## 2018-01-12 NOTE — Telephone Encounter (Signed)
Hailey Patterson - [facility] states he was able to speak with the Dr. Cannon Kettle and the medication has been taken care of.

## 2018-01-19 ENCOUNTER — Ambulatory Visit (INDEPENDENT_AMBULATORY_CARE_PROVIDER_SITE_OTHER): Payer: Medicaid Other | Admitting: Sports Medicine

## 2018-01-19 ENCOUNTER — Ambulatory Visit (INDEPENDENT_AMBULATORY_CARE_PROVIDER_SITE_OTHER): Payer: Medicaid Other

## 2018-01-19 ENCOUNTER — Encounter: Payer: Self-pay | Admitting: Sports Medicine

## 2018-01-19 VITALS — BP 114/81 | HR 90 | Temp 97.6°F | Resp 16

## 2018-01-19 DIAGNOSIS — M216X1 Other acquired deformities of right foot: Secondary | ICD-10-CM

## 2018-01-19 DIAGNOSIS — M7741 Metatarsalgia, right foot: Secondary | ICD-10-CM

## 2018-01-19 DIAGNOSIS — T8484XA Pain due to internal orthopedic prosthetic devices, implants and grafts, initial encounter: Secondary | ICD-10-CM

## 2018-01-19 DIAGNOSIS — M79671 Pain in right foot: Secondary | ICD-10-CM

## 2018-01-19 DIAGNOSIS — Z472 Encounter for removal of internal fixation device: Secondary | ICD-10-CM | POA: Diagnosis not present

## 2018-01-19 DIAGNOSIS — M2011 Hallux valgus (acquired), right foot: Secondary | ICD-10-CM

## 2018-01-19 DIAGNOSIS — L905 Scar conditions and fibrosis of skin: Secondary | ICD-10-CM

## 2018-01-19 DIAGNOSIS — Z9889 Other specified postprocedural states: Secondary | ICD-10-CM

## 2018-01-19 MED ORDER — DOXYCYCLINE HYCLATE 100 MG PO TABS: 100.0000 mg | ORAL_TABLET | Freq: Two times a day (BID) | ORAL | 0 refills | Status: DC

## 2018-01-19 NOTE — Progress Notes (Signed)
Subjective: Hailey Patterson is a 62 y.o. female patient seen today in office for POV #1 (DOS 01-11-18), S/P removal of hardware, revision 1st metatarsal osteotomy with staple fixation with 2-5 met head resection. Patient denies admits at surgical site and wasn't able to rest, ice, and elevate because of her husband who was hospitalized. Patient denies calf pain, denies headache, chest pain, shortness of breath, nausea, vomiting, fever, or chills. Patient states that she hasn't taken pain meds because she needed to drive to get to hospital. No other issues noted.   Patient Active Problem List   Diagnosis Date Noted  . Overweight (BMI 25.0-29.9) 08/17/2016  . Carpal tunnel syndrome 07/03/2015  . Abnormal breast finding 04/06/2015  . Itch of skin 04/06/2015  . General patient noncompliance 02/22/2015  . Bunion 09/08/2014  . Bilateral cataracts 09/07/2014  . Breast cancer, right breast ([place]) 09/07/2014  . Chest pain 07/25/2014  . Current tobacco use 10/14/2013  . 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 Tarsal tunnel syndrome 07/15/2013    Current Outpatient Medications on File Prior to Visit  Medication Sig Dispense Refill  . amoxicillin-clavulanate (AUGMENTIN) 875-125 MG tablet Take 1 tablet by mouth 2 (two) times daily. 28 tablet 0  . docusate sodium (COLACE) 100 MG capsule Take 100 mg by mouth 2 (two) times daily as needed for mild constipation.    . docusate sodium (COLACE) 100 MG capsule Take 100 mg by mouth 2 (two) times daily.    . DULoxetine (CYMBALTA) 60 MG capsule Take 1 capsule (60 mg total) by mouth daily. 30 capsule 2  . lidocaine (LIDODERM) 5 % Place 1 patch onto the skin daily. Remove & Discard patch within 12 hours or as directed by MD 30 patch 0  . NONFORMULARY OR COMPOUNDED [place]  Pain Cream Lidocaine 5% Apply 1-2 grams to affected area 3-4 times daily Qty. 120 gm 1 refills    . oxyCODONE-acetaminophen (PERCOCET) 10-325 MG tablet Take 1 tablet by mouth  every 8 (eight) hours as needed for pain.    Marland Kitchen oxyCODONE-acetaminophen (PERCOCET) 10-325 MG tablet Take 1 tablet by mouth every 6 (six) hours as needed for pain (Take 1 tablet by mouth every six hours as needed for pain).    . pregabalin (LYRICA) 75 MG capsule Take 1 capsule (75 mg total) by mouth 2 (two) times daily. 60 capsule 1  . promethazine (PHENERGAN) 25 MG tablet Take 25 mg by mouth every 8 (eight) hours as needed for nausea or vomiting.    . promethazine (PHENERGAN) 25 MG tablet Take 25 mg by mouth every 8 (eight) hours as needed for nausea or vomiting (Take 1 tablet by mouth every eight hours as needed for nausea).    . salicylic acid 17 % gel Apply topically daily. To callus area at bedtime and cover with bandaid 14 g 0   Current Facility-Administered Medications on File Prior to Visit  Medication Dose Route Frequency Provider Last Rate Last Dose  . triamcinolone acetonide (KENALOG) 10 MG/ML injection 10 mg  10 mg Other Once Landis Martins, DPM      . triamcinolone acetonide (KENALOG) 10 MG/ML injection 10 mg  10 mg Other Once Landis Martins, DPM        Allergies  Allergen Reactions  . Macrobid [Nitrofurantoin] Shortness Of Breath and Nausea And Vomiting  . Sulfa Antibiotics Nausea And Vomiting  . Codeine Nausea And Vomiting  . Meperidine Nausea And Vomiting  . Morphine Nausea And Vomiting  Objective: There were no vitals filed for this visit.  General: No acute distress, AAOx3  Right foot: Sutures intact with no gapping or dehiscence at surgical site, mild swelling and peri-wound erythema that is focal to right foot, no warmth, no drainage, + bruisng and swelling, no other signs of infection noted, Capillary fill time <3 seconds in all digits, gross sensation present via light touch to right foot. + pain with  or crepitation with range of motion right/left foot.  No pain with calf compression.   Post Op Xray, Right foot:  Staples and other retained hardware is in good  alignment and position. Osteotomy site healing. Hardware intact. Soft tissue swelling within normal limits for post op status. No other acute findings.   Assessment and Plan:  Problem List Items Addressed This Visit    None    Visit Diagnoses    Painful orthopaedic hardware ([place])    -  Primary   Relevant Orders   DG Foot Complete Right   Prominent metatarsal head of right foot       Metatarsalgia of right foot       Acquired hallux valgus of right foot       Scar tissue       Foot pain, right       S/P foot surgery, right          -Patient seen and evaluated -Xrays reviewed  -Applied dry sterile dressing to surgical site right foot secured with ACE wrap and stockinet  -Advised patient to make sure to keep dressings clean, dry, and intact to right surgical site, removing the ACE as needed  -Advised patient to continue with post-op shoe or CAM boot on right and NWB with knee scooter x 3 more weeks; will consider walking cast office has to order post  -Rx'd Doxycycline for preventative measures  -Advised patient to limit activity to necessity  -Advised patient to ice and elevate 3x per day  -Will plan for possible suture removal at next office visit. In the meantime, patient to call office if any issues or problems arise.   Landis Martins, DPM

## 2018-01-26 ENCOUNTER — Ambulatory Visit (INDEPENDENT_AMBULATORY_CARE_PROVIDER_SITE_OTHER): Payer: Medicaid Other | Admitting: Sports Medicine

## 2018-01-26 ENCOUNTER — Encounter: Payer: Medicaid Other | Admitting: Sports Medicine

## 2018-01-26 ENCOUNTER — Encounter: Payer: Self-pay | Admitting: Sports Medicine

## 2018-01-26 VITALS — BP 142/85 | HR 84 | Resp 16

## 2018-01-26 DIAGNOSIS — M79671 Pain in right foot: Secondary | ICD-10-CM

## 2018-01-26 DIAGNOSIS — L905 Scar conditions and fibrosis of skin: Secondary | ICD-10-CM

## 2018-01-26 DIAGNOSIS — M216X1 Other acquired deformities of right foot: Secondary | ICD-10-CM

## 2018-01-26 DIAGNOSIS — T8484XA Pain due to internal orthopedic prosthetic devices, implants and grafts, initial encounter: Secondary | ICD-10-CM

## 2018-01-26 DIAGNOSIS — Z9889 Other specified postprocedural states: Secondary | ICD-10-CM

## 2018-01-26 DIAGNOSIS — M7741 Metatarsalgia, right foot: Secondary | ICD-10-CM

## 2018-01-26 DIAGNOSIS — M2011 Hallux valgus (acquired), right foot: Secondary | ICD-10-CM

## 2018-01-26 NOTE — Progress Notes (Signed)
Subjective: Hailey Patterson is a 62 y.o. female patient seen today in office for POV #2 (DOS 01-11-18), S/P removal of hardware, revision 1st metatarsal osteotomy with staple fixation with 2-5 met head resection. Patient denies admits at surgical site. Patient denies calf pain, denies headache, chest pain, shortness of breath, nausea, vomiting, fever, or chills. Patient states that she is still helping to take care of husband after hospitalization and he is to have surgery today. No other issues noted.   Patient Active Problem List   Diagnosis Date Noted  . Overweight (BMI 25.0-29.9) 08/17/2016  . Carpal tunnel syndrome 07/03/2015  . Abnormal breast finding 04/06/2015  . Itch of skin 04/06/2015  . General patient noncompliance 02/22/2015  . Bunion 09/08/2014  . Bilateral cataracts 09/07/2014  . Breast cancer, right breast ([place]) 09/07/2014  . Chest pain 07/25/2014  . Current tobacco use 10/14/2013  . 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 Tarsal tunnel syndrome 07/15/2013    Current Outpatient Medications on File Prior to Visit  Medication Sig Dispense Refill  . amoxicillin-clavulanate (AUGMENTIN) 875-125 MG tablet Take 1 tablet by mouth 2 (two) times daily. 28 tablet 0  . docusate sodium (COLACE) 100 MG capsule Take 100 mg by mouth 2 (two) times daily as needed for mild constipation.    . docusate sodium (COLACE) 100 MG capsule Take 100 mg by mouth 2 (two) times daily.    Marland Kitchen doxycycline (VIBRA-TABS) 100 MG tablet Take 1 tablet (100 mg total) by mouth 2 (two) times daily. 20 tablet 0  . DULoxetine (CYMBALTA) 60 MG capsule Take 1 capsule (60 mg total) by mouth daily. 30 capsule 2  . lidocaine (LIDODERM) 5 % Place 1 patch onto the skin daily. Remove & Discard patch within 12 hours or as directed by MD 30 patch 0  . NONFORMULARY OR COMPOUNDED [place]  Pain Cream Lidocaine 5% Apply 1-2 grams to affected area 3-4 times daily Qty. 120 gm 1 refills    .  oxyCODONE-acetaminophen (PERCOCET) 10-325 MG tablet Take 1 tablet by mouth every 8 (eight) hours as needed for pain.    Marland Kitchen oxyCODONE-acetaminophen (PERCOCET) 10-325 MG tablet Take 1 tablet by mouth every 6 (six) hours as needed for pain (Take 1 tablet by mouth every six hours as needed for pain).    . pregabalin (LYRICA) 75 MG capsule Take 1 capsule (75 mg total) by mouth 2 (two) times daily. 60 capsule 1  . promethazine (PHENERGAN) 25 MG tablet Take 25 mg by mouth every 8 (eight) hours as needed for nausea or vomiting.    . promethazine (PHENERGAN) 25 MG tablet Take 25 mg by mouth every 8 (eight) hours as needed for nausea or vomiting (Take 1 tablet by mouth every eight hours as needed for nausea).    . salicylic acid 17 % gel Apply topically daily. To callus area at bedtime and cover with bandaid 14 g 0   Current Facility-Administered Medications on File Prior to Visit  Medication Dose Route Frequency Provider Last Rate Last Dose  . triamcinolone acetonide (KENALOG) 10 MG/ML injection 10 mg  10 mg Other Once Landis Martins, DPM      . triamcinolone acetonide (KENALOG) 10 MG/ML injection 10 mg  10 mg Other Once Landis Martins, DPM        Allergies  Allergen Reactions  . Macrobid [Nitrofurantoin] Shortness Of Breath and Nausea And Vomiting  . Sulfa Antibiotics Nausea And Vomiting  . Codeine Nausea And Vomiting  . Meperidine Nausea  And Vomiting  . Morphine Nausea And Vomiting    Objective: There were no vitals filed for this visit.  General: No acute distress, AAOx3  Right foot: Sutures intact with no gapping or dehiscence at surgical site, mild swelling and peri-wound erythema that is focal to right foot, no warmth, no drainage, + bruisng and swelling, no other signs of infection noted, Capillary fill time <3 seconds in all digits, gross sensation present via light touch to right foot. + pain with  or crepitation with range of motion right/left foot.  No pain with calf compression.    Assessment and Plan:  Problem List Items Addressed This Visit    None    Visit Diagnoses    Painful orthopaedic hardware ([place])    -  Primary   Prominent metatarsal head of right foot       Metatarsalgia of right foot       Acquired hallux valgus of right foot       Scar tissue       Foot pain, right       S/P foot surgery, right          -Patient seen and evaluated -Every other suture was removed  -Applied dry sterile dressing to surgical site right foot secured with ACE wrap and stockinet  -Advised patient to make sure to keep dressings clean, dry, and intact to right surgical site, removing the ACE as needed  -Advised patient to continue with post-op shoe or CAM boot on right and NWB with knee scooter x 2 more weeks -Continue with Doxycycline for preventative measures  -Advised patient to limit activity to necessity  -Advised patient to ice and elevate 3x per day as previously instructed  -Will plan for finishing suture removal at next office visit. In the meantime, patient to call office if any issues or problems arise.   Landis Martins, DPM

## 2018-02-02 ENCOUNTER — Ambulatory Visit (INDEPENDENT_AMBULATORY_CARE_PROVIDER_SITE_OTHER): Payer: Medicaid Other | Admitting: Sports Medicine

## 2018-02-02 ENCOUNTER — Encounter: Payer: Self-pay | Admitting: Sports Medicine

## 2018-02-02 DIAGNOSIS — T8484XA Pain due to internal orthopedic prosthetic devices, implants and grafts, initial encounter: Secondary | ICD-10-CM

## 2018-02-02 DIAGNOSIS — M2011 Hallux valgus (acquired), right foot: Secondary | ICD-10-CM

## 2018-02-02 DIAGNOSIS — M216X1 Other acquired deformities of right foot: Secondary | ICD-10-CM

## 2018-02-02 DIAGNOSIS — M7741 Metatarsalgia, right foot: Secondary | ICD-10-CM

## 2018-02-02 DIAGNOSIS — L905 Scar conditions and fibrosis of skin: Secondary | ICD-10-CM

## 2018-02-02 NOTE — Progress Notes (Signed)
Subjective: Hailey Patterson is a 62 y.o. female patient seen today in office for POV #3 (DOS 01-11-18), S/P removal of hardware, revision 1st metatarsal osteotomy with staple fixation with 2-5 met head resection. Patient denies admits at surgical site as previous and has been noncompliant and hasn't been staying off foot. Patient denies calf pain, denies headache, chest pain, shortness of breath, nausea, vomiting, fever, or chills. Patient states that she is still helping to take care of husband after his surgery still and has been feeding the horses. No other issues noted.   Patient Active Problem List   Diagnosis Date Noted  . Overweight (BMI 25.0-29.9) 08/17/2016  . Carpal tunnel syndrome 07/03/2015  . Abnormal breast finding 04/06/2015  . Itch of skin 04/06/2015  . General patient noncompliance 02/22/2015  . Bunion 09/08/2014  . Bilateral cataracts 09/07/2014  . Breast cancer, right breast ([place]) 09/07/2014  . Chest pain 07/25/2014  . Current tobacco use 10/14/2013  . 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 Tarsal tunnel syndrome 07/15/2013    Current Outpatient Medications on File Prior to Visit  Medication Sig Dispense Refill  . amoxicillin-clavulanate (AUGMENTIN) 875-125 MG tablet Take 1 tablet by mouth 2 (two) times daily. 28 tablet 0  . docusate sodium (COLACE) 100 MG capsule Take 100 mg by mouth 2 (two) times daily as needed for mild constipation.    . docusate sodium (COLACE) 100 MG capsule Take 100 mg by mouth 2 (two) times daily.    Marland Kitchen doxycycline (VIBRA-TABS) 100 MG tablet Take 1 tablet (100 mg total) by mouth 2 (two) times daily. 20 tablet 0  . DULoxetine (CYMBALTA) 60 MG capsule Take 1 capsule (60 mg total) by mouth daily. 30 capsule 2  . lidocaine (LIDODERM) 5 % Place 1 patch onto the skin daily. Remove & Discard patch within 12 hours or as directed by MD 30 patch 0  . NONFORMULARY OR COMPOUNDED [place]  Pain Cream Lidocaine 5% Apply 1-2 grams to affected  area 3-4 times daily Qty. 120 gm 1 refills    . oxyCODONE-acetaminophen (PERCOCET) 10-325 MG tablet Take 1 tablet by mouth every 8 (eight) hours as needed for pain.    Marland Kitchen oxyCODONE-acetaminophen (PERCOCET) 10-325 MG tablet Take 1 tablet by mouth every 6 (six) hours as needed for pain (Take 1 tablet by mouth every six hours as needed for pain).    . pregabalin (LYRICA) 75 MG capsule Take 1 capsule (75 mg total) by mouth 2 (two) times daily. 60 capsule 1  . promethazine (PHENERGAN) 25 MG tablet Take 25 mg by mouth every 8 (eight) hours as needed for nausea or vomiting.    . promethazine (PHENERGAN) 25 MG tablet Take 25 mg by mouth every 8 (eight) hours as needed for nausea or vomiting (Take 1 tablet by mouth every eight hours as needed for nausea).    . salicylic acid 17 % gel Apply topically daily. To callus area at bedtime and cover with bandaid 14 g 0   Current Facility-Administered Medications on File Prior to Visit  Medication Dose Route Frequency Provider Last Rate Last Dose  . triamcinolone acetonide (KENALOG) 10 MG/ML injection 10 mg  10 mg Other Once Landis Martins, DPM      . triamcinolone acetonide (KENALOG) 10 MG/ML injection 10 mg  10 mg Other Once Landis Martins, DPM        Allergies  Allergen Reactions  . Macrobid [Nitrofurantoin] Shortness Of Breath and Nausea And Vomiting  . Sulfa Antibiotics  Nausea And Vomiting  . Codeine Nausea And Vomiting  . Meperidine Nausea And Vomiting  . Morphine Nausea And Vomiting    Objective: There were no vitals filed for this visit.  General: No acute distress, AAOx3  Right foot: Sutures intact with mild gapping or dehiscence at surgical site, mild swelling, no warmth, no drainage, + bruisng and swelling lateral foot, no other signs of infection noted, Capillary fill time <3 seconds in all digits, gross sensation present via light touch to right foot. + mild pain with  or crepitation with range of motion right foot.  No pain with calf  compression.   Assessment and Plan:  Problem List Items Addressed This Visit    None    Visit Diagnoses    Painful orthopaedic hardware ([place])    -  Primary   Prominent metatarsal head of right foot       Metatarsalgia of right foot       Acquired hallux valgus of right foot       Scar tissue          -Patient seen and evaluated -Every other suture were removed  -Applied dry sterile dressing to surgical site right foot secured with ACE wrap and stockinet  -Advised patient to make sure to keep dressings clean, dry, and intact to right surgical site, removing the ACE as needed  -Advised patient to continue with post-op shoe or CAM boot on right and NWB with knee scooter x 1 more weeks then after may partially weightbear -Continue with Doxycycline until completed  -Advised patient to limit activity to necessity  -Advised patient to ice and elevate 3x per day as previously instructed  -Will plan for xray and finishing suture removal and determining if patient can fully bear weight with CAM boot at next office visit. In the meantime, patient to call office if any issues or problems arise.   Landis Martins, DPM

## 2018-02-16 ENCOUNTER — Ambulatory Visit (INDEPENDENT_AMBULATORY_CARE_PROVIDER_SITE_OTHER): Payer: Medicaid Other | Admitting: Sports Medicine

## 2018-02-16 ENCOUNTER — Ambulatory Visit: Payer: Medicaid Other

## 2018-02-16 ENCOUNTER — Encounter: Payer: Self-pay | Admitting: Sports Medicine

## 2018-02-16 DIAGNOSIS — Z9889 Other specified postprocedural states: Secondary | ICD-10-CM

## 2018-02-16 DIAGNOSIS — T8484XA Pain due to internal orthopedic prosthetic devices, implants and grafts, initial encounter: Secondary | ICD-10-CM

## 2018-02-16 DIAGNOSIS — M2011 Hallux valgus (acquired), right foot: Secondary | ICD-10-CM

## 2018-02-16 DIAGNOSIS — M216X1 Other acquired deformities of right foot: Secondary | ICD-10-CM

## 2018-02-16 DIAGNOSIS — L905 Scar conditions and fibrosis of skin: Secondary | ICD-10-CM

## 2018-02-16 DIAGNOSIS — M79671 Pain in right foot: Secondary | ICD-10-CM

## 2018-02-16 DIAGNOSIS — M792 Neuralgia and neuritis, unspecified: Secondary | ICD-10-CM

## 2018-02-16 DIAGNOSIS — M7741 Metatarsalgia, right foot: Secondary | ICD-10-CM

## 2018-02-16 MED ORDER — DULOXETINE HCL 60 MG PO CPEP: 60.0000 mg | ORAL_CAPSULE | Freq: Every day | ORAL | 2 refills | Status: DC

## 2018-02-16 MED ORDER — OXYCODONE-ACETAMINOPHEN 10-325 MG PO TABS: 1.0000 | ORAL_TABLET | Freq: Three times a day (TID) | ORAL | 0 refills | Status: DC | PRN

## 2018-02-16 NOTE — Progress Notes (Signed)
Subjective: Hailey Patterson is a 62 y.o. female patient seen today in office for POV #4 (DOS 01-11-18), S/P removal of hardware, revision 1st metatarsal osteotomy with staple fixation with 2-5 met head resection. Patient admits at surgical site 4/10 throbbing and shooting and pain across ankle with boot or shoe. Patient denies calf pain, denies headache, chest pain, shortness of breath, nausea, vomiting, fever, or chills. Patient states that she is still helping to take care of husband after his surgery and trying to stay off foot. Reports that she started Cymbalta and did have to take pain pill. No other issues noted.   Patient Active Problem List   Diagnosis Date Noted  . Overweight (BMI 25.0-29.9) 08/17/2016  . Carpal tunnel syndrome 07/03/2015  . Abnormal breast finding 04/06/2015  . Itch of skin 04/06/2015  . General patient noncompliance 02/22/2015  . Bunion 09/08/2014  . Bilateral cataracts 09/07/2014  . Breast cancer, right breast ([place]) 09/07/2014  . Chest pain 07/25/2014  . Current tobacco use 10/14/2013  . 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 Tarsal tunnel syndrome 07/15/2013    Current Outpatient Medications on File Prior to Visit  Medication Sig Dispense Refill  . amoxicillin-clavulanate (AUGMENTIN) 875-125 MG tablet Take 1 tablet by mouth 2 (two) times daily. 28 tablet 0  . docusate sodium (COLACE) 100 MG capsule Take 100 mg by mouth 2 (two) times daily as needed for mild constipation.    . docusate sodium (COLACE) 100 MG capsule Take 100 mg by mouth 2 (two) times daily.    Marland Kitchen doxycycline (VIBRA-TABS) 100 MG tablet Take 1 tablet (100 mg total) by mouth 2 (two) times daily. 20 tablet 0  . lidocaine (LIDODERM) 5 % Place 1 patch onto the skin daily. Remove & Discard patch within 12 hours or as directed by MD 30 patch 0  . NONFORMULARY OR COMPOUNDED [place]  Pain Cream Lidocaine 5% Apply 1-2 grams to affected area 3-4 times daily Qty. 120 gm 1 refills    .  oxyCODONE-acetaminophen (PERCOCET) 10-325 MG tablet Take 1 tablet by mouth every 6 (six) hours as needed for pain (Take 1 tablet by mouth every six hours as needed for pain).    . pregabalin (LYRICA) 75 MG capsule Take 1 capsule (75 mg total) by mouth 2 (two) times daily. 60 capsule 1  . promethazine (PHENERGAN) 25 MG tablet Take 25 mg by mouth every 8 (eight) hours as needed for nausea or vomiting.    . promethazine (PHENERGAN) 25 MG tablet Take 25 mg by mouth every 8 (eight) hours as needed for nausea or vomiting (Take 1 tablet by mouth every eight hours as needed for nausea).    . salicylic acid 17 % gel Apply topically daily. To callus area at bedtime and cover with bandaid 14 g 0   Current Facility-Administered Medications on File Prior to Visit  Medication Dose Route Frequency Provider Last Rate Last Dose  . triamcinolone acetonide (KENALOG) 10 MG/ML injection 10 mg  10 mg Other Once Landis Martins, DPM      . triamcinolone acetonide (KENALOG) 10 MG/ML injection 10 mg  10 mg Other Once Landis Martins, DPM        Allergies  Allergen Reactions  . Macrobid [Nitrofurantoin] Shortness Of Breath and Nausea And Vomiting  . Sulfa Antibiotics Nausea And Vomiting  . Codeine Nausea And Vomiting  . Meperidine Nausea And Vomiting  . Morphine Nausea And Vomiting    Objective: There were no vitals filed for  this visit.  General: No acute distress, AAOx3  Right foot: Sutures intact with mild gapping or dehiscence at surgical site of lesser interdigital incisions, mild swelling, no warmth, no drainage, no other signs of infection noted, Capillary fill time <3 seconds in all digits, gross sensation present via light touch to right foot. + mild pain with no crepitation with range of motion right foot.  No pain with calf compression.   Assessment and Plan:  Problem List Items Addressed This Visit    None    Visit Diagnoses    Painful orthopaedic hardware ([place])    -  Primary   Prominent  metatarsal head of right foot       Metatarsalgia of right foot       Scar tissue       Foot pain, right       Acquired hallux valgus of right foot       Neuritis       S/P foot surgery, right          -Patient seen and evaluated -Applied dermabond and kept sutures in place  -Applied steristrips and stockinette to surgical site right foot -Advised patient to continue with post-op shoe or CAM boot on right and NWB with knee scooter  -Advised patient to limit activity to necessity  -Advised patient to ice and elevate 3x per day as previously instructed  -Refilled Cymbalta and Percocet  -Will plan for finishing suture removal and allowing full weightbearing with CAM boot at next office visit. In the meantime, patient to call office if any issues or problems arise.   Landis Martins, DPM

## 2018-02-16 NOTE — Progress Notes (Signed)
  Subjective:  Patient ID: Hailey Patterson, female    DOB: Nov 26, 1956,  MRN: [id number]  Chief Complaint  Patient presents with  . Foot Ulcer     PIN HOLE TOP OF FOOT/YELLOW BROWN COLOR/? INFECTION/NORMALLY SEE'S DR STOVER   62 y.o. female returns for the above complaint.  States there is a pain on the top of her foot concern for infection.  Objective:  There were no vitals filed for this visit. General AA&O x3. Normal mood and affect.  Vascular Pedal pulses palpable.  Neurologic Epicritic sensation grossly intact.  Dermatologic Small pinhole with fibrotic base no purulence.  Orthopedic:  Pain to palpation about the right foot surgical site   Assessment & Plan:  Patient was evaluated and treated and all questions answered.  Status post right foot removal of scar tissue hammertoe repair, screw removal -Discussed the patient that I do not believe there is signs of infection. -Follow-up with Dr. Cannon Kettle  No Follow-up on file.

## 2018-03-02 ENCOUNTER — Ambulatory Visit: Payer: Medicaid Other

## 2018-03-02 DIAGNOSIS — M79671 Pain in right foot: Secondary | ICD-10-CM

## 2018-03-02 DIAGNOSIS — M216X1 Other acquired deformities of right foot: Secondary | ICD-10-CM

## 2018-03-03 NOTE — Progress Notes (Signed)
Patient presents s/p DOS 01.21.2019 Removal of hardware right foot at 1st metatarsal. Removal of metatarsal heads 2-5 right foot. Possible staple at base of 1st metatarsal after hardware removal.  Noted well healing surgical foot, left sutures intact and dermabond was still present on suture lines. Applied steri-strips to areas and redressed. She is to follow up in 1 week to see Dr Cannon Kettle

## 2018-03-09 ENCOUNTER — Encounter: Payer: Self-pay | Admitting: Sports Medicine

## 2018-03-09 ENCOUNTER — Ambulatory Visit (INDEPENDENT_AMBULATORY_CARE_PROVIDER_SITE_OTHER): Payer: Medicaid Other | Admitting: Sports Medicine

## 2018-03-09 VITALS — BP 137/93 | HR 81 | Resp 16

## 2018-03-09 DIAGNOSIS — M2011 Hallux valgus (acquired), right foot: Secondary | ICD-10-CM

## 2018-03-09 DIAGNOSIS — M216X1 Other acquired deformities of right foot: Secondary | ICD-10-CM

## 2018-03-09 DIAGNOSIS — M792 Neuralgia and neuritis, unspecified: Secondary | ICD-10-CM

## 2018-03-09 DIAGNOSIS — M7741 Metatarsalgia, right foot: Secondary | ICD-10-CM

## 2018-03-09 DIAGNOSIS — M79671 Pain in right foot: Secondary | ICD-10-CM

## 2018-03-09 DIAGNOSIS — L905 Scar conditions and fibrosis of skin: Secondary | ICD-10-CM

## 2018-03-09 DIAGNOSIS — Z9889 Other specified postprocedural states: Secondary | ICD-10-CM

## 2018-03-09 DIAGNOSIS — T8484XA Pain due to internal orthopedic prosthetic devices, implants and grafts, initial encounter: Secondary | ICD-10-CM

## 2018-03-09 NOTE — Progress Notes (Signed)
Subjective: Hailey Patterson is a 62 y.o. female patient seen today in office for POV #6 (DOS 01-11-18), S/P removal of hardware, revision 1st metatarsal osteotomy with staple fixation with 2-5 met head resection. Patient admits at surgical site 4/10 throbbing and shooting and pain across ankle and at side and ball of foot. Patient denies calf pain, denies headache, chest pain, shortness of breath, nausea, vomiting, fever, or chills. Admits constipation with Cymbalta. No other issues noted.   Patient Active Problem List   Diagnosis Date Noted  . Overweight (BMI 25.0-29.9) 08/17/2016  . Carpal tunnel syndrome 07/03/2015  . Abnormal breast finding 04/06/2015  . Itch of skin 04/06/2015  . General patient noncompliance 02/22/2015  . Bunion 09/08/2014  . Bilateral cataracts 09/07/2014  . Breast cancer, right breast ([place]) 09/07/2014  . Chest pain 07/25/2014  . Current tobacco use 10/14/2013  . 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 Tarsal tunnel syndrome 07/15/2013    Current Outpatient Medications on File Prior to Visit  Medication Sig Dispense Refill  . amoxicillin-clavulanate (AUGMENTIN) 875-125 MG tablet Take 1 tablet by mouth 2 (two) times daily. 28 tablet 0  . docusate sodium (COLACE) 100 MG capsule Take 100 mg by mouth 2 (two) times daily as needed for mild constipation.    . docusate sodium (COLACE) 100 MG capsule Take 100 mg by mouth 2 (two) times daily.    Marland Kitchen doxycycline (VIBRA-TABS) 100 MG tablet Take 1 tablet (100 mg total) by mouth 2 (two) times daily. 20 tablet 0  . DULoxetine (CYMBALTA) 60 MG capsule Take 1 capsule (60 mg total) by mouth daily. 30 capsule 2  . lidocaine (LIDODERM) 5 % Place 1 patch onto the skin daily. Remove & Discard patch within 12 hours or as directed by MD 30 patch 0  . NONFORMULARY OR COMPOUNDED [place]  Pain Cream Lidocaine 5% Apply 1-2 grams to affected area 3-4 times daily Qty. 120 gm 1 refills    . oxyCODONE-acetaminophen  (PERCOCET) 10-325 MG tablet Take 1 tablet by mouth every 6 (six) hours as needed for pain (Take 1 tablet by mouth every six hours as needed for pain).    Marland Kitchen oxyCODONE-acetaminophen (PERCOCET) 10-325 MG tablet Take 1 tablet by mouth every 8 (eight) hours as needed for pain. 30 tablet 0  . pregabalin (LYRICA) 75 MG capsule Take 1 capsule (75 mg total) by mouth 2 (two) times daily. 60 capsule 1  . promethazine (PHENERGAN) 25 MG tablet Take 25 mg by mouth every 8 (eight) hours as needed for nausea or vomiting.    . promethazine (PHENERGAN) 25 MG tablet Take 25 mg by mouth every 8 (eight) hours as needed for nausea or vomiting (Take 1 tablet by mouth every eight hours as needed for nausea).    . salicylic acid 17 % gel Apply topically daily. To callus area at bedtime and cover with bandaid 14 g 0   Current Facility-Administered Medications on File Prior to Visit  Medication Dose Route Frequency Provider Last Rate Last Dose  . triamcinolone acetonide (KENALOG) 10 MG/ML injection 10 mg  10 mg Other Once Landis Martins, DPM      . triamcinolone acetonide (KENALOG) 10 MG/ML injection 10 mg  10 mg Other Once Landis Martins, DPM        Allergies  Allergen Reactions  . Macrobid [Nitrofurantoin] Shortness Of Breath and Nausea And Vomiting  . Sulfa Antibiotics Nausea And Vomiting  . Codeine Nausea And Vomiting  . Meperidine Nausea And Vomiting  .  Morphine Nausea And Vomiting    Objective: There were no vitals filed for this visit.  General: No acute distress, AAOx3  Right foot: Sutures intact with no gapping or dehiscence at surgical site of lesser interdigital incisions, mild swelling, no warmth, no drainage, no other signs of infection noted, Capillary fill time <3 seconds in all digits, gross sensation present via light touch to right foot. + mild pain with no crepitation with range of motion right foot at with palpation diffusely.  No pain with calf compression.   Assessment and Plan:  Problem  List Items Addressed This Visit    None    Visit Diagnoses    Foot pain, right    -  Primary   Prominent metatarsal head of right foot       Painful orthopaedic hardware ([place])       Metatarsalgia of right foot       Scar tissue       Acquired hallux valgus of right foot       Neuritis       S/P foot surgery, right          -Patient seen and evaluated -Sutures removed, stockinette applied -Patient to continue with ACE wrap or compression sleeve for edema control  -Advised patient to continue with post-op shoe or CAM boot on right with weightbearing to tolerance and knee scooter PRN -Advised patient to limit activity to necessity  -Advised patient to ice and elevate as previously instructed  -Continue with Cymbalta and Percocet; advised pear juice or stool softener for constipation  -Will plan for xray and transition to a shoe at next office visit. In the meantime, patient to call office if any issues or problems arise.   Landis Martins, DPM

## 2018-03-23 ENCOUNTER — Ambulatory Visit (INDEPENDENT_AMBULATORY_CARE_PROVIDER_SITE_OTHER): Payer: Medicaid Other | Admitting: Sports Medicine

## 2018-03-23 ENCOUNTER — Telehealth: Payer: Self-pay | Admitting: *Deleted

## 2018-03-23 ENCOUNTER — Encounter: Payer: Self-pay | Admitting: Sports Medicine

## 2018-03-23 ENCOUNTER — Ambulatory Visit (INDEPENDENT_AMBULATORY_CARE_PROVIDER_SITE_OTHER): Payer: Medicaid Other

## 2018-03-23 DIAGNOSIS — M2011 Hallux valgus (acquired), right foot: Secondary | ICD-10-CM

## 2018-03-23 DIAGNOSIS — M79671 Pain in right foot: Secondary | ICD-10-CM

## 2018-03-23 DIAGNOSIS — M216X1 Other acquired deformities of right foot: Secondary | ICD-10-CM | POA: Diagnosis not present

## 2018-03-23 DIAGNOSIS — M7741 Metatarsalgia, right foot: Secondary | ICD-10-CM

## 2018-03-23 DIAGNOSIS — Z9889 Other specified postprocedural states: Secondary | ICD-10-CM

## 2018-03-23 DIAGNOSIS — M792 Neuralgia and neuritis, unspecified: Secondary | ICD-10-CM

## 2018-03-23 DIAGNOSIS — T8484XA Pain due to internal orthopedic prosthetic devices, implants and grafts, initial encounter: Secondary | ICD-10-CM

## 2018-03-23 NOTE — Telephone Encounter (Signed)
-----   Message from Landis Martins, [place] sent at 03/23/2018 11:29 AM EDT ----- Regarding: Compression sleeve Knee high compression sleeve Post surgical swelling right foot

## 2018-03-23 NOTE — Progress Notes (Signed)
Subjective: Hailey Patterson is a 62 y.o. female patient seen today in office for POV # (DOS 01-11-18), S/P removal of hardware, revision 1st metatarsal osteotomy with staple fixation with 2-5 met head resection. Patient admits swelling and pain at surgical site and along the sides of her foot. Patient denies calf pain, denies headache, chest pain, shortness of breath, nausea, vomiting, fever, or chills. No other issues noted.   Patient Active Problem List   Diagnosis Date Noted  . Overweight (BMI 25.0-29.9) 08/17/2016  . Carpal tunnel syndrome 07/03/2015  . Abnormal breast finding 04/06/2015  . Itch of skin 04/06/2015  . General patient noncompliance 02/22/2015  . Bunion 09/08/2014  . Bilateral cataracts 09/07/2014  . Breast cancer, right breast ([place]) 09/07/2014  . Chest pain 07/25/2014  . Current tobacco use 10/14/2013  . 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 Tarsal tunnel syndrome 07/15/2013    Current Outpatient Medications on File Prior to Visit  Medication Sig Dispense Refill  . amoxicillin-clavulanate (AUGMENTIN) 875-125 MG tablet Take 1 tablet by mouth 2 (two) times daily. (Patient not taking: Reported on 03/23/2018) 28 tablet 0  . docusate sodium (COLACE) 100 MG capsule Take 100 mg by mouth 2 (two) times daily as needed for mild constipation.    . docusate sodium (COLACE) 100 MG capsule Take 100 mg by mouth 2 (two) times daily.    Marland Kitchen doxycycline (VIBRA-TABS) 100 MG tablet Take 1 tablet (100 mg total) by mouth 2 (two) times daily. (Patient not taking: Reported on 03/23/2018) 20 tablet 0  . DULoxetine (CYMBALTA) 60 MG capsule Take 1 capsule (60 mg total) by mouth daily. (Patient not taking: Reported on 03/23/2018) 30 capsule 2  . lidocaine (LIDODERM) 5 % Place 1 patch onto the skin daily. Remove & Discard patch within 12 hours or as directed by MD (Patient not taking: Reported on 03/23/2018) 30 patch 0  . NONFORMULARY OR COMPOUNDED [place]  Pain Cream Lidocaine  5% Apply 1-2 grams to affected area 3-4 times daily Qty. 120 gm 1 refills    . oxyCODONE-acetaminophen (PERCOCET) 10-325 MG tablet Take 1 tablet by mouth every 6 (six) hours as needed for pain (Take 1 tablet by mouth every six hours as needed for pain).    Marland Kitchen oxyCODONE-acetaminophen (PERCOCET) 10-325 MG tablet Take 1 tablet by mouth every 8 (eight) hours as needed for pain. (Patient not taking: Reported on 03/23/2018) 30 tablet 0  . pregabalin (LYRICA) 75 MG capsule Take 1 capsule (75 mg total) by mouth 2 (two) times daily. (Patient not taking: Reported on 03/23/2018) 60 capsule 1  . promethazine (PHENERGAN) 25 MG tablet Take 25 mg by mouth every 8 (eight) hours as needed for nausea or vomiting.    . promethazine (PHENERGAN) 25 MG tablet Take 25 mg by mouth every 8 (eight) hours as needed for nausea or vomiting (Take 1 tablet by mouth every eight hours as needed for nausea).    . salicylic acid 17 % gel Apply topically daily. To callus area at bedtime and cover with bandaid 14 g 0   Current Facility-Administered Medications on File Prior to Visit  Medication Dose Route Frequency Provider Last Rate Last Dose  . triamcinolone acetonide (KENALOG) 10 MG/ML injection 10 mg  10 mg Other Once Landis Martins, DPM      . triamcinolone acetonide (KENALOG) 10 MG/ML injection 10 mg  10 mg Other Once Landis Martins, DPM        Allergies  Allergen Reactions  . Macrobid [  Nitrofurantoin] Shortness Of Breath and Nausea And Vomiting  . Sulfa Antibiotics Nausea And Vomiting  . Codeine Nausea And Vomiting  . Meperidine Nausea And Vomiting  . Morphine Nausea And Vomiting    Objective: There were no vitals filed for this visit.  General: No acute distress, AAOx3  Right foot: Incisions healed at surgical sites, mild swelling, no warmth, no drainage, no other signs of infection noted, Capillary fill time <3 seconds in all digits, gross sensation present via light touch to right foot. + mild pain with no  crepitation with range of motion right foot at with palpation diffusely.  No pain with calf compression.   Xrays, hardware intact there appears to be slight shift 1st metatarsal but with consolidation and hardware intact.   Assessment and Plan:  Problem List Items Addressed This Visit    None    Visit Diagnoses    Prominent metatarsal head of right foot    -  Primary   Relevant Orders   DG Foot Complete Right (Completed)   Foot pain, right       Painful orthopaedic hardware ([place])       Metatarsalgia of right foot       Acquired hallux valgus of right foot       Neuritis       S/P foot surgery, right          -Patient seen and evaluated -Xrays reviewed  -Rx Knee high compression stockings  -Advised patient to continue with post-op shoe or CAM boot and slowly wean to tennis shoe at next visit  -Advised patient to limit activity to necessity  -Advised patient to ice and elevate as previously instructed  -Continue with PRN meds   -Will plan for having orthotics re-made/assessed at next office visit. In the meantime, patient to call office if any issues or problems arise.   Landis Martins, DPM

## 2018-03-24 ENCOUNTER — Ambulatory Visit: Payer: Medicaid Other

## 2018-03-24 NOTE — Telephone Encounter (Signed)
Completed initial [employer] Cares:  Compression with a Cause form for S/P swelling and to decrease the swelling for comfort and decrease possibility of skin breakdown, Note on appt schedule to get measurements and pt's signature for possible personal product endorsement.

## 2018-03-24 NOTE — Telephone Encounter (Deleted)
-----   Message from Landis Martins, [place] sent at 03/23/2018 11:29 AM EDT ----- Regarding: Compression sleeve Knee high compression sleeve Post surgical swelling right foot

## 2018-03-30 NOTE — Telephone Encounter (Signed)
Pt called for status on the Compression hose.

## 2018-03-30 NOTE — Telephone Encounter (Addendum)
Left message informing pt the compression hose order would be sent to [facility] [phone], they would not be covered by insurance. Faxed orders to [facility]. Mailed [facility] slip to pt's address.

## 2018-04-20 ENCOUNTER — Ambulatory Visit: Payer: Medicaid Other | Admitting: Podiatry

## 2018-04-20 ENCOUNTER — Ambulatory Visit: Payer: Medicaid Other | Admitting: Sports Medicine

## 2018-04-20 ENCOUNTER — Encounter: Payer: Self-pay | Admitting: Podiatry

## 2018-04-20 ENCOUNTER — Ambulatory Visit (INDEPENDENT_AMBULATORY_CARE_PROVIDER_SITE_OTHER): Payer: Medicaid Other

## 2018-04-20 DIAGNOSIS — M779 Enthesopathy, unspecified: Secondary | ICD-10-CM

## 2018-04-20 DIAGNOSIS — M778 Other enthesopathies, not elsewhere classified: Secondary | ICD-10-CM

## 2018-04-20 DIAGNOSIS — M216X1 Other acquired deformities of right foot: Secondary | ICD-10-CM

## 2018-04-20 DIAGNOSIS — M7751 Other enthesopathy of right foot: Secondary | ICD-10-CM

## 2018-04-20 MED ORDER — MELOXICAM 15 MG PO TABS: 15.0000 mg | ORAL_TABLET | Freq: Every day | ORAL | 3 refills | Status: DC

## 2018-04-20 MED ORDER — OXYCODONE-ACETAMINOPHEN 10-325 MG PO TABS: 1.0000 | ORAL_TABLET | Freq: Three times a day (TID) | ORAL | 0 refills | Status: DC | PRN

## 2018-04-22 NOTE — Progress Notes (Addendum)
   HPI: 62 year old female presenting today with a new and unassociated chief complaint of pain to the dorsum of the right foot that began four days ago. She reports associated swelling of the area. Walking increases the pain. She has not done anything for treatment. Patient is here for further evaluation and treatment. Patient has had revisional surgery to the right forefoot and is currently under the care of Dr. Cannon Kettle.  Last Surgery was on 01/11/2018.  At that time the patient underwent metatarsal head resections 2-5 of the right foot with revision of the first metatarsal right.   Past Medical History:  Diagnosis Date  . Anxiety   . Breast cancer [facility])      Physical Exam: General: The patient is alert and oriented x3 in no acute distress.  Dermatology: Skin is warm, dry and supple bilateral lower extremities. Negative for open lesions or macerations.  Vascular: Palpable pedal pulses bilaterally. No edema or erythema noted. Capillary refill within normal limits.  Neurological: Epicritic and protective threshold grossly intact bilaterally.   Musculoskeletal Exam: Pain with palpation of the dorsal right midfoot. Range of motion within normal limits to all pedal and ankle joints bilateral. Muscle strength 5/5 in all groups bilateral.   Radiographic Exam:  Metatarsal head resections 2-5 of the right foot noted.  No cortical disruption or osteolytic process to the metatarsals 2-5 right foot.  There does appear to be some mild degenerative changes noted throughout the right midfoot.  Orthopedic hardware throughout the right foot appears to be somewhat intact.  It does appear to be some possible nonunion to the first metatarsal with intact compression staples.  Assessment: 1. Right midfoot capsulitis  2.  History of right forefoot reconstructive surgery with metatarsal head resections and revision first metatarsal surgery.  Date of surgery 01/11/2018.  Plan of Care:  1. Patient evaluated.  X-Rays reviewed.  2. CAM boot dispensed. Weightbearing as tolerated.  3. Ace wrap applied.  4. Prescription for Meloxicam provided to patient.  5. Return to clinic for follow up evaluation with Dr. Cannon Kettle.       Edrick Kins, DPM [facility]  Dr. Edrick Kins, DPM    [address], [place] [address]                Office [phone]  Fax [phone]

## 2018-05-04 ENCOUNTER — Ambulatory Visit: Payer: Medicaid Other | Admitting: Sports Medicine

## 2018-05-04 ENCOUNTER — Encounter: Payer: Self-pay | Admitting: Sports Medicine

## 2018-05-04 DIAGNOSIS — M7751 Other enthesopathy of right foot: Secondary | ICD-10-CM

## 2018-05-04 DIAGNOSIS — M792 Neuralgia and neuritis, unspecified: Secondary | ICD-10-CM

## 2018-05-04 DIAGNOSIS — M778 Other enthesopathies, not elsewhere classified: Secondary | ICD-10-CM

## 2018-05-04 DIAGNOSIS — T8484XA Pain due to internal orthopedic prosthetic devices, implants and grafts, initial encounter: Secondary | ICD-10-CM

## 2018-05-04 DIAGNOSIS — M79671 Pain in right foot: Secondary | ICD-10-CM

## 2018-05-04 DIAGNOSIS — M779 Enthesopathy, unspecified: Principal | ICD-10-CM

## 2018-05-04 NOTE — Progress Notes (Addendum)
Subjective: Hailey Patterson is a 62 y.o. female patient seen today in office for POV #9 (DOS 01-11-18), S/P removal of hardware, revision 1st metatarsal osteotomy with staple fixation with 2-5 met head resection. Patient admits swelling is better after mobic but continued pain at dorsal midfoot that is the worse. Reports her compression sleeve helps and that she has been in her CAM boot but still has pain. Patient denies calf pain, denies headache, chest pain, shortness of breath, nausea, vomiting, fever, or chills. No other issues noted.   Patient Active Problem List   Diagnosis Date Noted  . Overweight (BMI 25.0-29.9) 08/17/2016  . Carpal tunnel syndrome 07/03/2015  . Abnormal breast finding 04/06/2015  . Itch of skin 04/06/2015  . General patient noncompliance 02/22/2015  . Bunion 09/08/2014  . Bilateral cataracts 09/07/2014  . Breast cancer, right breast ([place]) 09/07/2014  . Chest pain 07/25/2014  . Current tobacco use 10/14/2013  . 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 Tarsal tunnel syndrome 07/15/2013    Current Outpatient Medications on File Prior to Visit  Medication Sig Dispense Refill  . amoxicillin-clavulanate (AUGMENTIN) 875-125 MG tablet Take 1 tablet by mouth 2 (two) times daily. 28 tablet 0  . docusate sodium (COLACE) 100 MG capsule Take 100 mg by mouth 2 (two) times daily as needed for mild constipation.    . docusate sodium (COLACE) 100 MG capsule Take 100 mg by mouth 2 (two) times daily.    Marland Kitchen doxycycline (VIBRA-TABS) 100 MG tablet Take 1 tablet (100 mg total) by mouth 2 (two) times daily. 20 tablet 0  . DULoxetine (CYMBALTA) 60 MG capsule Take 1 capsule (60 mg total) by mouth daily. 30 capsule 2  . lidocaine (LIDODERM) 5 % Place 1 patch onto the skin daily. Remove & Discard patch within 12 hours or as directed by MD 30 patch 0  . meloxicam (MOBIC) 15 MG tablet Take 1 tablet (15 mg total) by mouth daily. 30 tablet 3  . NONFORMULARY OR COMPOUNDED ITEM [facility]  Pain Cream Lidocaine 5% Apply 1-2 grams to affected area 3-4 times daily Qty. 120 gm 1 refills    . oxyCODONE-acetaminophen (PERCOCET) 10-325 MG tablet Take 1 tablet by mouth every 6 (six) hours as needed for pain (Take 1 tablet by mouth every six hours as needed for pain).    Marland Kitchen oxyCODONE-acetaminophen (PERCOCET) 10-325 MG tablet Take 1 tablet by mouth every 8 (eight) hours as needed for pain. 30 tablet 0  . pregabalin (LYRICA) 75 MG capsule Take 1 capsule (75 mg total) by mouth 2 (two) times daily. 60 capsule 1  . promethazine (PHENERGAN) 25 MG tablet Take 25 mg by mouth every 8 (eight) hours as needed for nausea or vomiting.    . promethazine (PHENERGAN) 25 MG tablet Take 25 mg by mouth every 8 (eight) hours as needed for nausea or vomiting (Take 1 tablet by mouth every eight hours as needed for nausea).    . salicylic acid 17 % gel Apply topically daily. To callus area at bedtime and cover with bandaid 14 g 0   Current Facility-Administered Medications on File Prior to Visit  Medication Dose Route Frequency Provider Last Rate Last Dose  . triamcinolone acetonide (KENALOG) 10 MG/ML injection 10 mg  10 mg Other Once Landis Martins, DPM      . triamcinolone acetonide (KENALOG) 10 MG/ML injection 10 mg  10 mg Other Once Landis Martins, DPM        Allergies  Allergen Reactions  .  Macrobid [Nitrofurantoin] Shortness Of Breath and Nausea And Vomiting  . Sulfa Antibiotics Nausea And Vomiting  . Codeine Nausea And Vomiting  . Meperidine Nausea And Vomiting  . Morphine Nausea And Vomiting    Objective: There were no vitals filed for this visit.  General: No acute distress, AAOx3  Right foot: Incisions healed at surgical sites, minimal swelling, no warmth, no drainage, no other signs of infection noted, Capillary fill time <3 seconds in all digits, gross sensation present via light touch to right foot. + mild pain with no crepitation with range of motion right foot at 1st ray.  No  pain with calf compression.   Assessment and Plan:  Problem List Items Addressed This Visit    None    Visit Diagnoses    Capsulitis of right foot    -  Primary   Painful orthopaedic hardware ([place])       Neuritis       Foot pain, right          -Patient seen and evaluated -Continue with CAM boot -Continue with Mobic  -Continue with Knee high compression stockings  -Advised patient to consider 1st ray/medial column fusion on right as last attempt to help reduce pain however no ganurantee especially if patient is going to continue with noncompliance like she has in past with NWB status after surgery; explained to patient that she should expect to be NWB 2 months and will be casted/splinted after surgery -Advised patient to limit activity to necessity while in boot -Advised patient to ice and elevate as previously instructed  -Return 2 weeks for surgery consult. In the meantime, patient to call office if any issues or problems arise.   Landis Martins, DPM

## 2018-05-18 ENCOUNTER — Ambulatory Visit (INDEPENDENT_AMBULATORY_CARE_PROVIDER_SITE_OTHER): Payer: Medicaid Other | Admitting: Sports Medicine

## 2018-05-18 ENCOUNTER — Ambulatory Visit (INDEPENDENT_AMBULATORY_CARE_PROVIDER_SITE_OTHER): Payer: Medicaid Other

## 2018-05-18 ENCOUNTER — Encounter: Payer: Self-pay | Admitting: Sports Medicine

## 2018-05-18 ENCOUNTER — Telehealth: Payer: Self-pay | Admitting: *Deleted

## 2018-05-18 DIAGNOSIS — M21271 Flexion deformity, right ankle and toes: Secondary | ICD-10-CM

## 2018-05-18 DIAGNOSIS — T8484XA Pain due to internal orthopedic prosthetic devices, implants and grafts, initial encounter: Secondary | ICD-10-CM

## 2018-05-18 DIAGNOSIS — Z9889 Other specified postprocedural states: Secondary | ICD-10-CM

## 2018-05-18 DIAGNOSIS — M2011 Hallux valgus (acquired), right foot: Secondary | ICD-10-CM | POA: Diagnosis not present

## 2018-05-18 DIAGNOSIS — S92311A Displaced fracture of first metatarsal bone, right foot, initial encounter for closed fracture: Secondary | ICD-10-CM | POA: Diagnosis not present

## 2018-05-18 DIAGNOSIS — M79671 Pain in right foot: Secondary | ICD-10-CM

## 2018-05-18 DIAGNOSIS — M792 Neuralgia and neuritis, unspecified: Secondary | ICD-10-CM

## 2018-05-18 NOTE — Telephone Encounter (Signed)
-----   Message from [place], [place] sent at 05/18/2018  2:59 PM EDT ----- Regarding: [facility] bone stimulator  Patient has fracture at staples on 1st metatarsal right foot and will be undergoing surgery to remove staples and fuse the bone. Patient will need bone stim that can work with cast that I will place at time of surgery -Dr. Chauncey Cruel

## 2018-05-18 NOTE — Progress Notes (Signed)
Subjective: Hailey Patterson is a 62 y.o. female patient seen today in office for POV #10  (DOS 01-11-18), S/P removal of hardware, revision 1st metatarsal osteotomy with staple fixation with 2-5 met head resection. Patient admits to continued pain across the foot with swelling and worsening pain. Using CAM boot with no complete relief. Patient wants to further discuss what can be done. No other issues noted.   Patient Active Problem List   Diagnosis Date Noted  . Overweight (BMI 25.0-29.9) 08/17/2016  . Carpal tunnel syndrome 07/03/2015  . Abnormal breast finding 04/06/2015  . Itch of skin 04/06/2015  . General patient noncompliance 02/22/2015  . Bunion 09/08/2014  . Bilateral cataracts 09/07/2014  . Breast cancer, right breast ([place]) 09/07/2014  . Chest pain 07/25/2014  . Current tobacco use 10/14/2013  . 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 Tarsal tunnel syndrome 07/15/2013    Current Outpatient Medications on File Prior to Visit  Medication Sig Dispense Refill  . amoxicillin-clavulanate (AUGMENTIN) 875-125 MG tablet Take 1 tablet by mouth 2 (two) times daily. 28 tablet 0  . docusate sodium (COLACE) 100 MG capsule Take 100 mg by mouth 2 (two) times daily as needed for mild constipation.    . docusate sodium (COLACE) 100 MG capsule Take 100 mg by mouth 2 (two) times daily.    Marland Kitchen doxycycline (VIBRA-TABS) 100 MG tablet Take 1 tablet (100 mg total) by mouth 2 (two) times daily. 20 tablet 0  . DULoxetine (CYMBALTA) 60 MG capsule Take 1 capsule (60 mg total) by mouth daily. 30 capsule 2  . lidocaine (LIDODERM) 5 % Place 1 patch onto the skin daily. Remove & Discard patch within 12 hours or as directed by MD 30 patch 0  . meloxicam (MOBIC) 15 MG tablet Take 1 tablet (15 mg total) by mouth daily. 30 tablet 3  . NONFORMULARY OR COMPOUNDED ITEM [facility]  Pain Cream Lidocaine 5% Apply 1-2 grams to affected area 3-4 times daily Qty. 120 gm 1 refills    . oxyCODONE-acetaminophen  (PERCOCET) 10-325 MG tablet Take 1 tablet by mouth every 6 (six) hours as needed for pain (Take 1 tablet by mouth every six hours as needed for pain).    Marland Kitchen oxyCODONE-acetaminophen (PERCOCET) 10-325 MG tablet Take 1 tablet by mouth every 8 (eight) hours as needed for pain. 30 tablet 0  . pregabalin (LYRICA) 75 MG capsule Take 1 capsule (75 mg total) by mouth 2 (two) times daily. 60 capsule 1  . promethazine (PHENERGAN) 25 MG tablet Take 25 mg by mouth every 8 (eight) hours as needed for nausea or vomiting.    . promethazine (PHENERGAN) 25 MG tablet Take 25 mg by mouth every 8 (eight) hours as needed for nausea or vomiting (Take 1 tablet by mouth every eight hours as needed for nausea).    . salicylic acid 17 % gel Apply topically daily. To callus area at bedtime and cover with bandaid 14 g 0   Current Facility-Administered Medications on File Prior to Visit  Medication Dose Route Frequency Provider Last Rate Last Dose  . triamcinolone acetonide (KENALOG) 10 MG/ML injection 10 mg  10 mg Other Once Landis Martins, DPM      . triamcinolone acetonide (KENALOG) 10 MG/ML injection 10 mg  10 mg Other Once Landis Martins, DPM        Allergies  Allergen Reactions  . Macrobid [Nitrofurantoin] Shortness Of Breath and Nausea And Vomiting  . Sulfa Antibiotics Nausea And Vomiting  . Codeine  Nausea And Vomiting  . Meperidine Nausea And Vomiting  . Morphine Nausea And Vomiting    Objective: There were no vitals filed for this visit.  General: No acute distress, AAOx3  Right foot: Incisions healed at surgical sites, mild swelling dorsal midfoot, no warmth, no drainage, no other signs of infection noted, Capillary fill time <3 seconds in all digits, gross sensation present via light touch to right foot. + mild pain with no crepitation with range of motion right foot at 1st ray.  No pain with calf compression.   Xray Right foot, hardware intact however there is poor position of 1st met shaft and residual  valgus deformity and interphalangus there is a new crack/fracture at plantar staple within the bone with 1 mm gap and soft tissue swelling.   Assessment and Plan:  Problem List Items Addressed This Visit    None    Visit Diagnoses    Painful orthopaedic hardware ([place])    -  Primary   Relevant Orders   DG Foot Complete Right   Acquired hallux valgus of right foot       Hallux interphalangeus, acquired, right       Metatarsus primus elevatus, right       Neuritis       Foot pain, right       S/P foot surgery, right          -Patient seen and evaluated -Patient opt for surgical management. Consent obtained for Right 1st toe, mpj, tmtj fusion. Pre and Post op course explained. Risks, benefits, alternatives explained. No guarantees given or implied. Surgical booking slip submitted and provided patient with Surgical packet and info for [place].  -Patient to be NWB 2 months and will be casted/splinted after surgery -Rx [facility] bone stim to use post op  -Advised patient to ice and elevate as previously instructed and meanwhile continue with CAM boot until time for surgery -Continue with Mobic until time for surgery   -Return after surgery. In the meantime, patient to call office if any issues or problems arise.   Landis Martins, DPM

## 2018-05-18 NOTE — Patient Instructions (Signed)
Pre-Operative Instructions  Congratulations, you have decided to take an important step towards improving your quality of life.  You can be assured that the doctors and staff at [facility] will be with you every step of the way.  Here are some important things you should know:  1. Plan to be at the surgery center/hospital at least 1 (one) hour prior to your scheduled time, unless otherwise directed by the surgical center/hospital staff.  You must have a responsible adult accompany you, remain during the surgery and drive you home.  Make sure you have directions to the surgical center/hospital to ensure you arrive on time. 2. If you are having surgery at [facility] or [facility], you will need a copy of your medical history and physical form from your family physician within one month prior to the date of surgery. We will give you a form for your primary physician to complete.  3. We make every effort to accommodate the date you request for surgery.  However, there are times where surgery dates or times have to be moved.  We will contact you as soon as possible if a change in schedule is required.   4. No aspirin/ibuprofen for one week before surgery.  If you are on aspirin, any non-steroidal anti-inflammatory medications (Mobic, Aleve, Ibuprofen) should not be taken seven (7) days prior to your surgery.  You make take Tylenol for pain prior to surgery.  5. Medications - If you are taking daily heart and blood pressure medications, seizure, reflux, allergy, asthma, anxiety, pain or diabetes medications, make sure you notify the surgery center/hospital before the day of surgery so they can tell you which medications you should take or avoid the day of surgery. 6. No food or drink after midnight the night before surgery unless directed otherwise by surgical center/hospital staff. 7. No alcoholic beverages 24-hours prior to surgery.  No smoking 24-hours prior or 24-hours after  surgery. 8. Wear loose pants or shorts. They should be loose enough to fit over bandages, boots, and casts. 9. Don't wear slip-on shoes. Sneakers are preferred. 10. Bring your boot with you to the surgery center/hospital.  Also bring crutches or a walker if your physician has prescribed it for you.  If you do not have this equipment, it will be provided for you after surgery. 11. If you have not been contacted by the surgery center/hospital by the day before your surgery, call to confirm the date and time of your surgery. 12. Leave-time from work may vary depending on the type of surgery you have.  Appropriate arrangements should be made prior to surgery with your employer. 13. Prescriptions will be provided immediately following surgery by your doctor.  Fill these as soon as possible after surgery and take the medication as directed. Pain medications will not be refilled on weekends and must be approved by the doctor. 14. Remove nail polish on the operative foot and avoid getting pedicures prior to surgery. 15. Wash the night before surgery.  The night before surgery wash the foot and leg well with water and the antibacterial soap provided. Be sure to pay special attention to beneath the toenails and in between the toes.  Wash for at least three (3) minutes. Rinse thoroughly with water and dry well with a towel.  Perform this wash unless told not to do so by your physician.  Enclosed: 1 Ice pack (please put in freezer the night before surgery)   1 Hibiclens skin cleaner     Pre-op instructions  If you have any questions regarding the instructions, please do not hesitate to call our office.  [place]: [address], [place], [place] [address] -- [phone]  [place]: [address] [place], [place] [address] -- [phone]  [place]: [address]  [place], [place] [address] -- [phone]  [place]: [address], [address], [place],  [address] -- [phone]  Website:  [email or web address] 

## 2018-05-18 NOTE — Telephone Encounter (Signed)
Faxed required form, clinical and demographics to [facility].

## 2018-05-19 ENCOUNTER — Telehealth: Payer: Self-pay | Admitting: *Deleted

## 2018-05-19 NOTE — Telephone Encounter (Signed)
-----   Message from [place], [place] sent at 05/18/2018  2:59 PM EDT ----- Regarding: [facility] bone stimulator  Patient has fracture at staples on 1st metatarsal right foot and will be undergoing surgery to remove staples and fuse the bone. Patient will need bone stim that can work with cast that I will place at time of surgery -Dr. Chauncey Cruel

## 2018-05-19 NOTE — Telephone Encounter (Signed)
Pt is scheduled for surgery 05/24/2018.

## 2018-05-19 NOTE — Telephone Encounter (Signed)
Grayland Ormond Winter - [facility] states he received the paperwork for pt and will have the bone growth stimulator for the procedure, and has a Medicaid form that needs completion.

## 2018-05-21 NOTE — Telephone Encounter (Signed)
Hailey Patterson - [facility] states pt's medicaid requires Ht and Wt.

## 2018-05-21 NOTE — Telephone Encounter (Signed)
I spoke with pt and she states she is 5'1" and 160lbs. I left message informing Gerlean Ren - [facility] of pt's Ht and Wt.

## 2018-05-24 ENCOUNTER — Encounter: Payer: Self-pay | Admitting: Sports Medicine

## 2018-05-24 DIAGNOSIS — M2011 Hallux valgus (acquired), right foot: Secondary | ICD-10-CM | POA: Diagnosis not present

## 2018-05-24 DIAGNOSIS — M2041 Other hammer toe(s) (acquired), right foot: Secondary | ICD-10-CM | POA: Diagnosis not present

## 2018-05-24 DIAGNOSIS — Q6651 Congenital pes planus, right foot: Secondary | ICD-10-CM | POA: Diagnosis not present

## 2018-05-24 NOTE — Telephone Encounter (Signed)
Hailey Patterson - [facility] states he will either be at the surgery today or Tuesday-Wednesday time frame.

## 2018-05-25 ENCOUNTER — Telehealth: Payer: Self-pay | Admitting: Sports Medicine

## 2018-05-25 NOTE — Telephone Encounter (Signed)
Post op check phone call made to patient. Patient reports that she is doing good. Starting to feel her toes waking up and joking states that she told anesthesia to numb her foot for 2 months. Patient states that she has been complaint with staying off foot; only got up for bathroom and reports that Jimmy left food and water for her and will check her during lunch to make sure she's ok. I advised patient to continue with rest, ice, elevation, and taking her pain medication as directed. Patient expressed understanding and will follow up as scheduled. Dr. Cannon Kettle

## 2018-05-25 NOTE — Telephone Encounter (Signed)
Thanks

## 2018-06-01 ENCOUNTER — Encounter: Payer: Self-pay | Admitting: Sports Medicine

## 2018-06-01 ENCOUNTER — Ambulatory Visit (INDEPENDENT_AMBULATORY_CARE_PROVIDER_SITE_OTHER): Payer: Medicaid Other

## 2018-06-01 ENCOUNTER — Ambulatory Visit (INDEPENDENT_AMBULATORY_CARE_PROVIDER_SITE_OTHER): Payer: Medicaid Other | Admitting: Sports Medicine

## 2018-06-01 VITALS — BP 162/102

## 2018-06-01 DIAGNOSIS — M2011 Hallux valgus (acquired), right foot: Secondary | ICD-10-CM

## 2018-06-01 DIAGNOSIS — M792 Neuralgia and neuritis, unspecified: Secondary | ICD-10-CM

## 2018-06-01 DIAGNOSIS — T8484XA Pain due to internal orthopedic prosthetic devices, implants and grafts, initial encounter: Secondary | ICD-10-CM | POA: Diagnosis not present

## 2018-06-01 DIAGNOSIS — M79671 Pain in right foot: Secondary | ICD-10-CM

## 2018-06-01 DIAGNOSIS — Z9889 Other specified postprocedural states: Secondary | ICD-10-CM

## 2018-06-01 DIAGNOSIS — M21271 Flexion deformity, right ankle and toes: Secondary | ICD-10-CM

## 2018-06-01 MED ORDER — OXYCODONE-ACETAMINOPHEN 10-325 MG PO TABS: 1.0000 | ORAL_TABLET | Freq: Three times a day (TID) | ORAL | 0 refills | Status: DC | PRN

## 2018-06-01 MED ORDER — PROMETHAZINE HCL 25 MG PO TABS: 25.0000 mg | ORAL_TABLET | Freq: Three times a day (TID) | ORAL | 0 refills | Status: DC | PRN

## 2018-06-01 NOTE — Progress Notes (Signed)
Subjective: Hailey Patterson is a 62 y.o. female patient seen today in office for POV #1 (DOS 05/24/18), S/P R 1st ray fusion. Patient started to hyperventilate at this visit and EMS was called.   Patient Active Problem List   Diagnosis Date Noted  . Encounter for screening mammogram for malignant neoplasm of breast 03/03/2017  . Overweight (BMI 25.0-29.9) 08/17/2016  . Carpal tunnel syndrome 07/03/2015  . Abnormal breast finding 04/06/2015  . Itch of skin 04/06/2015  . General patient noncompliance 02/22/2015  . Bunion 09/08/2014  . Bilateral cataracts 09/07/2014  . Breast cancer, right breast ([place]) 09/07/2014  . Chest pain 07/25/2014  . Current tobacco use 10/14/2013  . 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 Tarsal tunnel syndrome 07/15/2013    Current Outpatient Medications on File Prior to Visit  Medication Sig Dispense Refill  . amoxicillin-clavulanate (AUGMENTIN) 875-125 MG tablet Take 1 tablet by mouth 2 (two) times daily. 28 tablet 0  . docusate sodium (COLACE) 100 MG capsule Take 100 mg by mouth 2 (two) times daily as needed for mild constipation.    . docusate sodium (COLACE) 100 MG capsule Take 100 mg by mouth 2 (two) times daily.    Marland Kitchen doxycycline (VIBRA-TABS) 100 MG tablet Take 1 tablet (100 mg total) by mouth 2 (two) times daily. 20 tablet 0  . DULoxetine (CYMBALTA) 60 MG capsule Take 1 capsule (60 mg total) by mouth daily. 30 capsule 2  . lidocaine (LIDODERM) 5 % Place 1 patch onto the skin daily. Remove & Discard patch within 12 hours or as directed by MD 30 patch 0  . meloxicam (MOBIC) 15 MG tablet Take 1 tablet (15 mg total) by mouth daily. 30 tablet 3  . NONFORMULARY OR COMPOUNDED ITEM [facility]  Pain Cream Lidocaine 5% Apply 1-2 grams to affected area 3-4 times daily Qty. 120 gm 1 refills    . oxyCODONE-acetaminophen (PERCOCET) 10-325 MG tablet Take 1 tablet by mouth every 6 (six) hours as needed for pain (Take 1 tablet by mouth every six hours as needed  for pain).    Marland Kitchen oxyCODONE-acetaminophen (PERCOCET) 10-325 MG tablet Take 1 tablet by mouth every 8 (eight) hours as needed for pain. 30 tablet 0  . pregabalin (LYRICA) 75 MG capsule Take 1 capsule (75 mg total) by mouth 2 (two) times daily. 60 capsule 1  . promethazine (PHENERGAN) 25 MG tablet Take 25 mg by mouth every 8 (eight) hours as needed for nausea or vomiting.    . promethazine (PHENERGAN) 25 MG tablet Take 25 mg by mouth every 8 (eight) hours as needed for nausea or vomiting (Take 1 tablet by mouth every eight hours as needed for nausea).    . salicylic acid 17 % gel Apply topically daily. To callus area at bedtime and cover with bandaid 14 g 0   Current Facility-Administered Medications on File Prior to Visit  Medication Dose Route Frequency Provider Last Rate Last Dose  . triamcinolone acetonide (KENALOG) 10 MG/ML injection 10 mg  10 mg Other Once Landis Martins, DPM      . triamcinolone acetonide (KENALOG) 10 MG/ML injection 10 mg  10 mg Other Once Landis Martins, DPM        Allergies  Allergen Reactions  . Macrobid [Nitrofurantoin] Shortness Of Breath and Nausea And Vomiting  . Sulfa Antibiotics Nausea And Vomiting  . Codeine Nausea And Vomiting  . Meperidine Nausea And Vomiting  . Morphine Nausea And Vomiting    Objective: There were no  vitals filed for this visit.  General: No acute distress, AAOx3  Right foot: Sutures intact with no gapping or dehiscence at surgical site, mild swelling to right foot, no erythema, no warmth, no drainage, no signs of infection noted, Capillary fill time <3 seconds in all digits, Mild pain to foot that was relieved with removing the dressings.   Post Op Xray, Right foot: 1st ray, Excellent alignment and position. Osteotomy site healing. Hardware intact. Soft tissue swelling within normal limits for post op status.   Assessment and Plan:  Problem List Items Addressed This Visit    None    Visit Diagnoses    Painful orthopaedic  hardware ([place])    -  Primary   Relevant Orders   DG Foot Complete Right   Acquired hallux valgus of right foot       Hallux interphalangeus, acquired, right       Metatarsus primus elevatus, right       Neuritis       Foot pain, right       S/P foot surgery, right           -Patient seen and evaluated -Xrays reviewed  -Applied dry sterile dressing to surgical site right foot secured with ACE wrap and stockinet with replacement of posterior splint lightly -Continue with bone stim reverse use -Advised patient to continue with NWB Right foot  -Advised patient to limit activity to necessity  -Advised patient to ice and elevate as necessary  -Refilled Percocet -Will plan for suture removal at next office visit. In the meantime, patient to call office if any issues or problems arise. EMS stabilized and assessed patient but she refrained to go to hospital.   Landis Martins, DPM

## 2018-06-08 ENCOUNTER — Ambulatory Visit (INDEPENDENT_AMBULATORY_CARE_PROVIDER_SITE_OTHER): Payer: Medicaid Other | Admitting: Sports Medicine

## 2018-06-08 ENCOUNTER — Encounter: Payer: Self-pay | Admitting: Sports Medicine

## 2018-06-08 ENCOUNTER — Encounter: Payer: Medicaid Other | Admitting: Sports Medicine

## 2018-06-08 DIAGNOSIS — M2011 Hallux valgus (acquired), right foot: Secondary | ICD-10-CM

## 2018-06-08 DIAGNOSIS — M21271 Flexion deformity, right ankle and toes: Secondary | ICD-10-CM

## 2018-06-08 DIAGNOSIS — T8484XA Pain due to internal orthopedic prosthetic devices, implants and grafts, initial encounter: Secondary | ICD-10-CM

## 2018-06-08 DIAGNOSIS — M79671 Pain in right foot: Secondary | ICD-10-CM

## 2018-06-08 DIAGNOSIS — M792 Neuralgia and neuritis, unspecified: Secondary | ICD-10-CM

## 2018-06-08 DIAGNOSIS — Z9889 Other specified postprocedural states: Secondary | ICD-10-CM

## 2018-06-08 NOTE — Progress Notes (Addendum)
Subjective: Hailey Patterson is a 62 y.o. female patient seen today in office for POV #2 (DOS 05/24/18), S/P R 1st ray fusion. Patient is doing much better. Pain improved and reports that she has been using her knee scooter to stay off her foot even though she has been tempted.   Patient Active Problem List   Diagnosis Date Noted  . Encounter for screening mammogram for malignant neoplasm of breast 03/03/2017  . Overweight (BMI 25.0-29.9) 08/17/2016  . Carpal tunnel syndrome 07/03/2015  . Abnormal breast finding 04/06/2015  . Itch of skin 04/06/2015  . General patient noncompliance 02/22/2015  . Bunion 09/08/2014  . Bilateral cataracts 09/07/2014  . Breast cancer, right breast ([place]) 09/07/2014  . Chest pain 07/25/2014  . Current tobacco use 10/14/2013  . 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 Tarsal tunnel syndrome 07/15/2013    Current Outpatient Medications on File Prior to Visit  Medication Sig Dispense Refill  . amoxicillin-clavulanate (AUGMENTIN) 875-125 MG tablet Take 1 tablet by mouth 2 (two) times daily. 28 tablet 0  . docusate sodium (COLACE) 100 MG capsule Take 100 mg by mouth 2 (two) times daily as needed for mild constipation.    . docusate sodium (COLACE) 100 MG capsule Take 100 mg by mouth 2 (two) times daily.    Marland Kitchen doxycycline (VIBRA-TABS) 100 MG tablet Take 1 tablet (100 mg total) by mouth 2 (two) times daily. 20 tablet 0  . DULoxetine (CYMBALTA) 60 MG capsule Take 1 capsule (60 mg total) by mouth daily. 30 capsule 2  . lidocaine (LIDODERM) 5 % Place 1 patch onto the skin daily. Remove & Discard patch within 12 hours or as directed by MD 30 patch 0  . meloxicam (MOBIC) 15 MG tablet Take 1 tablet (15 mg total) by mouth daily. 30 tablet 3  . NONFORMULARY OR COMPOUNDED ITEM [facility]  Pain Cream Lidocaine 5% Apply 1-2 grams to affected area 3-4 times daily Qty. 120 gm 1 refills    . oxyCODONE-acetaminophen (PERCOCET) 10-325 MG tablet Take 1 tablet by mouth every  8 (eight) hours as needed for pain. 20 tablet 0  . pregabalin (LYRICA) 75 MG capsule Take 1 capsule (75 mg total) by mouth 2 (two) times daily. 60 capsule 1  . promethazine (PHENERGAN) 25 MG tablet Take 25 mg by mouth every 8 (eight) hours as needed for nausea or vomiting.    . promethazine (PHENERGAN) 25 MG tablet Take 25 mg by mouth every 8 (eight) hours as needed for nausea or vomiting (Take 1 tablet by mouth every eight hours as needed for nausea).    . promethazine (PHENERGAN) 25 MG tablet Take 1 tablet (25 mg total) by mouth every 8 (eight) hours as needed for nausea or vomiting. 20 tablet 0  . salicylic acid 17 % gel Apply topically daily. To callus area at bedtime and cover with bandaid 14 g 0   Current Facility-Administered Medications on File Prior to Visit  Medication Dose Route Frequency Provider Last Rate Last Dose  . triamcinolone acetonide (KENALOG) 10 MG/ML injection 10 mg  10 mg Other Once Landis Martins, DPM      . triamcinolone acetonide (KENALOG) 10 MG/ML injection 10 mg  10 mg Other Once Landis Martins, DPM        Allergies  Allergen Reactions  . Macrobid [Nitrofurantoin] Shortness Of Breath and Nausea And Vomiting  . Sulfa Antibiotics Nausea And Vomiting  . Codeine Nausea And Vomiting  . Meperidine Nausea And Vomiting  .  Morphine Nausea And Vomiting    Objective: There were no vitals filed for this visit.  General: No acute distress, AAOx3  Right foot: Sutures intact with no gapping or dehiscence at surgical site, mild swelling to right foot, no erythema, no warmth, no drainage, no signs of infection noted, Capillary fill time <3 seconds in all digits, Mild pain to foot with light touch.  .   Assessment and Plan:  Problem List Items Addressed This Visit    None    Visit Diagnoses    S/P foot surgery, right    -  Primary   Painful orthopaedic hardware ([place])       Hallux interphalangeus, acquired, right       Acquired hallux valgus of right foot        Metatarsus primus elevatus, right       Neuritis       Foot pain, right           -Patient seen and evaluated -Applied short leg cast on right  -Continue with bone stim even over cast  -Advised patient to continue with NWB Right foot  -Advised patient to limit activity to necessity  -Advised patient to ice and elevate as necessary  -Cont Percocet for pain and Asiprin for DVT prophylaxis  -Will plan for cast check at next office visit.  Landis Martins, DPM

## 2018-06-15 ENCOUNTER — Encounter: Payer: Self-pay | Admitting: Sports Medicine

## 2018-06-15 ENCOUNTER — Encounter

## 2018-06-15 ENCOUNTER — Ambulatory Visit (INDEPENDENT_AMBULATORY_CARE_PROVIDER_SITE_OTHER): Payer: Self-pay | Admitting: Sports Medicine

## 2018-06-15 ENCOUNTER — Encounter: Payer: Medicaid Other | Admitting: Sports Medicine

## 2018-06-15 VITALS — BP 142/92 | HR 91 | Temp 96.8°F

## 2018-06-15 DIAGNOSIS — M792 Neuralgia and neuritis, unspecified: Secondary | ICD-10-CM

## 2018-06-15 DIAGNOSIS — Z9889 Other specified postprocedural states: Secondary | ICD-10-CM

## 2018-06-15 DIAGNOSIS — T8484XA Pain due to internal orthopedic prosthetic devices, implants and grafts, initial encounter: Secondary | ICD-10-CM

## 2018-06-15 DIAGNOSIS — M2011 Hallux valgus (acquired), right foot: Secondary | ICD-10-CM

## 2018-06-15 DIAGNOSIS — M79671 Pain in right foot: Secondary | ICD-10-CM

## 2018-06-15 DIAGNOSIS — M21271 Flexion deformity, right ankle and toes: Secondary | ICD-10-CM

## 2018-06-15 MED ORDER — OXYCODONE-ACETAMINOPHEN 10-325 MG PO TABS: 1.0000 | ORAL_TABLET | Freq: Three times a day (TID) | ORAL | 0 refills | Status: DC | PRN

## 2018-06-15 NOTE — Progress Notes (Signed)
Subjective: Hailey Patterson is a 62 y.o. female patient seen today in office for POV #3 (DOS 05/24/18), S/P R 1st ray fusion. Patient is having pain burning at time on top of foot but otherwise she is doing ok. 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    Patient Active Problem List   Diagnosis Date Noted  . Encounter for screening mammogram for malignant neoplasm of breast 03/03/2017  . Overweight (BMI 25.0-29.9) 08/17/2016  . Carpal tunnel syndrome 07/03/2015  . Abnormal breast finding 04/06/2015  . Itch of skin 04/06/2015  . General patient noncompliance 02/22/2015  . Bunion 09/08/2014  . Bilateral cataracts 09/07/2014  . Breast cancer, right breast ([place]) 09/07/2014  . Chest pain 07/25/2014  . Current tobacco use 10/14/2013  . 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 Tarsal tunnel syndrome 07/15/2013    Current Outpatient Medications on File Prior to Visit  Medication Sig Dispense Refill  . amoxicillin-clavulanate (AUGMENTIN) 875-125 MG tablet Take 1 tablet by mouth 2 (two) times daily. 28 tablet 0  . docusate sodium (COLACE) 100 MG capsule Take 100 mg by mouth 2 (two) times daily as needed for mild constipation.    . docusate sodium (COLACE) 100 MG capsule Take 100 mg by mouth 2 (two) times daily.    Marland Kitchen doxycycline (VIBRA-TABS) 100 MG tablet Take 1 tablet (100 mg total) by mouth 2 (two) times daily. 20 tablet 0  . DULoxetine (CYMBALTA) 60 MG capsule Take 1 capsule (60 mg total) by mouth daily. 30 capsule 2  . lidocaine (LIDODERM) 5 % Place 1 patch onto the skin daily. Remove & Discard patch within 12 hours or as directed by MD 30 patch 0  . meloxicam (MOBIC) 15 MG tablet Take 1 tablet (15 mg total) by mouth daily. 30 tablet 3  . NONFORMULARY OR COMPOUNDED ITEM [facility]  Pain Cream Lidocaine 5% Apply 1-2 grams to affected area 3-4 times daily Qty. 120 gm 1  refills    . pregabalin (LYRICA) 75 MG capsule Take 1 capsule (75 mg total) by mouth 2 (two) times daily. 60 capsule 1  . promethazine (PHENERGAN) 25 MG tablet Take 25 mg by mouth every 8 (eight) hours as needed for nausea or vomiting.    . promethazine (PHENERGAN) 25 MG tablet Take 25 mg by mouth every 8 (eight) hours as needed for nausea or vomiting (Take 1 tablet by mouth every eight hours as needed for nausea).    . promethazine (PHENERGAN) 25 MG tablet Take 1 tablet (25 mg total) by mouth every 8 (eight) hours as needed for nausea or vomiting. 20 tablet 0  . salicylic acid 17 % gel Apply topically daily. To callus area at bedtime and cover with bandaid 14 g 0   Current Facility-Administered Medications on File Prior to Visit  Medication Dose Route Frequency Provider Last Rate Last Dose  . triamcinolone acetonide (KENALOG) 10 MG/ML injection 10 mg  10 mg Other Once Landis Martins, DPM      . triamcinolone acetonide (KENALOG) 10 MG/ML injection 10 mg  10 mg Other Once Landis Martins, DPM        Allergies  Allergen Reactions  . Macrobid [Nitrofurantoin] Shortness Of Breath and Nausea And Vomiting  . Sulfa Antibiotics Nausea And Vomiting  . Codeine Nausea And Vomiting  . Meperidine Nausea And Vomiting  . Morphine Nausea And  Vomiting    Objective: There were no vitals filed for this visit.  General: No acute distress, AAOx3  Right foot: Cast intact. No erythema, no warmth, no drainage, no signs of infection noted extending beyond cast, Capillary fill time <3 seconds in all digits that are exposed, No calf pain.  .   Assessment and Plan:  Problem List Items Addressed This Visit    None    Visit Diagnoses    Painful orthopaedic hardware ([place])    -  Primary   S/P foot surgery, right       Relevant Medications   oxyCODONE-acetaminophen (PERCOCET) 10-325 MG tablet   Hallux interphalangeus, acquired, right       Acquired hallux valgus of right foot       Metatarsus primus  elevatus, right       Neuritis       Foot pain, right           -Patient seen and evaluated -Cast check performed -Continue with fiberglass cast to right foot  -Continue with bone stim over cast  -Advised patient to continue with NWB Right foot  With use of scooter -Advised patient to limit activity to necessity  -Advised patient to ice and elevate as necessary  -Cont Percocet for pain and Asiprin for DVT prophylaxis -Will plan for cast removal, xray, and possible sutures at next office visit.  Landis Martins, DPM

## 2018-06-22 ENCOUNTER — Ambulatory Visit (INDEPENDENT_AMBULATORY_CARE_PROVIDER_SITE_OTHER): Payer: Medicaid Other | Admitting: Sports Medicine

## 2018-06-22 ENCOUNTER — Ambulatory Visit (INDEPENDENT_AMBULATORY_CARE_PROVIDER_SITE_OTHER): Payer: Medicaid Other

## 2018-06-22 ENCOUNTER — Encounter: Payer: Self-pay | Admitting: Sports Medicine

## 2018-06-22 VITALS — BP 131/87 | HR 91 | Resp 16

## 2018-06-22 DIAGNOSIS — M792 Neuralgia and neuritis, unspecified: Secondary | ICD-10-CM

## 2018-06-22 DIAGNOSIS — M79671 Pain in right foot: Secondary | ICD-10-CM

## 2018-06-22 DIAGNOSIS — M21271 Flexion deformity, right ankle and toes: Secondary | ICD-10-CM

## 2018-06-22 DIAGNOSIS — M2011 Hallux valgus (acquired), right foot: Secondary | ICD-10-CM

## 2018-06-22 DIAGNOSIS — T8484XA Pain due to internal orthopedic prosthetic devices, implants and grafts, initial encounter: Secondary | ICD-10-CM

## 2018-06-22 DIAGNOSIS — Z9889 Other specified postprocedural states: Secondary | ICD-10-CM

## 2018-06-22 NOTE — Progress Notes (Signed)
Subjective: Hailey Patterson is a 62 y.o. female patient seen today in office for POV #4 (DOS 05/24/18), S/P R 1st ray fusion. Patient is having pain burning at time on top of foot but otherwise she is doing ok. Wants to go see her Dog in [place]. Denies nausea, vomiting, fever, or chills.   Patient Active Problem List   Diagnosis Date Noted  . Encounter for screening mammogram for malignant neoplasm of breast 03/03/2017  . Overweight (BMI 25.0-29.9) 08/17/2016  . Carpal tunnel syndrome 07/03/2015  . Abnormal breast finding 04/06/2015  . Itch of skin 04/06/2015  . General patient noncompliance 02/22/2015  . Bunion 09/08/2014  . Bilateral cataracts 09/07/2014  . Breast cancer, right breast ([place]) 09/07/2014  . Chest pain 07/25/2014  . Current tobacco use 10/14/2013  . 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 Tarsal tunnel syndrome 07/15/2013    Current Outpatient Medications on File Prior to Visit  Medication Sig Dispense Refill  . amoxicillin-clavulanate (AUGMENTIN) 875-125 MG tablet Take 1 tablet by mouth 2 (two) times daily. 28 tablet 0  . docusate sodium (COLACE) 100 MG capsule Take 100 mg by mouth 2 (two) times daily as needed for mild constipation.    . docusate sodium (COLACE) 100 MG capsule Take 100 mg by mouth 2 (two) times daily.    Marland Kitchen doxycycline (VIBRA-TABS) 100 MG tablet Take 1 tablet (100 mg total) by mouth 2 (two) times daily. 20 tablet 0  . DULoxetine (CYMBALTA) 60 MG capsule Take 1 capsule (60 mg total) by mouth daily. 30 capsule 2  . lidocaine (LIDODERM) 5 % Place 1 patch onto the skin daily. Remove & Discard patch within 12 hours or as directed by MD 30 patch 0  . meloxicam (MOBIC) 15 MG tablet Take 1 tablet (15 mg total) by mouth daily. 30 tablet 3  . NONFORMULARY OR COMPOUNDED ITEM [facility]  Pain Cream Lidocaine 5% Apply 1-2 grams to affected area 3-4 times daily Qty. 120 gm 1 refills    . oxyCODONE-acetaminophen (PERCOCET) 10-325 MG tablet Take 1 tablet  by mouth every 8 (eight) hours as needed for pain. 20 tablet 0  . pregabalin (LYRICA) 75 MG capsule Take 1 capsule (75 mg total) by mouth 2 (two) times daily. 60 capsule 1  . promethazine (PHENERGAN) 25 MG tablet Take 25 mg by mouth every 8 (eight) hours as needed for nausea or vomiting.    . promethazine (PHENERGAN) 25 MG tablet Take 25 mg by mouth every 8 (eight) hours as needed for nausea or vomiting (Take 1 tablet by mouth every eight hours as needed for nausea).    . promethazine (PHENERGAN) 25 MG tablet Take 1 tablet (25 mg total) by mouth every 8 (eight) hours as needed for nausea or vomiting. 20 tablet 0  . salicylic acid 17 % gel Apply topically daily. To callus area at bedtime and cover with bandaid 14 g 0   Current Facility-Administered Medications on File Prior to Visit  Medication Dose Route Frequency Provider Last Rate Last Dose  . triamcinolone acetonide (KENALOG) 10 MG/ML injection 10 mg  10 mg Other Once Landis Martins, DPM      . triamcinolone acetonide (KENALOG) 10 MG/ML injection 10 mg  10 mg Other Once Landis Martins, DPM        Allergies  Allergen Reactions  . Macrobid [Nitrofurantoin] Shortness Of Breath and Nausea And Vomiting  . Sulfa Antibiotics Nausea And Vomiting  . Codeine Nausea And Vomiting  . Meperidine Nausea And  Vomiting  . Morphine Nausea And Vomiting    Objective: There were no vitals filed for this visit.  General: No acute distress, AAOx3  Right foot:Incision intact. No erythema, no warmth, no drainage, no signs of infection noted, focal swelling, pain with palpation to forefoot, Capillary fill time <3 seconds in all digits, No calf pain.   Xrays hardware intact   Assessment and Plan:  Problem List Items Addressed This Visit    None    Visit Diagnoses    Painful orthopaedic hardware ([place])    -  Primary   Relevant Orders   DG Foot Complete Right   Hallux interphalangeus, acquired, right       Relevant Orders   DG Foot Complete Right    Metatarsus primus elevatus, right       Relevant Orders   DG Foot Complete Right   S/P foot surgery, right       Neuritis       Foot pain, right           -Patient seen and evaluated -Xrays within normal limits for post op status -A few sutures were removed  -Continue with fiberglass cast to right foot; Reapplied this visit  -Continue with bone stim over cast  -Advised patient to continue with NWB Right foot  With use of scooter -Advised patient to limit activity to necessity  -Advised patient to ice and elevate as necessary  -Cont Percocet for pain and Asiprin for DVT prophylaxis -Will plan for cast change and finishing sutures at next office visit.  Landis Martins, DPM

## 2018-07-06 ENCOUNTER — Ambulatory Visit (INDEPENDENT_AMBULATORY_CARE_PROVIDER_SITE_OTHER): Payer: Self-pay | Admitting: Sports Medicine

## 2018-07-06 DIAGNOSIS — M79671 Pain in right foot: Secondary | ICD-10-CM

## 2018-07-06 DIAGNOSIS — Z9889 Other specified postprocedural states: Secondary | ICD-10-CM

## 2018-07-06 DIAGNOSIS — M792 Neuralgia and neuritis, unspecified: Secondary | ICD-10-CM

## 2018-07-06 DIAGNOSIS — M21271 Flexion deformity, right ankle and toes: Secondary | ICD-10-CM

## 2018-07-06 DIAGNOSIS — T8484XA Pain due to internal orthopedic prosthetic devices, implants and grafts, initial encounter: Secondary | ICD-10-CM

## 2018-07-06 DIAGNOSIS — M2011 Hallux valgus (acquired), right foot: Secondary | ICD-10-CM

## 2018-07-06 NOTE — Progress Notes (Signed)
Subjective: Hailey Patterson is a 62 y.o. female patient seen today in office for POV #5 (DOS 05/24/18), S/P R 1st ray fusion. Patient admits to some pain to foot but is doing better. Admits to my nurse that she did some cleaning and activities around the Pool but did not put pressure on the foot. Denies nausea, vomiting, fever, or chills.   Patient Active Problem List   Diagnosis Date Noted  . Encounter for screening mammogram for malignant neoplasm of breast 03/03/2017  . Overweight (BMI 25.0-29.9) 08/17/2016  . Carpal tunnel syndrome 07/03/2015  . Abnormal breast finding 04/06/2015  . Itch of skin 04/06/2015  . General patient noncompliance 02/22/2015  . Bunion 09/08/2014  . Bilateral cataracts 09/07/2014  . Breast cancer, right breast ([place]) 09/07/2014  . Chest pain 07/25/2014  . Current tobacco use 10/14/2013  . 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 Tarsal tunnel syndrome 07/15/2013    Current Outpatient Medications on File Prior to Visit  Medication Sig Dispense Refill  . amoxicillin-clavulanate (AUGMENTIN) 875-125 MG tablet Take 1 tablet by mouth 2 (two) times daily. 28 tablet 0  . docusate sodium (COLACE) 100 MG capsule Take 100 mg by mouth 2 (two) times daily as needed for mild constipation.    . docusate sodium (COLACE) 100 MG capsule Take 100 mg by mouth 2 (two) times daily.    Marland Kitchen doxycycline (VIBRA-TABS) 100 MG tablet Take 1 tablet (100 mg total) by mouth 2 (two) times daily. 20 tablet 0  . DULoxetine (CYMBALTA) 60 MG capsule Take 1 capsule (60 mg total) by mouth daily. 30 capsule 2  . lidocaine (LIDODERM) 5 % Place 1 patch onto the skin daily. Remove & Discard patch within 12 hours or as directed by MD 30 patch 0  . meloxicam (MOBIC) 15 MG tablet Take 1 tablet (15 mg total) by mouth daily. 30 tablet 3  . NONFORMULARY OR COMPOUNDED ITEM [facility]  Pain Cream Lidocaine 5% Apply 1-2 grams to affected area 3-4 times daily Qty. 120 gm 1 refills    .  oxyCODONE-acetaminophen (PERCOCET) 10-325 MG tablet Take 1 tablet by mouth every 8 (eight) hours as needed for pain. 20 tablet 0  . pregabalin (LYRICA) 75 MG capsule Take 1 capsule (75 mg total) by mouth 2 (two) times daily. 60 capsule 1  . promethazine (PHENERGAN) 25 MG tablet Take 25 mg by mouth every 8 (eight) hours as needed for nausea or vomiting.    . promethazine (PHENERGAN) 25 MG tablet Take 25 mg by mouth every 8 (eight) hours as needed for nausea or vomiting (Take 1 tablet by mouth every eight hours as needed for nausea).    . promethazine (PHENERGAN) 25 MG tablet Take 1 tablet (25 mg total) by mouth every 8 (eight) hours as needed for nausea or vomiting. 20 tablet 0  . salicylic acid 17 % gel Apply topically daily. To callus area at bedtime and cover with bandaid 14 g 0   Current Facility-Administered Medications on File Prior to Visit  Medication Dose Route Frequency Provider Last Rate Last Dose  . triamcinolone acetonide (KENALOG) 10 MG/ML injection 10 mg  10 mg Other Once Landis Martins, DPM      . triamcinolone acetonide (KENALOG) 10 MG/ML injection 10 mg  10 mg Other Once Landis Martins, DPM        Allergies  Allergen Reactions  . Macrobid [Nitrofurantoin] Shortness Of Breath and Nausea And Vomiting  . Sulfa Antibiotics Nausea And Vomiting  . Codeine  Nausea And Vomiting  . Meperidine Nausea And Vomiting  . Morphine Nausea And Vomiting    Objective: There were no vitals filed for this visit.  General: No acute distress, AAOx3  Right foot:Incision intact. No erythema, no warmth, no drainage, no signs of infection noted, focal swelling, pain with palpation to forefoot, Capillary fill time <3 seconds in all digits, No calf pain.   Assessment and Plan:  Problem List Items Addressed This Visit    None    Visit Diagnoses    Hallux interphalangeus, acquired, right    -  Primary   Metatarsus primus elevatus, right       S/P foot surgery, right       Neuritis       Foot  pain, right       Acquired hallux valgus of right foot       Painful orthopaedic hardware [facility])           -Patient seen and evaluated -All sutures were removed  -Continue with fiberglass cast to right foot; Reapplied this visit  -Continue with bone stim over cast  -Advised patient to continue with NWB Right foot with use of scooter -Advised patient to limit activity to necessity  -Advised patient to ice and elevate as necessary  -Cont Percocet for pain and Asiprin for DVT prophylaxis -Will plan for cast change at next office visit.  Landis Martins, DPM

## 2018-07-20 ENCOUNTER — Encounter: Payer: Self-pay | Admitting: Sports Medicine

## 2018-07-20 ENCOUNTER — Ambulatory Visit (INDEPENDENT_AMBULATORY_CARE_PROVIDER_SITE_OTHER): Payer: Medicaid Other | Admitting: Sports Medicine

## 2018-07-20 DIAGNOSIS — M21271 Flexion deformity, right ankle and toes: Secondary | ICD-10-CM

## 2018-07-20 DIAGNOSIS — Z9889 Other specified postprocedural states: Secondary | ICD-10-CM

## 2018-07-20 DIAGNOSIS — M2011 Hallux valgus (acquired), right foot: Secondary | ICD-10-CM

## 2018-07-20 DIAGNOSIS — M79671 Pain in right foot: Secondary | ICD-10-CM

## 2018-07-20 DIAGNOSIS — M792 Neuralgia and neuritis, unspecified: Secondary | ICD-10-CM

## 2018-07-20 NOTE — Progress Notes (Signed)
Subjective: Hailey Patterson is a 62 y.o. female patient seen today in office for POV #6 (DOS 05/24/18), S/P R 1st ray fusion. Patient admits to some pain to foot but better than before, patient is ready to have cast off, admits that she was out at pool and bumped her toes. Denies nausea, vomiting, fever, or chills.   Patient Active Problem List   Diagnosis Date Noted  . Encounter for screening mammogram for malignant neoplasm of breast 03/03/2017  . Overweight (BMI 25.0-29.9) 08/17/2016  . Carpal tunnel syndrome 07/03/2015  . Abnormal breast finding 04/06/2015  . Itch of skin 04/06/2015  . General patient noncompliance 02/22/2015  . Bunion 09/08/2014  . Bilateral cataracts 09/07/2014  . Breast cancer, right breast ([place]) 09/07/2014  . Chest pain 07/25/2014  . Current tobacco use 10/14/2013  . 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 Tarsal tunnel syndrome 07/15/2013    Current Outpatient Medications on File Prior to Visit  Medication Sig Dispense Refill  . amoxicillin-clavulanate (AUGMENTIN) 875-125 MG tablet Take 1 tablet by mouth 2 (two) times daily. 28 tablet 0  . docusate sodium (COLACE) 100 MG capsule Take 100 mg by mouth 2 (two) times daily as needed for mild constipation.    . docusate sodium (COLACE) 100 MG capsule Take 100 mg by mouth 2 (two) times daily.    Marland Kitchen doxycycline (VIBRA-TABS) 100 MG tablet Take 1 tablet (100 mg total) by mouth 2 (two) times daily. 20 tablet 0  . DULoxetine (CYMBALTA) 60 MG capsule Take 1 capsule (60 mg total) by mouth daily. 30 capsule 2  . lidocaine (LIDODERM) 5 % Place 1 patch onto the skin daily. Remove & Discard patch within 12 hours or as directed by MD 30 patch 0  . meloxicam (MOBIC) 15 MG tablet Take 1 tablet (15 mg total) by mouth daily. 30 tablet 3  . NONFORMULARY OR COMPOUNDED ITEM [facility]  Pain Cream Lidocaine 5% Apply 1-2 grams to affected area 3-4 times daily Qty. 120 gm 1 refills    . oxyCODONE-acetaminophen (PERCOCET) 10-325  MG tablet Take 1 tablet by mouth every 8 (eight) hours as needed for pain. 20 tablet 0  . pregabalin (LYRICA) 75 MG capsule Take 1 capsule (75 mg total) by mouth 2 (two) times daily. 60 capsule 1  . promethazine (PHENERGAN) 25 MG tablet Take 25 mg by mouth every 8 (eight) hours as needed for nausea or vomiting.    . promethazine (PHENERGAN) 25 MG tablet Take 25 mg by mouth every 8 (eight) hours as needed for nausea or vomiting (Take 1 tablet by mouth every eight hours as needed for nausea).    . promethazine (PHENERGAN) 25 MG tablet Take 1 tablet (25 mg total) by mouth every 8 (eight) hours as needed for nausea or vomiting. 20 tablet 0  . salicylic acid 17 % gel Apply topically daily. To callus area at bedtime and cover with bandaid 14 g 0   Current Facility-Administered Medications on File Prior to Visit  Medication Dose Route Frequency Provider Last Rate Last Dose  . triamcinolone acetonide (KENALOG) 10 MG/ML injection 10 mg  10 mg Other Once Landis Martins, DPM      . triamcinolone acetonide (KENALOG) 10 MG/ML injection 10 mg  10 mg Other Once Landis Martins, DPM        Allergies  Allergen Reactions  . Macrobid [Nitrofurantoin] Shortness Of Breath and Nausea And Vomiting  . Sulfa Antibiotics Nausea And Vomiting  . Codeine Nausea And Vomiting  .  Meperidine Nausea And Vomiting  . Morphine Nausea And Vomiting    Objective: There were no vitals filed for this visit.  General: No acute distress, AAOx3  Right foot:Incision intact. No erythema, no warmth, no drainage, no signs of infection noted, focal swelling to forefoot, pain with palpation to forefoot and guarding, rectus 1st toe, Capillary fill time <3 seconds in all digits, No calf pain.   Assessment and Plan:  Problem List Items Addressed This Visit    None    Visit Diagnoses    Hallux interphalangeus, acquired, right    -  Primary   Metatarsus primus elevatus, right       Neuritis       Acquired hallux valgus of right foot        S/P foot surgery, right       Foot pain, right           -Patient seen and evaluated -Remove remaining sutures  -Continue with fiberglass cast to right foot; Reapplied this visit  -Continue with bone stim over cast  -Advised patient to continue with NWB Right foot with use of scooter until next office visit -Advised patient to limit activity to necessity  -Advised patient to ice and elevate as necessary  -Cont Percocet for pain and Asiprin for DVT prophylaxis -Will plan for xray and cast removal and protective weightbearing with CAM boot and PT  at next office visit.  Landis Martins, DPM

## 2018-08-03 ENCOUNTER — Encounter: Payer: Self-pay | Admitting: Sports Medicine

## 2018-08-03 ENCOUNTER — Ambulatory Visit (INDEPENDENT_AMBULATORY_CARE_PROVIDER_SITE_OTHER): Payer: Self-pay | Admitting: Sports Medicine

## 2018-08-03 DIAGNOSIS — M21271 Flexion deformity, right ankle and toes: Secondary | ICD-10-CM

## 2018-08-03 DIAGNOSIS — Z9889 Other specified postprocedural states: Secondary | ICD-10-CM

## 2018-08-03 DIAGNOSIS — M79671 Pain in right foot: Secondary | ICD-10-CM

## 2018-08-03 DIAGNOSIS — M2011 Hallux valgus (acquired), right foot: Secondary | ICD-10-CM

## 2018-08-03 DIAGNOSIS — M792 Neuralgia and neuritis, unspecified: Secondary | ICD-10-CM

## 2018-08-03 NOTE — Progress Notes (Signed)
Subjective: Hailey Patterson is a 62 y.o. female patient seen today in office for POV #7 (DOS 05/24/18), S/P R 1st ray fusion. Patient admits to some pain to foot but better than before and is eager to walk and have cast off, Denies nausea, vomiting, fever, or chills, or any constitutional symptoms.   Patient Active Problem List   Diagnosis Date Noted  . Encounter for screening mammogram for malignant neoplasm of breast 03/03/2017  . Overweight (BMI 25.0-29.9) 08/17/2016  . Carpal tunnel syndrome 07/03/2015  . Abnormal breast finding 04/06/2015  . Itch of skin 04/06/2015  . General patient noncompliance 02/22/2015  . Bunion 09/08/2014  . Bilateral cataracts 09/07/2014  . Breast cancer, right breast ([place]) 09/07/2014  . Chest pain 07/25/2014  . Current tobacco use 10/14/2013  . 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 Tarsal tunnel syndrome 07/15/2013    Current Outpatient Medications on File Prior to Visit  Medication Sig Dispense Refill  . amoxicillin-clavulanate (AUGMENTIN) 875-125 MG tablet Take 1 tablet by mouth 2 (two) times daily. 28 tablet 0  . docusate sodium (COLACE) 100 MG capsule Take 100 mg by mouth 2 (two) times daily as needed for mild constipation.    . docusate sodium (COLACE) 100 MG capsule Take 100 mg by mouth 2 (two) times daily.    Marland Kitchen doxycycline (VIBRA-TABS) 100 MG tablet Take 1 tablet (100 mg total) by mouth 2 (two) times daily. 20 tablet 0  . DULoxetine (CYMBALTA) 60 MG capsule Take 1 capsule (60 mg total) by mouth daily. 30 capsule 2  . lidocaine (LIDODERM) 5 % Place 1 patch onto the skin daily. Remove & Discard patch within 12 hours or as directed by MD 30 patch 0  . meloxicam (MOBIC) 15 MG tablet Take 1 tablet (15 mg total) by mouth daily. 30 tablet 3  . NONFORMULARY OR COMPOUNDED ITEM [facility]  Pain Cream Lidocaine 5% Apply 1-2 grams to affected area 3-4 times daily Qty. 120 gm 1 refills    . oxyCODONE-acetaminophen (PERCOCET) 10-325 MG tablet Take 1  tablet by mouth every 8 (eight) hours as needed for pain. 20 tablet 0  . pregabalin (LYRICA) 75 MG capsule Take 1 capsule (75 mg total) by mouth 2 (two) times daily. 60 capsule 1  . promethazine (PHENERGAN) 25 MG tablet Take 25 mg by mouth every 8 (eight) hours as needed for nausea or vomiting.    . promethazine (PHENERGAN) 25 MG tablet Take 25 mg by mouth every 8 (eight) hours as needed for nausea or vomiting (Take 1 tablet by mouth every eight hours as needed for nausea).    . promethazine (PHENERGAN) 25 MG tablet Take 1 tablet (25 mg total) by mouth every 8 (eight) hours as needed for nausea or vomiting. 20 tablet 0  . salicylic acid 17 % gel Apply topically daily. To callus area at bedtime and cover with bandaid 14 g 0   Current Facility-Administered Medications on File Prior to Visit  Medication Dose Route Frequency Provider Last Rate Last Dose  . triamcinolone acetonide (KENALOG) 10 MG/ML injection 10 mg  10 mg Other Once Landis Martins, DPM      . triamcinolone acetonide (KENALOG) 10 MG/ML injection 10 mg  10 mg Other Once Landis Martins, DPM        Allergies  Allergen Reactions  . Macrobid [Nitrofurantoin] Shortness Of Breath and Nausea And Vomiting  . Sulfa Antibiotics Nausea And Vomiting  . Codeine Nausea And Vomiting  . Meperidine Nausea And Vomiting  .  Morphine Nausea And Vomiting    Objective: There were no vitals filed for this visit.  General: No acute distress, AAOx3  Right foot:Incision intact. No erythema, no warmth, no drainage, no signs of infection noted, focal swelling to forefoot, pain with palpation to forefoot and guarding, rectus 1st toe, Capillary fill time <3 seconds in all digits, No calf pain.   Assessment and Plan:  Problem List Items Addressed This Visit    None    Visit Diagnoses    Hallux interphalangeus, acquired, right    -  Primary   Metatarsus primus elevatus, right       Neuritis       Acquired hallux valgus of right foot       S/P foot  surgery, right       Foot pain, right           -Patient seen and evaluated -Patient may weight bear with CAM boot no more than 2 hours at a time for this week -Advised patient to limit activity to necessity  -Advised patient to ice and elevate as necessary  -Rx PT, may come out of boot for PT -Will plan for xray and f/u to see how she is doing with PT  at next office visit.  Landis Martins, DPM

## 2018-08-10 ENCOUNTER — Other Ambulatory Visit: Payer: Self-pay

## 2018-08-10 DIAGNOSIS — M21271 Flexion deformity, right ankle and toes: Secondary | ICD-10-CM

## 2018-08-10 DIAGNOSIS — Z9889 Other specified postprocedural states: Secondary | ICD-10-CM

## 2018-08-10 DIAGNOSIS — S92311A Displaced fracture of first metatarsal bone, right foot, initial encounter for closed fracture: Secondary | ICD-10-CM

## 2018-08-10 DIAGNOSIS — M2011 Hallux valgus (acquired), right foot: Secondary | ICD-10-CM

## 2018-08-10 NOTE — Progress Notes (Signed)
Referral to PT [facility] physical therapy [address] Fax [phone]  Gait instability strengthening and range of motion 2 times a week x4 weeks.

## 2018-08-17 ENCOUNTER — Encounter: Payer: Self-pay | Admitting: Sports Medicine

## 2018-08-17 ENCOUNTER — Ambulatory Visit (INDEPENDENT_AMBULATORY_CARE_PROVIDER_SITE_OTHER): Payer: Medicaid Other | Admitting: Sports Medicine

## 2018-08-17 ENCOUNTER — Ambulatory Visit (INDEPENDENT_AMBULATORY_CARE_PROVIDER_SITE_OTHER): Payer: Medicaid Other

## 2018-08-17 DIAGNOSIS — M79671 Pain in right foot: Secondary | ICD-10-CM

## 2018-08-17 DIAGNOSIS — M2011 Hallux valgus (acquired), right foot: Secondary | ICD-10-CM

## 2018-08-17 DIAGNOSIS — M21271 Flexion deformity, right ankle and toes: Secondary | ICD-10-CM

## 2018-08-17 DIAGNOSIS — Z9889 Other specified postprocedural states: Secondary | ICD-10-CM

## 2018-08-17 DIAGNOSIS — M25571 Pain in right ankle and joints of right foot: Secondary | ICD-10-CM

## 2018-08-17 MED ORDER — OXYCODONE-ACETAMINOPHEN 10-325 MG PO TABS: 1.0000 | ORAL_TABLET | Freq: Three times a day (TID) | ORAL | 0 refills | Status: DC | PRN

## 2018-08-17 NOTE — Progress Notes (Signed)
Subjective: Hailey Patterson is a 62 y.o. female patient seen today in office for POV #8 (DOS 05/24/18), S/P R 1st ray fusion. Patient admits to pain with walking can not walk more than 20 mins, states her ankle is hurting and her heel is hurting,reports that she concerned about incision and how its healing, Denies nausea, vomiting, fever, or chills, or any constitutional symptoms.   Patient Active Problem List   Diagnosis Date Noted  . Encounter for screening mammogram for malignant neoplasm of breast 03/03/2017  . Overweight (BMI 25.0-29.9) 08/17/2016  . Carpal tunnel syndrome 07/03/2015  . Abnormal breast finding 04/06/2015  . Itch of skin 04/06/2015  . General patient noncompliance 02/22/2015  . Bunion 09/08/2014  . Bilateral cataracts 09/07/2014  . Breast cancer, right breast ([place]) 09/07/2014  . Chest pain 07/25/2014  . Current tobacco use 10/14/2013  . 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 Tarsal tunnel syndrome 07/15/2013    Current Outpatient Medications on File Prior to Visit  Medication Sig Dispense Refill  . amoxicillin-clavulanate (AUGMENTIN) 875-125 MG tablet Take 1 tablet by mouth 2 (two) times daily. 28 tablet 0  . docusate sodium (COLACE) 100 MG capsule Take 100 mg by mouth 2 (two) times daily as needed for mild constipation.    . docusate sodium (COLACE) 100 MG capsule Take 100 mg by mouth 2 (two) times daily.    Marland Kitchen doxycycline (VIBRA-TABS) 100 MG tablet Take 1 tablet (100 mg total) by mouth 2 (two) times daily. 20 tablet 0  . DULoxetine (CYMBALTA) 60 MG capsule Take 1 capsule (60 mg total) by mouth daily. 30 capsule 2  . lidocaine (LIDODERM) 5 % Place 1 patch onto the skin daily. Remove & Discard patch within 12 hours or as directed by MD 30 patch 0  . meloxicam (MOBIC) 15 MG tablet Take 1 tablet (15 mg total) by mouth daily. 30 tablet 3  . NONFORMULARY OR COMPOUNDED ITEM [facility]  Pain Cream Lidocaine 5% Apply 1-2 grams to affected area 3-4 times  daily Qty. 120 gm 1 refills    . pregabalin (LYRICA) 75 MG capsule Take 1 capsule (75 mg total) by mouth 2 (two) times daily. 60 capsule 1  . promethazine (PHENERGAN) 25 MG tablet Take 25 mg by mouth every 8 (eight) hours as needed for nausea or vomiting.    . promethazine (PHENERGAN) 25 MG tablet Take 25 mg by mouth every 8 (eight) hours as needed for nausea or vomiting (Take 1 tablet by mouth every eight hours as needed for nausea).    . promethazine (PHENERGAN) 25 MG tablet Take 1 tablet (25 mg total) by mouth every 8 (eight) hours as needed for nausea or vomiting. 20 tablet 0  . salicylic acid 17 % gel Apply topically daily. To callus area at bedtime and cover with bandaid 14 g 0   Current Facility-Administered Medications on File Prior to Visit  Medication Dose Route Frequency Provider Last Rate Last Dose  . triamcinolone acetonide (KENALOG) 10 MG/ML injection 10 mg  10 mg Other Once Landis Martins, DPM      . triamcinolone acetonide (KENALOG) 10 MG/ML injection 10 mg  10 mg Other Once Landis Martins, DPM        Allergies  Allergen Reactions  . Macrobid [Nitrofurantoin] Shortness Of Breath and Nausea And Vomiting  . Sulfa Antibiotics Nausea And Vomiting  . Codeine Nausea And Vomiting  . Meperidine Nausea And Vomiting  . Morphine Nausea And Vomiting    Objective: There  were no vitals filed for this visit.  General: No acute distress, AAOx3  Right foot:Incision intact, dry scab. No erythema, no warmth, no drainage, no signs of infection noted, focal swelling to forefoot, pain with palpation to ankle>forefoot and guarding, rectus 1st toe, Capillary fill time <3 seconds in all digits, No calf pain.   Xrays- Hardware intact mild soft tissue swelling   Assessment and Plan:  Problem List Items Addressed This Visit    None    Visit Diagnoses    Hallux interphalangeus, acquired, right    -  Primary   Relevant Orders   DG Foot Complete Right (Completed)   Metatarsus primus  elevatus, right       Relevant Orders   DG Foot Complete Right (Completed)   S/P foot surgery, right       Relevant Medications   oxyCODONE-acetaminophen (PERCOCET) 10-325 MG tablet   Inflammatory pain of right heel       Acute right ankle pain          -Patient seen and evaluated -Xrays reviewed  -Applied unna boot on right -Patient return to [facility] until next week  -Advised patient to ice and elevate as necessary  -Rx Percocet  -Awaiting  PT, may come out of boot for PT -Will plan f/u to see how she is doing with PT  at next office visit.  Landis Martins, DPM

## 2018-08-24 ENCOUNTER — Ambulatory Visit (INDEPENDENT_AMBULATORY_CARE_PROVIDER_SITE_OTHER): Payer: Medicaid Other | Admitting: Sports Medicine

## 2018-08-24 ENCOUNTER — Encounter: Payer: Self-pay | Admitting: Sports Medicine

## 2018-08-24 DIAGNOSIS — Z9889 Other specified postprocedural states: Secondary | ICD-10-CM

## 2018-08-24 DIAGNOSIS — M79671 Pain in right foot: Secondary | ICD-10-CM

## 2018-08-24 DIAGNOSIS — M21271 Flexion deformity, right ankle and toes: Secondary | ICD-10-CM

## 2018-08-24 DIAGNOSIS — M2011 Hallux valgus (acquired), right foot: Secondary | ICD-10-CM

## 2018-08-24 DIAGNOSIS — M25571 Pain in right ankle and joints of right foot: Secondary | ICD-10-CM

## 2018-08-24 NOTE — Progress Notes (Signed)
Subjective: Hailey Patterson is a 62 y.o. female patient seen today in office for POV #9 (DOS 05/24/18), S/P R 1st ray fusion. Patient admits to pain to surgical site and most pain at heel that she is learning to deal with states that the Unna boot that was placed on her leg last visit gave her a rash and reports that she has been closely monitoring her incision to make sure she will not have any issues with healing.  Patient denies nausea, vomiting, fever, or chills, or any constitutional symptoms.   Patient Active Problem List   Diagnosis Date Noted  . Encounter for screening mammogram for malignant neoplasm of breast 03/03/2017  . Overweight (BMI 25.0-29.9) 08/17/2016  . Carpal tunnel syndrome 07/03/2015  . Abnormal breast finding 04/06/2015  . Itch of skin 04/06/2015  . General patient noncompliance 02/22/2015  . Bunion 09/08/2014  . Bilateral cataracts 09/07/2014  . Breast cancer, right breast ([place]) 09/07/2014  . Chest pain 07/25/2014  . Current tobacco use 10/14/2013  . 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 Tarsal tunnel syndrome 07/15/2013    Current Outpatient Medications on File Prior to Visit  Medication Sig Dispense Refill  . amoxicillin-clavulanate (AUGMENTIN) 875-125 MG tablet Take 1 tablet by mouth 2 (two) times daily. 28 tablet 0  . docusate sodium (COLACE) 100 MG capsule Take 100 mg by mouth 2 (two) times daily as needed for mild constipation.    . docusate sodium (COLACE) 100 MG capsule Take 100 mg by mouth 2 (two) times daily.    Marland Kitchen doxycycline (VIBRA-TABS) 100 MG tablet Take 1 tablet (100 mg total) by mouth 2 (two) times daily. 20 tablet 0  . DULoxetine (CYMBALTA) 60 MG capsule Take 1 capsule (60 mg total) by mouth daily. 30 capsule 2  . lidocaine (LIDODERM) 5 % Place 1 patch onto the skin daily. Remove & Discard patch within 12 hours or as directed by MD 30 patch 0  . meloxicam (MOBIC) 15 MG tablet Take 1 tablet (15 mg total) by mouth daily. 30 tablet 3  .  NONFORMULARY OR COMPOUNDED ITEM [facility]  Pain Cream Lidocaine 5% Apply 1-2 grams to affected area 3-4 times daily Qty. 120 gm 1 refills    . oxyCODONE-acetaminophen (PERCOCET) 10-325 MG tablet Take 1 tablet by mouth every 8 (eight) hours as needed for pain. 20 tablet 0  . pregabalin (LYRICA) 75 MG capsule Take 1 capsule (75 mg total) by mouth 2 (two) times daily. 60 capsule 1  . promethazine (PHENERGAN) 25 MG tablet Take 25 mg by mouth every 8 (eight) hours as needed for nausea or vomiting.    . promethazine (PHENERGAN) 25 MG tablet Take 25 mg by mouth every 8 (eight) hours as needed for nausea or vomiting (Take 1 tablet by mouth every eight hours as needed for nausea).    . promethazine (PHENERGAN) 25 MG tablet Take 1 tablet (25 mg total) by mouth every 8 (eight) hours as needed for nausea or vomiting. 20 tablet 0  . salicylic acid 17 % gel Apply topically daily. To callus area at bedtime and cover with bandaid 14 g 0   Current Facility-Administered Medications on File Prior to Visit  Medication Dose Route Frequency Provider Last Rate Last Dose  . triamcinolone acetonide (KENALOG) 10 MG/ML injection 10 mg  10 mg Other Once Landis Martins, DPM      . triamcinolone acetonide (KENALOG) 10 MG/ML injection 10 mg  10 mg Other Once Landis Martins, DPM  Allergies  Allergen Reactions  . Macrobid [Nitrofurantoin] Shortness Of Breath and Nausea And Vomiting  . Sulfa Antibiotics Nausea And Vomiting  . Codeine Nausea And Vomiting  . Meperidine Nausea And Vomiting  . Morphine Nausea And Vomiting    Objective: There were no vitals filed for this visit.  General: No acute distress, AAOx3  Right foot:Incision intact, dry scab with no signs of infection. No erythema, no warmth, no drainage, no signs of infection noted, focal swelling to forefoot, pain with palpation to ankle>forefoot and guarding, rectus 1st toe and deviated and contracted lesser digits, Capillary fill time <3  seconds in all digits, No calf pain.   Assessment and Plan:  Problem List Items Addressed This Visit    None    Visit Diagnoses    Hallux interphalangeus, acquired, right    -  Primary   Metatarsus primus elevatus, right       S/P foot surgery, right       Inflammatory pain of right heel       Acute right ankle pain          -Patient seen and evaluated -Ace wrap applied -Advised patient to continue with knee scooter and limited weightbearing may only weight-bear a maximum of 2 hours/day and must use a scooter anytime that she plans to be up on her foot more than 30 minutes at a time and was advised to rest for 30 minutes in between intervals of being up and moving around even with scooter to prevent excessive swelling -Continue with bone stimulator until told to discontinue -Advised patient to ice and elevate as necessary and encourage contrast baths -Awaiting  PT, may come out of boot for PT -Will plan f/u to see how she is doing with PT at next office visit.  Landis Martins, DPM

## 2018-08-30 ENCOUNTER — Encounter: Payer: Self-pay | Admitting: Physical Therapy

## 2018-08-30 ENCOUNTER — Ambulatory Visit: Payer: Medicaid Other | Attending: Sports Medicine | Admitting: Physical Therapy

## 2018-08-30 DIAGNOSIS — M6281 Muscle weakness (generalized): Secondary | ICD-10-CM | POA: Diagnosis present

## 2018-08-30 DIAGNOSIS — M79671 Pain in right foot: Secondary | ICD-10-CM | POA: Insufficient documentation

## 2018-08-30 DIAGNOSIS — R600 Localized edema: Secondary | ICD-10-CM

## 2018-08-30 DIAGNOSIS — R2689 Other abnormalities of gait and mobility: Secondary | ICD-10-CM | POA: Insufficient documentation

## 2018-08-30 NOTE — Therapy (Signed)
[facility] [address] [place], [place], [address] Phone: [phone]   Fax:  [phone]  Physical Therapy Evaluation  Patient Details  Name: Hailey Patterson MRN: [id number] Date of Birth: 1956-04-13 Referring Provider: Dr. Cannon Kettle   Encounter Date: 08/30/2018  PT End of Session - 08/30/18 1851    Visit Number  1    Number of Visits  4    Date for PT Re-Evaluation  09/27/18    PT Start Time  0320    PT Stop Time  0425    PT Time Calculation (min)  65 min    Activity Tolerance  Patient tolerated treatment well;Patient limited by pain    Behavior During Therapy  [facility] for tasks assessed/performed       Past Medical History:  Diagnosis Date  . Anxiety   . Breast cancer [facility])     Past Surgical History:  Procedure Laterality Date  . ABDOMINAL HYSTERECTOMY    . CESAREAN SECTION    . COLONOSCOPY N/A 05/01/2015   Procedure: COLONOSCOPY;  Surgeon: Josefine Class, MD;  Location: [facility] ENDOSCOPY;  Service: Endoscopy;  Laterality: N/A;  . EXOSTECTECTOMY TOE Right 09/29/2016   Procedure: EXOSTECTECTOMY TOE 3RD RIGHT TOE;  Surgeon: Landis Martins, DPM;  Location: [place];  Service: Podiatry;  Laterality: Right;  . FOOT SURGERY     bilateral feet  . KIDNEY SURGERY    . MASS EXCISION Right 09/29/2016   Procedure: EXCISION BENIGN LESION 1.0 CM SUB 3 RIGHT;  Surgeon: Landis Martins, DPM;  Location: [facility];  Service: Podiatry;  Laterality: Right;  . OSTECTOMY Right 09/29/2016   Procedure: BASE WEDGE OSTECTOMY FIRST RIGHT TOE;  Surgeon: Landis Martins, DPM;  Location: [place];  Service: Podiatry;  Laterality: Right;  . TONSILLECTOMY      There were no vitals filed for this visit.   Subjective Assessment - 08/30/18 1606    Subjective  Pt. is 62 y.o. female seeking therapy for pain management of R foot s/p surgery.  Pt. received extensive R foot procedures from January, 2013 to June,  2019 that has caused R foot to have a decrease in overall length, pain wiht movement, tenderness to palpation, decreased sensation, and decreased strength and ROM.  Pt. describes pain to be aching at all times, with sharp pain when walking at times.  Pt. is hopefuly that PT will benefit in returning to walking and having more "appealing" placement.    Pertinent History  significant surgical hx on R foot.    Limitations  Standing;Lifting;Walking;House hold activities    How long can you stand comfortably?  15 min    How long can you walk comfortably?  15 min    Patient Stated Goals  Be able to walk again without AD    Currently in Pain?  Yes    Pain Score  7     Pain Location  Foot    Pain Orientation  Right    Pain Descriptors / Indicators  Aching;Sharp;Stabbing    Pain Type  Surgical pain    Pain Radiating Towards  Pt. reports radiating sx at times up into knee.    Pain Onset  More than a month ago    Pain Frequency  Constant    Aggravating Factors   Standing/Sitting, Moving foot.    Pain Relieving Factors  Ice, Pain pills.    Effect of Pain on Daily Activities  Pt. is unable  to do majority of ADL's, unless she has scooter.    Multiple Pain Sites  No            SUBJECTIVE  Chief complaint:  Pain in R Foot Onset: January 2013, Most Recently June, 2019 Referring Dx: s/p foot surgery, right /closed displaced fracture of first metatarsal bone of right foot MD: Cannon Kettle Pain: 7/10 Present, 7/10 Best, 10/10 Worst Aggravating factors: Standing 15 minutes Easing factors: Pain medication,  24 hour pain behavior: Worse in morning. Ankle surgery: Yes Prior history of ankle injury or pain: Yes Pain quality: Burning, Stabbing, Sharp, Aching Waking pain: Yes Radiating pain: Yes, to knee Numbness/Tingling: Decreased sensation Imaging: Yes    ASSESSMENT Pt is a pleasant 62 year-old female referred for R foot pain.  Pt. currently demonstrates decreased sensation of R  foot in all areas,  specifically on lateral aspect of R foot, and great toe.  When ambulating, pt. demonstrates antalgic gait with boot donned on R LE with hip hike on the R side for foot clearance.  Pt. Demonstrates difficulty when climbing into truck.  Pt. Currently drives with L foot for gas/brake, and the R LE is rested on the console to the right side on a pillow.  Pt. Demonstrated decreased ROM and strength when asked to maneuver foot in supine position.  Pt. Tender to palpation along dorsal aspect of R foot along incision sites.  Incision sites are well healed with scarring.   PT examination reveals deficits in R ankle ROM, strength, and increase in pain.    Pt will benefit from PT services to address deficits in strength, mobility, and pain in order to return to full function at home with less ankle/foot pain.    OBJECTIVE  MUSCULOSKELETAL: Tremor: Absent Bulk: Normal Tone: Normal, no clonus Edema is noted in R foot, however is decreased in figure-8 measurement due to atrophy of muscle and overall size of R foot.  Lumbar/Hip/Knee Screen AROM: WFL and painless with overpressure in all planes   Gait Pt. Demonstrates antalgic gait with boot donned.  Pt. Has increased hip hike on R side for foot clearance.  Palpation Palpable pain on Dorsal aspect of R foot, tenderness noted in medial and lateral malleoli, along with all surfaces of foot/metatarsals.  Slight discomfort noted in gastrocs with squeeze and no joint line tenderness noted at R knee.  Strength R/L 3/5 Ankle Plantarflexion 3/5 Ankle Dorsiflexion 3/5 Ankle Inversion 3/5 Ankle Eversion MMT was not performed with resistance on R foot due to increase in pain with resistance.  Pt. Is able to perform slight movement against gravity with increase in pain.  AROM R/L 11*/47 Ankle Plantarflexion -4*/9 Ankle Dorsiflexion 8*/19 Ankle Inversion 11*/11 Ankle Eversion *Indicates Pain  PROM R/L 23*/56 Ankle Plantarflexion 6*/14 Ankle  Dorsiflexion 24*/24 Ankle Inversion 21*/24 Ankle Eversion *Indicates Pain         Objective measurements completed on examination: See above findings.      PT Education - 08/30/18 1850    Education Details  Pt. instructed on HEP    Person(s) Educated  Patient    Methods  Explanation;Demonstration;Tactile cues;Verbal cues;Handout    Comprehension  Verbalized understanding;Returned demonstration          PT Long Term Goals - 08/31/18 1413      PT LONG TERM GOAL #1   Title  Pt will be independent with HEP in order to decrease foot pain and increase strength in order to improve pain-free function at home and work.  Baseline  Pt. given basic ankle mobility exercises at initial eval.    Time  4    Period  Weeks    Status  New    Target Date  09/27/18      PT LONG TERM GOAL #2   Title  Pt. will complete FOTO and improve to 47 to improve daily functional mobility.    Baseline  9/10 FOTO: 30    Time  4    Period  Weeks    Status  New    Target Date  09/27/18      PT LONG TERM GOAL #3   Title  Pt will decrease worst pain as reported on NPRS by at least 3 points in order to demonstrate clinically significant reduction in ankle/foot pain.     Baseline  9/10 Worst Pain: 10/10    Time  4    Period  Weeks    Status  New    Target Date  09/27/18      PT LONG TERM GOAL #4   Title  Pt will increase strength of R ankle by at least 1/2 MMT grade in order to demonstrate improvement in strength and function     Baseline  08/31/18: Ankle Plantar R/L: 3/5, Dorsi: 3/5, Inver: 3/5, Ever: 3/5    Time  4    Period  Weeks    Status  New    Target Date  09/27/18         Plan - 08/31/18 1341    Clinical Impression Statement  Pt is a pleasant 62 year-old female referred for R foot pain.  Pt. currently demonstrates decreased sensation of R  foot in all areas, specifically on lateral aspect of R foot, and great toe.  When ambulating, pt. demonstrates antalgic gait with boot donned  on R LE with hip hike on the R side for foot clearance.  Pt. Demonstrates difficulty when climbing into truck.  Pt. Currently drives with L foot for gas/brake, and the R LE is rested on the console to the right side on a pillow.  Pt. Demonstrated decreased ROM and strength when asked to maneuver foot in supine position.  Pt. Tender to palpation along dorsal aspect of R foot along incision sites.  Incision sites are well healed with scarring.   PT examination reveals deficits in R ankle ROM, strength, and increase in pain.    Pt will benefit from PT services to address deficits in strength, mobility, and pain in order to return to full function at home with less ankle/foot pain.      Clinical Presentation  Evolving    Clinical Decision Making  Moderate    Rehab Potential  Fair    PT Frequency  2x / week    PT Duration  4 weeks    PT Treatment/Interventions  Aquatic Therapy;Cryotherapy;Electrical Stimulation;Moist Heat;Traction;Ultrasound;Parrafin;Contrast Bath;Gait training;Stair training;Functional mobility training;Therapeutic activities;Therapeutic exercise;Balance training;Neuromuscular re-education;Patient/family education;Orthotic Fit/Training;Manual techniques;Taping;Splinting;Dry needling;Passive range of motion;Scar mobilization;Compression bandaging;Joint Manipulations    PT Next Visit Plan  Assess gait with AD    PT Home Exercise Plan  see HEP    Consulted and Agree with Plan of Care  Patient       Patient will benefit from skilled therapeutic intervention in order to improve the following deficits and impairments:  Abnormal gait, Decreased endurance, Decreased skin integrity, Hypomobility, Impaired sensation, Increased edema, Decreased scar mobility, Decreased activity tolerance, Decreased strength, Pain, Decreased mobility, Difficulty walking, Improper body mechanics, Decreased range of motion, Impaired  flexibility  Visit Diagnosis: Pain in right foot  Muscle weakness  (generalized)  Localized edema  Other abnormalities of gait and mobility     Problem List Patient Active Problem List   Diagnosis Date Noted  . Encounter for screening mammogram for malignant neoplasm of breast 03/03/2017  . Overweight (BMI 25.0-29.9) 08/17/2016  . Carpal tunnel syndrome 07/03/2015  . Abnormal breast finding 04/06/2015  . Itch of skin 04/06/2015  . General patient noncompliance 02/22/2015  . Bunion 09/08/2014  . Bilateral cataracts 09/07/2014  . Breast cancer, right breast ([place]) 09/07/2014  . Chest pain 07/25/2014  . Current tobacco use 10/14/2013  . 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 Tarsal tunnel syndrome 07/15/2013   Pura Spice, PT, DPT # [id number] Gwenlyn Saran, SPT 08/31/2018, 2:20 PM  [facility] [address] [place], [place], [address] Phone: [phone]   Fax:  [phone]  Name: Hailey Patterson MRN: [id number] Date of Birth: Oct 12, 1956

## 2018-08-31 ENCOUNTER — Encounter: Payer: Self-pay | Admitting: Physical Therapy

## 2018-09-06 ENCOUNTER — Ambulatory Visit: Payer: Medicaid Other | Admitting: Physical Therapy

## 2018-09-06 ENCOUNTER — Encounter: Payer: Self-pay | Admitting: Physical Therapy

## 2018-09-06 DIAGNOSIS — M79671 Pain in right foot: Secondary | ICD-10-CM | POA: Diagnosis not present

## 2018-09-06 DIAGNOSIS — R2689 Other abnormalities of gait and mobility: Secondary | ICD-10-CM

## 2018-09-06 DIAGNOSIS — R600 Localized edema: Secondary | ICD-10-CM

## 2018-09-06 DIAGNOSIS — M6281 Muscle weakness (generalized): Secondary | ICD-10-CM

## 2018-09-07 ENCOUNTER — Encounter: Payer: Self-pay | Admitting: Physical Therapy

## 2018-09-07 NOTE — Therapy (Signed)
[facility] [address]. [place], [place], [address] Phone: [phone]   Fax:  [phone]  Physical Therapy Treatment  Patient Details  Name: Hailey Patterson MRN: [id number] Date of Birth: 1956/05/03 Referring Provider: Dr. Cannon Kettle   Encounter Date: 09/06/2018  PT End of Session - 09/07/18 0847    Visit Number  2    Number of Visits  4    Date for PT Re-Evaluation  09/27/18    PT Start Time  1027    PT Stop Time  1116    PT Time Calculation (min)  49 min    Activity Tolerance  Patient tolerated treatment well;Patient limited by pain    Behavior During Therapy  [facility] for tasks assessed/performed       Past Medical History:  Diagnosis Date  . Anxiety   . Breast cancer [facility])     Past Surgical History:  Procedure Laterality Date  . ABDOMINAL HYSTERECTOMY    . CESAREAN SECTION    . COLONOSCOPY N/A 05/01/2015   Procedure: COLONOSCOPY;  Surgeon: Josefine Class, MD;  Location: [facility] ENDOSCOPY;  Service: Endoscopy;  Laterality: N/A;  . EXOSTECTECTOMY TOE Right 09/29/2016   Procedure: EXOSTECTECTOMY TOE 3RD RIGHT TOE;  Surgeon: Landis Martins, DPM;  Location: [place];  Service: Podiatry;  Laterality: Right;  . FOOT SURGERY     bilateral feet  . KIDNEY SURGERY    . MASS EXCISION Right 09/29/2016   Procedure: EXCISION BENIGN LESION 1.0 CM SUB 3 RIGHT;  Surgeon: Landis Martins, DPM;  Location: [place];  Service: Podiatry;  Laterality: Right;  . OSTECTOMY Right 09/29/2016   Procedure: BASE WEDGE OSTECTOMY FIRST RIGHT TOE;  Surgeon: Landis Martins, DPM;  Location: [place];  Service: Podiatry;  Laterality: Right;  . TONSILLECTOMY      There were no vitals filed for this visit.  Subjective Assessment - 09/06/18 1100    Subjective  Pt. comes into therapy clinic without AD and severely antalgic gait.  Pt. reports she does not need therapy anymore as she is able to walk without AD and  is able to put shoes on.  Pt. does report tenderness with putting shoe on R foot.    Pertinent History  significant surgical hx on R foot.    Limitations  Standing;Lifting;Walking;House hold activities    How long can you stand comfortably?  15 min    How long can you walk comfortably?  15 min    Patient Stated Goals  Be able to walk again without AD    Currently in Pain?  Yes    Pain Score  7     Pain Location  Foot    Pain Orientation  Right    Pain Onset  More than a month ago    Pain Frequency  Constant         Treatment   Gait Training:  Ambulating with SPC around gym with verbal cueing for placement and sequencing  Pt. Offloads R foot with SPC and has less hip hike/antalgic gait with SPC  Pt. Consistently reports she does not want to be using a cane, so pt. Was educated on benefits of using AD to offload for proper weight displacement during ambulation.     Manual:  Supine STM to Dorsum of R foot/ankle Supine Generalized PROM/Stretching with slight overpressure to R LE Supine STM to Gastrocs/Soleus of R LE   There Ex:  Standing weight bearing  assessment for LE with scale - 70# without reported increase in pain with minA from UE for balance in // bars.   Standing lunges in // bars with minA from UE to pain tolerance to increase dorsiflexion         PT Long Term Goals - 08/31/18 1413      PT LONG TERM GOAL #1   Title  Pt will be independent with HEP in order to decrease foot pain and increase strength in order to improve pain-free function at home and work.     Baseline  Pt. given basic ankle mobility exercises at initial eval.    Time  4    Period  Weeks    Status  New    Target Date  09/27/18      PT LONG TERM GOAL #2   Title  Pt. will complete FOTO and improve to 47 to improve daily functional mobility.    Baseline  9/10 FOTO: 30    Time  4    Period  Weeks    Status  New    Target Date  09/27/18      PT LONG TERM GOAL #3   Title  Pt will  decrease worst pain as reported on NPRS by at least 3 points in order to demonstrate clinically significant reduction in ankle/foot pain.     Baseline  9/10 Worst Pain: 10/10    Time  4    Period  Weeks    Status  New    Target Date  09/27/18      PT LONG TERM GOAL #4   Title  Pt will increase strength of R ankle by at least 1/2 MMT grade in order to demonstrate improvement in strength and function     Baseline  08/31/18: Ankle Plantar R/L: 3/5, Dorsi: 3/5, Inver: 3/5, Ever: 3/5    Time  4    Period  Weeks    Status  New    Target Date  09/27/18            Plan - 09/07/18 0847    Clinical Impression Statement  Pt. was able to tolerate NuStep for 10 minutes with 2 rest breaks and shoes removed for pain tolerance.  Pt. is hypersentive on dorsal side of foot, specifically near great toe.  Pt. reluctant to attempt using cane for gait, however performed really well with weight displacement and walked with less antalgia.  Pt. will continue to benefit from gait training and manual therapy to increase joint ROM needed for proper ambulation.    Clinical Presentation  Evolving    Clinical Decision Making  Moderate    Rehab Potential  Fair    PT Frequency  1x / week    PT Duration  4 weeks    PT Treatment/Interventions  Aquatic Therapy;Cryotherapy;Electrical Stimulation;Moist Heat;Traction;Ultrasound;Parrafin;Contrast Bath;Gait training;Stair training;Functional mobility training;Therapeutic activities;Therapeutic exercise;Balance training;Neuromuscular re-education;Patient/family education;Orthotic Fit/Training;Manual techniques;Taping;Splinting;Dry needling;Passive range of motion;Scar mobilization;Compression bandaging;Joint Manipulations    PT Next Visit Plan  Assess gait with AD    PT Home Exercise Plan  see HEP    Consulted and Agree with Plan of Care  Patient       Patient will benefit from skilled therapeutic intervention in order to improve the following deficits and impairments:   Abnormal gait, Decreased endurance, Decreased skin integrity, Hypomobility, Impaired sensation, Increased edema, Decreased scar mobility, Decreased activity tolerance, Decreased strength, Pain, Decreased mobility, Difficulty walking, Improper body mechanics, Decreased range of motion, Impaired flexibility  Visit Diagnosis: Pain in right foot  Muscle weakness (generalized)  Localized edema  Other abnormalities of gait and mobility     Problem List Patient Active Problem List   Diagnosis Date Noted  . Encounter for screening mammogram for malignant neoplasm of breast 03/03/2017  . Overweight (BMI 25.0-29.9) 08/17/2016  . Carpal tunnel syndrome 07/03/2015  . Abnormal breast finding 04/06/2015  . Itch of skin 04/06/2015  . General patient noncompliance 02/22/2015  . Bunion 09/08/2014  . Bilateral cataracts 09/07/2014  . Breast cancer, right breast ([place]) 09/07/2014  . Chest pain 07/25/2014  . Current tobacco use 10/14/2013  . 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 Tarsal tunnel syndrome 07/15/2013   Pura Spice, PT, DPT # [id number] Gwenlyn Saran, SPT 09/07/2018, 11:25 AM  [facility] [address] [place], [place], [address] Phone: [phone]   Fax:  [phone]  Name: Hailey Patterson MRN: [id number] Date of Birth: 26-Oct-1956

## 2018-09-09 ENCOUNTER — Other Ambulatory Visit: Payer: Self-pay | Admitting: Family Medicine

## 2018-09-13 ENCOUNTER — Encounter: Payer: Self-pay | Admitting: Physical Therapy

## 2018-09-13 ENCOUNTER — Ambulatory Visit: Payer: Medicaid Other | Admitting: Physical Therapy

## 2018-09-13 DIAGNOSIS — M6281 Muscle weakness (generalized): Secondary | ICD-10-CM

## 2018-09-13 DIAGNOSIS — M79671 Pain in right foot: Secondary | ICD-10-CM | POA: Diagnosis not present

## 2018-09-13 DIAGNOSIS — R600 Localized edema: Secondary | ICD-10-CM

## 2018-09-13 DIAGNOSIS — R2689 Other abnormalities of gait and mobility: Secondary | ICD-10-CM

## 2018-09-13 NOTE — Therapy (Signed)
[place] [facility] [address] [place], [place], [address] Phone: [phone]   Fax:  [phone]  Physical Therapy Treatment  Patient Details  Name: Hailey Patterson MRN: [id number] Date of Birth: 17-Aug-1956 Referring Provider: Dr. Cannon Kettle   Encounter Date: 09/13/2018  PT End of Session - 09/13/18 1052    Visit Number  3    Number of Visits  4    Date for PT Re-Evaluation  09/27/18    PT Start Time  1021    PT Stop Time  1112    PT Time Calculation (min)  51 min    Activity Tolerance  Patient tolerated treatment well;Patient limited by pain    Behavior During Therapy  [facility] for tasks assessed/performed       Past Medical History:  Diagnosis Date  . Anxiety   . Breast cancer [facility])     Past Surgical History:  Procedure Laterality Date  . ABDOMINAL HYSTERECTOMY    . CESAREAN SECTION    . COLONOSCOPY N/A 05/01/2015   Procedure: COLONOSCOPY;  Surgeon: Josefine Class, MD;  Location: [facility] ENDOSCOPY;  Service: Endoscopy;  Laterality: N/A;  . EXOSTECTECTOMY TOE Right 09/29/2016   Procedure: EXOSTECTECTOMY TOE 3RD RIGHT TOE;  Surgeon: Landis Martins, DPM;  Location: [place];  Service: Podiatry;  Laterality: Right;  . FOOT SURGERY     bilateral feet  . KIDNEY SURGERY    . MASS EXCISION Right 09/29/2016   Procedure: EXCISION BENIGN LESION 1.0 CM SUB 3 RIGHT;  Surgeon: Landis Martins, DPM;  Location: [place];  Service: Podiatry;  Laterality: Right;  . OSTECTOMY Right 09/29/2016   Procedure: BASE WEDGE OSTECTOMY FIRST RIGHT TOE;  Surgeon: Landis Martins, DPM;  Location: [place];  Service: Podiatry;  Laterality: Right;  . TONSILLECTOMY      There were no vitals filed for this visit.  Subjective Assessment - 09/13/18 1049    Subjective  Pt. reports that she was busy working on the house over the weekend.  Pt. states she was on her feet for long periods of time with cleaning house and  running errands.  Pt. reports that her foot was swollen after cleaning home and has decreased since yesterday.      Pertinent History  significant surgical hx on R foot.    Limitations  Standing;Lifting;Walking;House hold activities    How long can you stand comfortably?  15 min    How long can you walk comfortably?  15 min    Patient Stated Goals  Be able to walk again without AD    Currently in Pain?  Yes    Pain Score  5     Pain Location  Foot    Pain Orientation  Right    Pain Onset  More than a month ago          Treatment   Manual:  Supine STM to Dorsum of R foot/ankle Supine Generalized PROM/Stretching with slight overpressure to R LE Supine STM to Gastrocs/Soleus of R LE   There Ex:  Supine 4-Way Ankle with YTB 2x15 each  Towel placed over foot for pain management on dorsum of R foot     PT Long Term Goals - 08/31/18 1413      PT LONG TERM GOAL #1   Title  Pt will be independent with HEP in order to decrease foot pain and increase strength in order to improve pain-free function at home and  work.     Baseline  Pt. given basic ankle mobility exercises at initial eval.    Time  4    Period  Weeks    Status  New    Target Date  09/27/18      PT LONG TERM GOAL #2   Title  Pt. will complete FOTO and improve to 47 to improve daily functional mobility.    Baseline  9/10 FOTO: 30    Time  4    Period  Weeks    Status  New    Target Date  09/27/18      PT LONG TERM GOAL #3   Title  Pt will decrease worst pain as reported on NPRS by at least 3 points in order to demonstrate clinically significant reduction in ankle/foot pain.     Baseline  9/10 Worst Pain: 10/10    Time  4    Period  Weeks    Status  New    Target Date  09/27/18      PT LONG TERM GOAL #4   Title  Pt will increase strength of R ankle by at least 1/2 MMT grade in order to demonstrate improvement in strength and function     Baseline  08/31/18: Ankle Plantar R/L: 3/5, Dorsi: 3/5, Inver:  3/5, Ever: 3/5    Time  4    Period  Weeks    Status  New    Target Date  09/27/18          Plan - 09/13/18 1306    Clinical Impression Statement  Pt. progressed to NuStep for 15 minutes with shoes donned and no rest breaks.  Pt. tolerated desensitization with towel today as well and encourged to perform technique at home in order to assist with pain levels in R foot.  Pt. responds well to STM of foot and is able to tolerate great pressure as therapy progresses.  Pt. walks with more normalized gait pattern when walking with cane in L hand, however pt. refuses to use AD.      Clinical Presentation  Evolving    Clinical Decision Making  Moderate    Rehab Potential  Fair    PT Frequency  1x / week    PT Duration  4 weeks    PT Treatment/Interventions  Aquatic Therapy;Cryotherapy;Electrical Stimulation;Moist Heat;Traction;Ultrasound;Parrafin;Contrast Bath;Gait training;Stair training;Functional mobility training;Therapeutic activities;Therapeutic exercise;Balance training;Neuromuscular re-education;Patient/family education;Orthotic Fit/Training;Manual techniques;Taping;Splinting;Dry needling;Passive range of motion;Scar mobilization;Compression bandaging;Joint Manipulations    PT Next Visit Plan  Progress gait with AD/ Reassess goals    PT Home Exercise Plan  see HEP    Consulted and Agree with Plan of Care  Patient       Patient will benefit from skilled therapeutic intervention in order to improve the following deficits and impairments:  Abnormal gait, Decreased endurance, Decreased skin integrity, Hypomobility, Impaired sensation, Increased edema, Decreased scar mobility, Decreased activity tolerance, Decreased strength, Pain, Decreased mobility, Difficulty walking, Improper body mechanics, Decreased range of motion, Impaired flexibility  Visit Diagnosis: Pain in right foot  Muscle weakness (generalized)  Localized edema  Other abnormalities of gait and mobility     Problem  List Patient Active Problem List   Diagnosis Date Noted  . Encounter for screening mammogram for malignant neoplasm of breast 03/03/2017  . Overweight (BMI 25.0-29.9) 08/17/2016  . Carpal tunnel syndrome 07/03/2015  . Abnormal breast finding 04/06/2015  . Itch of skin 04/06/2015  . General patient noncompliance 02/22/2015  . Bunion 09/08/2014  .  Bilateral cataracts 09/07/2014  . Breast cancer, right breast ([place]) 09/07/2014  . Chest pain 07/25/2014  . Current tobacco use 10/14/2013  . 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 Tarsal tunnel syndrome 07/15/2013   Pura Spice, PT, DPT # [id number] Gwenlyn Saran, SPT 09/13/2018, 4:03 PM  [place] [facility] [address] [place], [place], [address] Phone: [phone]   Fax:  [phone]  Name: JAILEN LUNG MRN: [id number] Date of Birth: Jun 05, 1956

## 2018-09-14 ENCOUNTER — Ambulatory Visit (INDEPENDENT_AMBULATORY_CARE_PROVIDER_SITE_OTHER): Payer: Medicaid Other | Admitting: Sports Medicine

## 2018-09-14 ENCOUNTER — Encounter: Payer: Self-pay | Admitting: Sports Medicine

## 2018-09-14 DIAGNOSIS — M25571 Pain in right ankle and joints of right foot: Secondary | ICD-10-CM

## 2018-09-14 DIAGNOSIS — Z9889 Other specified postprocedural states: Secondary | ICD-10-CM

## 2018-09-14 DIAGNOSIS — M79671 Pain in right foot: Secondary | ICD-10-CM

## 2018-09-14 DIAGNOSIS — M21271 Flexion deformity, right ankle and toes: Secondary | ICD-10-CM | POA: Diagnosis not present

## 2018-09-14 DIAGNOSIS — M2011 Hallux valgus (acquired), right foot: Secondary | ICD-10-CM

## 2018-09-14 MED ORDER — METHYLPREDNISOLONE 4 MG PO TBPK: ORAL_TABLET | ORAL | 0 refills | Status: DC

## 2018-09-14 NOTE — Progress Notes (Signed)
Subjective: Hailey Patterson is a 62 y.o. female patient seen today in office for POV #9 (DOS 05/24/18), S/P R 1st ray fusion. Patient admits to pain 5 out of 10 to surgical site and most pain at heel and at sites of her ankle and reports that physical therapy is wanting her to do more and to use a cane which she refuses.  Patient states that she is using her compression stocking but does not like how it makes her foot feel.  Patient denies nausea, vomiting, fever, or chills, or any constitutional symptoms.   Patient Active Problem List   Diagnosis Date Noted  . Encounter for screening mammogram for malignant neoplasm of breast 03/03/2017  . Overweight (BMI 25.0-29.9) 08/17/2016  . Carpal tunnel syndrome 07/03/2015  . Abnormal breast finding 04/06/2015  . Itch of skin 04/06/2015  . General patient noncompliance 02/22/2015  . Bunion 09/08/2014  . Bilateral cataracts 09/07/2014  . Breast cancer, right breast ([place]) 09/07/2014  . Chest pain 07/25/2014  . Current tobacco use 10/14/2013  . 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 Tarsal tunnel syndrome 07/15/2013    Current Outpatient Medications on File Prior to Visit  Medication Sig Dispense Refill  . amoxicillin-clavulanate (AUGMENTIN) 875-125 MG tablet Take 1 tablet by mouth 2 (two) times daily. 28 tablet 0  . docusate sodium (COLACE) 100 MG capsule Take 100 mg by mouth 2 (two) times daily as needed for mild constipation.    . docusate sodium (COLACE) 100 MG capsule Take 100 mg by mouth 2 (two) times daily.    Marland Kitchen doxycycline (VIBRA-TABS) 100 MG tablet Take 1 tablet (100 mg total) by mouth 2 (two) times daily. 20 tablet 0  . DULoxetine (CYMBALTA) 60 MG capsule Take 1 capsule (60 mg total) by mouth daily. 30 capsule 2  . lidocaine (LIDODERM) 5 % Place 1 patch onto the skin daily. Remove & Discard patch within 12 hours or as directed by MD 30 patch 0  . meloxicam (MOBIC) 15 MG tablet Take 1 tablet (15 mg total) by mouth daily. 30 tablet 3  .  NONFORMULARY OR COMPOUNDED ITEM [facility]  Pain Cream Lidocaine 5% Apply 1-2 grams to affected area 3-4 times daily Qty. 120 gm 1 refills    . oxyCODONE-acetaminophen (PERCOCET) 10-325 MG tablet Take 1 tablet by mouth every 8 (eight) hours as needed for pain. 20 tablet 0  . pregabalin (LYRICA) 75 MG capsule Take 1 capsule (75 mg total) by mouth 2 (two) times daily. 60 capsule 1  . promethazine (PHENERGAN) 25 MG tablet Take 25 mg by mouth every 8 (eight) hours as needed for nausea or vomiting.    . promethazine (PHENERGAN) 25 MG tablet Take 25 mg by mouth every 8 (eight) hours as needed for nausea or vomiting (Take 1 tablet by mouth every eight hours as needed for nausea).    . promethazine (PHENERGAN) 25 MG tablet Take 1 tablet (25 mg total) by mouth every 8 (eight) hours as needed for nausea or vomiting. 20 tablet 0  . salicylic acid 17 % gel Apply topically daily. To callus area at bedtime and cover with bandaid 14 g 0   Current Facility-Administered Medications on File Prior to Visit  Medication Dose Route Frequency Provider Last Rate Last Dose  . triamcinolone acetonide (KENALOG) 10 MG/ML injection 10 mg  10 mg Other Once Landis Martins, DPM      . triamcinolone acetonide (KENALOG) 10 MG/ML injection 10 mg  10 mg Other Once [place],  Quinta Eimer, DPM        Allergies  Allergen Reactions  . Macrobid [Nitrofurantoin] Shortness Of Breath and Nausea And Vomiting  . Sulfa Antibiotics Nausea And Vomiting  . Codeine Nausea And Vomiting  . Meperidine Nausea And Vomiting  . Morphine Nausea And Vomiting    Objective: There were no vitals filed for this visit.  General: No acute distress, AAOx3  Right foot:Incision site well-healed, no erythema, no warmth, no drainage, no signs of infection noted, focal swelling to forefoot, pain with palpation to ankle>forefoot and guarding, rectus 1st toe and deviated and contracted lesser digits that have slightly improved since swelling is going down,  Capillary fill time <3 seconds in all digits, No calf pain.   Assessment and Plan:  Problem List Items Addressed This Visit    None    Visit Diagnoses    Hallux interphalangeus, acquired, right    -  Primary   Metatarsus primus elevatus, right       S/P foot surgery, right       Inflammatory pain of right heel       Relevant Medications   methylPREDNISolone (MEDROL DOSEPAK) 4 MG TBPK tablet   Acute right ankle pain       Relevant Medications   methylPREDNISolone (MEDROL DOSEPAK) 4 MG TBPK tablet      -Patient seen and evaluated -Prescribe Medrol Dosepak to help with pain and inflammation that slowly progresses throughout the patient's day -Advised patient to work with physical therapy and to start with using a cane to help with stability in gait and that therapy may continue to progress her to help with strength and mobility around her foot and ankle that is weak and painful -Continue with bone stimulator until told to discontinue will determine next visit after x-ray she can discontinue -Advised patient to ice and elevate as necessary and encourage contrast baths and use of compression garments -Will plan f/u to see how she is doing with PT at next office visit and to perform x-rays next visit.  Landis Martins, DPM

## 2018-09-20 ENCOUNTER — Encounter: Payer: Self-pay | Admitting: Physical Therapy

## 2018-09-20 ENCOUNTER — Ambulatory Visit: Payer: Medicaid Other | Admitting: Physical Therapy

## 2018-09-20 DIAGNOSIS — M6281 Muscle weakness (generalized): Secondary | ICD-10-CM

## 2018-09-20 DIAGNOSIS — R600 Localized edema: Secondary | ICD-10-CM

## 2018-09-20 DIAGNOSIS — M79671 Pain in right foot: Secondary | ICD-10-CM

## 2018-09-20 DIAGNOSIS — R2689 Other abnormalities of gait and mobility: Secondary | ICD-10-CM

## 2018-09-20 NOTE — Therapy (Signed)
[place] [facility] [address] [place], [place], [address] Phone: [phone]   Fax:  [phone]  Physical Therapy Treatment  Patient Details  Name: Hailey Patterson MRN: [id number] Date of Birth: [birth year] Referring Provider (PT): Dr. Cannon Kettle   Encounter Date: 09/20/2018  PT End of Session - 09/20/18 1101    Visit Number  4    Number of Visits  12    Date for PT Re-Evaluation  10/18/18    PT Start Time  1341    PT Stop Time  1434    PT Time Calculation (min)  53 min    Activity Tolerance  Patient tolerated treatment well;Patient limited by pain    Behavior During Therapy  [facility] for tasks assessed/performed       Past Medical History:  Diagnosis Date  . Anxiety   . Breast cancer [facility])     Past Surgical History:  Procedure Laterality Date  . ABDOMINAL HYSTERECTOMY    . CESAREAN SECTION    . COLONOSCOPY N/A 05/01/2015   Procedure: COLONOSCOPY;  Surgeon: Josefine Class, MD;  Location: [facility] ENDOSCOPY;  Service: Endoscopy;  Laterality: N/A;  . EXOSTECTECTOMY TOE Right 09/29/2016   Procedure: EXOSTECTECTOMY TOE 3RD RIGHT TOE;  Surgeon: Landis Martins, DPM;  Location: [place];  Service: Podiatry;  Laterality: Right;  . FOOT SURGERY     bilateral feet  . KIDNEY SURGERY    . MASS EXCISION Right 09/29/2016   Procedure: EXCISION BENIGN LESION 1.0 CM SUB 3 RIGHT;  Surgeon: Landis Martins, DPM;  Location: [place];  Service: Podiatry;  Laterality: Right;  . OSTECTOMY Right 09/29/2016   Procedure: BASE WEDGE OSTECTOMY FIRST RIGHT TOE;  Surgeon: Landis Martins, DPM;  Location: [place];  Service: Podiatry;  Laterality: Right;  . TONSILLECTOMY      There were no vitals filed for this visit.  Subjective Assessment - 09/20/18 1305    Subjective  Pt. walks into clinic with no AD and decreased antalgic gait on the R foot.  Pt. reports that she was active over the weekend, however is having  difficulty staying on her feet long enough to finish tasks at home.    Pertinent History  significant surgical hx on R foot.    Limitations  Standing;Lifting;Walking;House hold activities    How long can you stand comfortably?  15 min    How long can you walk comfortably?  15 min    Patient Stated Goals  Be able to walk again without AD    Currently in Pain?  Yes    Pain Score  5     Pain Location  Foot    Pain Orientation  Right    Pain Descriptors / Indicators  Aching;Sharp;Stabbing    Pain Type  Surgical pain    Pain Onset  More than a month ago    Pain Frequency  Constant         [facility] PT Assessment - 09/20/18 0001      Assessment   Medical Diagnosis  s/p foot surgery, right/closed displaced fx of first metatarsal bone of right foot.    Referring Provider (PT)  Dr. Cannon Kettle    Onset Date/Surgical Date  05/22/18    Hand Dominance  Right    Prior Therapy  No      Prior Function   Level of Independence  Independent      Treatment   Manual:  Supine STM to  Dorsum of R foot/ankle Supine STM to Palmar of R foot/ankle  Pt. Noted sharp pain shooting up R LE to buttock with pressure point on heel. Supine Generalized PROM/Stretching with slight overpressure to R LE Supine STM to Gastrocs/Soleus of R LE  Goal assessment was performed and noted below.     PT Long Term Goals - 09/20/18 1051      PT LONG TERM GOAL #1   Title  Pt will be independent with HEP in order to decrease foot pain and increase strength in order to improve pain-free function at home and work.     Baseline  Pt. given basic ankle mobility exercises at initial eval.    Time  4    Period  Weeks    Status  Achieved    Target Date  09/27/18      PT LONG TERM GOAL #2   Title  Pt. will complete FOTO and improve to 47 to improve daily functional mobility.    Baseline  9/10 FOTO: 30    Time  4    Period  Weeks    Status  Partially Met    Target Date  10/18/18      PT LONG TERM GOAL #3   Title  Pt  will decrease worst pain as reported on NPRS by at least 3 points in order to demonstrate clinically significant reduction in ankle/foot pain.     Baseline  9/10 Worst Pain: 10/10;  9/30 Worst Pain: Just Hurts    Time  4    Period  Weeks    Status  Partially Met    Target Date  10/18/18      PT LONG TERM GOAL #4   Title  Pt will increase strength of R ankle by at least 1/2 MMT grade in order to demonstrate improvement in strength and function     Baseline  08/31/18: Ankle Plantar R/L: 3/5, Dorsi: 3/5, Inver: 3/5, Ever: 3/5;  09/20/18: Ankle Plantar R/L: 3+/5, Dorsi: 4/5, Inver: 3+/5, Ever: 3+/5    Time  4    Period  Weeks    Status  Partially Met    Target Date  10/18/18      PT LONG TERM GOAL #5   Title  Pt. will be able to walk 20 minutes without any increase in pain according to NPRS.    Baseline  Pt. reports pain starts after 10 minutes of walking.    Time  4    Period  Weeks    Status  New    Target Date  10/18/18          Plan - 09/20/18 1716    Clinical Impression Statement  Pt. continues to demonstrate increased inflammation of R ankle/foot, associated with pain in R ankle/foot.  Pt. is making progress towards goals specifically with gait pattern and ability to be upright on foot.  Pt. has continuous pain which is difficult for patient to describe and rate, however based on gait assessment, pt. is in significantly decreased pain while ambulating.  Pt. will continue to benefit from skilled therapy in order to increase weight tolerance on the R foot, along with decreased pain when walking around the community and at home.    Clinical Presentation  Evolving    Clinical Decision Making  Moderate    Rehab Potential  Fair    PT Frequency  2x / week    PT Duration  4 weeks    PT Treatment/Interventions  Aquatic  Therapy;Cryotherapy;Electrical Stimulation;Moist Heat;Traction;Ultrasound;Parrafin;Contrast Bath;Gait training;Stair training;Functional mobility training;Therapeutic  activities;Therapeutic exercise;Balance training;Neuromuscular re-education;Patient/family education;Orthotic Fit/Training;Manual techniques;Taping;Splinting;Dry needling;Passive range of motion;Scar mobilization;Compression bandaging;Joint Manipulations    PT Next Visit Plan  Progress gait with AD/ ankle stability    PT Home Exercise Plan  see HEP    Consulted and Agree with Plan of Care  Patient       Patient will benefit from skilled therapeutic intervention in order to improve the following deficits and impairments:  Abnormal gait, Decreased endurance, Decreased skin integrity, Hypomobility, Impaired sensation, Increased edema, Decreased scar mobility, Decreased activity tolerance, Decreased strength, Pain, Decreased mobility, Difficulty walking, Improper body mechanics, Decreased range of motion, Impaired flexibility  Visit Diagnosis: Pain in right foot  Muscle weakness (generalized)  Localized edema  Other abnormalities of gait and mobility     Problem List Patient Active Problem List   Diagnosis Date Noted  . Encounter for screening mammogram for malignant neoplasm of breast 03/03/2017  . Overweight (BMI 25.0-29.9) 08/17/2016  . Carpal tunnel syndrome 07/03/2015  . Abnormal breast finding 04/06/2015  . Itch of skin 04/06/2015  . General patient noncompliance 02/22/2015  . Bunion 09/08/2014  . Bilateral cataracts 09/07/2014  . Breast cancer, right breast ([place]) 09/07/2014  . Chest pain 07/25/2014  . Current tobacco use 10/14/2013  . 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 Tarsal tunnel syndrome 07/15/2013    Pura Spice, PT, DPT # [id number] [place], SPT 09/20/2018, 5:42 PM  [place] [facility] [address] [place], [place], [address] Phone: [phone]   Fax:  [phone]  Name: Hailey Patterson MRN: [id number] Date of Birth: 10/01/1956

## 2018-09-23 NOTE — Progress Notes (Signed)
DOS: 05-24-2018 Arthrodesis; fusion of 1st MTPJ and 1st TMTJ and 1st toe after removal of hardware on RT with application of cast; posterior splint   [facility]

## 2018-09-28 ENCOUNTER — Ambulatory Visit: Payer: Medicaid Other | Admitting: Physical Therapy

## 2018-09-30 ENCOUNTER — Encounter: Payer: Medicaid Other | Admitting: Physical Therapy

## 2018-10-05 ENCOUNTER — Encounter: Payer: Medicaid Other | Admitting: Physical Therapy

## 2018-10-05 ENCOUNTER — Ambulatory Visit (INDEPENDENT_AMBULATORY_CARE_PROVIDER_SITE_OTHER): Payer: Medicaid Other | Admitting: Sports Medicine

## 2018-10-05 ENCOUNTER — Ambulatory Visit (INDEPENDENT_AMBULATORY_CARE_PROVIDER_SITE_OTHER): Payer: Medicaid Other

## 2018-10-05 ENCOUNTER — Encounter: Payer: Self-pay | Admitting: Sports Medicine

## 2018-10-05 DIAGNOSIS — M2011 Hallux valgus (acquired), right foot: Secondary | ICD-10-CM | POA: Diagnosis not present

## 2018-10-05 DIAGNOSIS — M21271 Flexion deformity, right ankle and toes: Secondary | ICD-10-CM | POA: Diagnosis not present

## 2018-10-05 DIAGNOSIS — M79671 Pain in right foot: Secondary | ICD-10-CM

## 2018-10-05 DIAGNOSIS — M25571 Pain in right ankle and joints of right foot: Secondary | ICD-10-CM

## 2018-10-05 DIAGNOSIS — Z9889 Other specified postprocedural states: Secondary | ICD-10-CM

## 2018-10-05 NOTE — Progress Notes (Signed)
Subjective: Hailey Patterson is a 62 y.o. female patient seen today in office for POV #10 (DOS 05/24/18), S/P R 1st ray fusion. Patient admits to some pain to top of her right foot and most pain at her ankle on right with swelling. 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 Patient reports that she feels like PT isn't helping. No other issues noted.   Patient Active Problem List   Diagnosis Date Noted  . Encounter for screening mammogram for malignant neoplasm of breast 03/03/2017  . Overweight (BMI 25.0-29.9) 08/17/2016  . Carpal tunnel syndrome 07/03/2015  . Abnormal breast finding 04/06/2015  . Itch of skin 04/06/2015  . General patient noncompliance 02/22/2015  . Bunion 09/08/2014  . Bilateral cataracts 09/07/2014  . Breast cancer, right breast ([place]) 09/07/2014  . Chest pain 07/25/2014  . Current tobacco use 10/14/2013  . 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 Tarsal tunnel syndrome 07/15/2013    Current Outpatient Medications on File Prior to Visit  Medication Sig Dispense Refill  . amoxicillin-clavulanate (AUGMENTIN) 875-125 MG tablet Take 1 tablet by mouth 2 (two) times daily. 28 tablet 0  . docusate sodium (COLACE) 100 MG capsule Take 100 mg by mouth 2 (two) times daily as needed for mild constipation.    . docusate sodium (COLACE) 100 MG capsule Take 100 mg by mouth 2 (two) times daily.    Marland Kitchen doxycycline (VIBRA-TABS) 100 MG tablet Take 1 tablet (100 mg total) by mouth 2 (two) times daily. 20 tablet 0  . DULoxetine (CYMBALTA) 60 MG capsule Take 1 capsule (60 mg total) by mouth daily. 30 capsule 2  . lidocaine (LIDODERM) 5 % Place 1 patch onto the skin daily. Remove & Discard patch within 12 hours or as directed by MD 30 patch 0  . meloxicam (MOBIC) 15 MG tablet Take 1 tablet (15 mg total) by mouth daily. 30 tablet 3  . methylPREDNISolone (MEDROL DOSEPAK) 4 MG TBPK tablet  Take as directed 21 tablet 0  . NONFORMULARY OR COMPOUNDED ITEM [facility]  Pain Cream Lidocaine 5% Apply 1-2 grams to affected area 3-4 times daily Qty. 120 gm 1 refills    . oxyCODONE-acetaminophen (PERCOCET) 10-325 MG tablet Take 1 tablet by mouth every 8 (eight) hours as needed for pain. 20 tablet 0  . pregabalin (LYRICA) 75 MG capsule Take 1 capsule (75 mg total) by mouth 2 (two) times daily. 60 capsule 1  . promethazine (PHENERGAN) 25 MG tablet Take 25 mg by mouth every 8 (eight) hours as needed for nausea or vomiting.    . promethazine (PHENERGAN) 25 MG tablet Take 25 mg by mouth every 8 (eight) hours as needed for nausea or vomiting (Take 1 tablet by mouth every eight hours as needed for nausea).    . promethazine (PHENERGAN) 25 MG tablet Take 1 tablet (25 mg total) by mouth every 8 (eight) hours as needed for nausea or vomiting. 20 tablet 0  . salicylic acid 17 % gel Apply topically daily. To callus area at bedtime and cover with bandaid 14 g 0   Current Facility-Administered Medications on File Prior to Visit  Medication Dose Route Frequency Provider Last Rate Last Dose  . triamcinolone acetonide (KENALOG) 10 MG/ML injection 10 mg  10 mg Other Once Landis Martins, DPM      . triamcinolone acetonide (KENALOG) 10 MG/ML injection 10 mg  10 mg Other Once Landis Martins, DPM        Allergies  Allergen Reactions  . Macrobid [Nitrofurantoin] Shortness Of Breath and Nausea And Vomiting  . Sulfa Antibiotics Nausea And Vomiting  . Codeine Nausea And Vomiting  . Meperidine Nausea And Vomiting  . Morphine Nausea And Vomiting    Objective: There were no vitals filed for this visit.  General: No acute distress, AAOx3  Right foot:Incision site well-healed, no erythema, no warmth, no drainage, no signs of infection noted, focal swelling to forefoot, pain with palpation to ankle>forefoot and guarding, rectus 1st toe and deviated and contracted lesser digits that have slightly  improved since swelling is going down and reducible but there is scar contracture noted, Capillary fill time <3 seconds in all digits, No calf pain.   Xrays hardware intact 1st ray  Assessment and Plan:  Problem List Items Addressed This Visit    None    Visit Diagnoses    Hallux interphalangeus, acquired, right    -  Primary   Relevant Orders   DG Foot Complete Right (Completed)   Metatarsus primus elevatus, right       S/P foot surgery, right       Inflammatory pain of right heel       Acute right ankle pain          -Patient seen and evaluated -Xrays revewed  -Advised patient may d/c PT -Recommend good supportive shoes and patient to see Hailey Patterson to she if her orthotics can be modified; discussed with Hailey Patterson who recommends she wear her orthotics and let us know whats hurting; advised a deeper shoe to put the devices in  -Continue with activities to tolerance  -Will plan f/u to see how she is doing with orthotics at next visit.  Landis Martins, DPM

## 2018-10-07 ENCOUNTER — Ambulatory Visit: Payer: Medicaid Other | Admitting: Physical Therapy

## 2018-10-12 ENCOUNTER — Encounter: Payer: Medicaid Other | Admitting: Physical Therapy

## 2018-10-14 ENCOUNTER — Encounter: Payer: Medicaid Other | Admitting: Physical Therapy

## 2018-10-19 ENCOUNTER — Encounter: Payer: Medicaid Other | Admitting: Physical Therapy

## 2018-10-21 ENCOUNTER — Encounter: Payer: Medicaid Other | Admitting: Physical Therapy

## 2020-11-12 ENCOUNTER — Other Ambulatory Visit: Payer: Self-pay

## 2020-11-12 ENCOUNTER — Emergency Department: Payer: Medicare Other

## 2020-11-12 ENCOUNTER — Encounter: Payer: Self-pay | Admitting: Emergency Medicine

## 2020-11-12 ENCOUNTER — Emergency Department
Admission: EM | Admit: 2020-11-12 | Discharge: 2020-11-12 | Disposition: A | Discharge status: Home / Self Care | Payer: Medicare Other | Attending: Emergency Medicine | Admitting: Emergency Medicine

## 2020-11-12 DIAGNOSIS — R079 Chest pain, unspecified: Secondary | ICD-10-CM | POA: Insufficient documentation

## 2020-11-12 DIAGNOSIS — R6884 Jaw pain: Secondary | ICD-10-CM | POA: Diagnosis not present

## 2020-11-12 DIAGNOSIS — F17210 Nicotine dependence, cigarettes, uncomplicated: Secondary | ICD-10-CM | POA: Diagnosis not present

## 2020-11-12 DIAGNOSIS — M79605 Pain in left leg: Secondary | ICD-10-CM | POA: Diagnosis not present

## 2020-11-12 DIAGNOSIS — R112 Nausea with vomiting, unspecified: Secondary | ICD-10-CM | POA: Diagnosis not present

## 2020-11-12 DIAGNOSIS — Z853 Personal history of malignant neoplasm of breast: Secondary | ICD-10-CM | POA: Insufficient documentation

## 2020-11-12 LAB — BASIC METABOLIC PANEL
Anion gap: 6 (ref 5–15)
BUN: 16 mg/dL (ref 8–23)
CO2: 27 mmol/L (ref 22–32)
Calcium: 9.2 mg/dL (ref 8.9–10.3)
Chloride: 106 mmol/L (ref 98–111)
Creatinine, Ser: 0.86 mg/dL (ref 0.44–1.00)
GFR, Estimated: >60 mL/min (ref >60)
Glucose, Bld: 97 mg/dL (ref 70–99)
Potassium: 3.7 mmol/L (ref 3.5–5.1)
Sodium: 139 mmol/L (ref 135–145)

## 2020-11-12 LAB — CBC
HCT: 43.3 % (ref 36.0–46.0)
Hemoglobin: 14.5 g/dL (ref 12.0–15.0)
MCH: 32.1 pg (ref 26.0–34.0)
MCHC: 33.5 g/dL (ref 30.0–36.0)
MCV: 95.8 fL (ref 80.0–100.0)
Platelets: 248 10*3/uL (ref 150–400)
RBC: 4.52 MIL/uL (ref 3.87–5.11)
RDW: 13.4 % (ref 11.5–15.5)
WBC: 7.1 10*3/uL (ref 4.0–10.5)
nRBC: 0 % (ref 0.0–0.2)

## 2020-11-12 LAB — TROPONIN I (HIGH SENSITIVITY)
Troponin I (High Sensitivity): 7 ng/L (ref <18)
Troponin I (High Sensitivity): 7 ng/L (ref <18)

## 2020-11-12 MED ORDER — FAMOTIDINE 40 MG PO TABS: 40.0000 mg | ORAL_TABLET | Freq: Every evening | ORAL | 1 refills | Status: DC

## 2020-11-12 MED ORDER — FAMOTIDINE 20 MG PO TABS: 20.0000 mg | ORAL_TABLET | Freq: Every evening | ORAL | 1 refills | Status: DC

## 2020-11-12 NOTE — Discharge Instructions (Addendum)
Please seek medical attention for any high fevers, chest pain, shortness of breath, change in behavior, persistent vomiting, bloody stool or any other new or concerning symptoms.  

## 2020-11-12 NOTE — ED Triage Notes (Signed)
FIRST NURSE: Pt to ER via EMS states 1 hours PTA had sudden onset of midsternal chest pain that radiated to both sides of face and then subsided.  States after that began to have dizziness which continues.  EMS EKG unremarkable.

## 2020-11-12 NOTE — ED Provider Notes (Signed)
[facility] Emergency Department Provider Note   ____________________________________________   I have reviewed the triage vital signs and the nursing notes.   HISTORY  Chief Complaint Chest Pain   History limited by: Not Limited   HPI Hailey Patterson is a 64 y.o. female who presents to the emergency department today because of an episode of chest pain. Patient states that it started while she was driving her car. Located in her mid lower chest it lasted roughly 1 hour. It did radiate up to her jaw and was accompanied by some nausea and vomiting. The patient denies any shortness of breath or diaphoresis. Patient denies similar pain in the past. Denies any unusual exertion or activity today. Denies any fevers.  Was seen at outside ER yesterday for left leg pain.  She states she has a long history of right leg issues however started developing some left leg pain.  She describes it as shooting down the backside of her leg.   Records reviewed. Per medical record review patient has a history of ER visit yesterday for left leg pain. Work up and age adjusted d-dimer negative.   Past Medical History:  Diagnosis Date  . Anxiety   . Breast cancer [facility])     Patient Active Problem List   Diagnosis Date Noted  . Encounter for screening mammogram for malignant neoplasm of breast 03/03/2017  . Overweight (BMI 25.0-29.9) 08/17/2016  . Carpal tunnel syndrome 07/03/2015  . Abnormal breast finding 04/06/2015  . Itch of skin 04/06/2015  . General patient noncompliance 02/22/2015  . Bunion 09/08/2014  . Bilateral cataracts 09/07/2014  . Breast cancer, right breast ([place]) 09/07/2014  . Chest pain 07/25/2014  . Current tobacco use 10/14/2013  . 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 Tarsal tunnel syndrome 07/15/2013    Past Surgical History:  Procedure Laterality Date  . ABDOMINAL HYSTERECTOMY    . CESAREAN SECTION    . COLONOSCOPY N/A 05/01/2015   Procedure:  COLONOSCOPY;  Surgeon: Josefine Class, MD;  Location: [facility] ENDOSCOPY;  Service: Endoscopy;  Laterality: N/A;  . EXOSTECTECTOMY TOE Right 09/29/2016   Procedure: EXOSTECTECTOMY TOE 3RD RIGHT TOE;  Surgeon: Landis Martins, DPM;  Location: [place];  Service: Podiatry;  Laterality: Right;  . FOOT SURGERY     bilateral feet  . KIDNEY SURGERY    . MASS EXCISION Right 09/29/2016   Procedure: EXCISION BENIGN LESION 1.0 CM SUB 3 RIGHT;  Surgeon: Landis Martins, DPM;  Location: [place];  Service: Podiatry;  Laterality: Right;  . OSTECTOMY Right 09/29/2016   Procedure: BASE WEDGE OSTECTOMY FIRST RIGHT TOE;  Surgeon: Landis Martins, DPM;  Location: [place];  Service: Podiatry;  Laterality: Right;  . TONSILLECTOMY      Prior to Admission medications   Medication Sig Start Date End Date Taking? Authorizing Provider  amoxicillin-clavulanate (AUGMENTIN) 875-125 MG tablet Take 1 tablet by mouth 2 (two) times daily. 12/25/17   Landis Martins, DPM  docusate sodium (COLACE) 100 MG capsule Take 100 mg by mouth 2 (two) times daily as needed for mild constipation. 05/25/17   Landis Martins, DPM  docusate sodium (COLACE) 100 MG capsule Take 100 mg by mouth 2 (two) times daily. 01/11/18   Landis Martins, DPM  doxycycline (VIBRA-TABS) 100 MG tablet Take 1 tablet (100 mg total) by mouth 2 (two) times daily. 01/19/18   Landis Martins, DPM  DULoxetine (CYMBALTA) 60 MG capsule Take 1 capsule (60 mg total) by mouth  daily. 02/16/18   Landis Martins, DPM  lidocaine (LIDODERM) 5 % Place 1 patch onto the skin daily. Remove & Discard patch within 12 hours or as directed by MD 06/09/17   Landis Martins, DPM  meloxicam (MOBIC) 15 MG tablet Take 1 tablet (15 mg total) by mouth daily. 04/20/18   Edrick Kins, DPM  methylPREDNISolone (MEDROL DOSEPAK) 4 MG TBPK tablet Take as directed 09/14/18   Landis Martins, DPM  NONFORMULARY OR COMPOUNDED ITEM [facility]  Pain  Cream Lidocaine 5% Apply 1-2 grams to affected area 3-4 times daily Qty. 120 gm 1 refills    [provider]  oxyCODONE-acetaminophen (PERCOCET) 10-325 MG tablet Take 1 tablet by mouth every 8 (eight) hours as needed for pain. 08/17/18   Landis Martins, DPM  pregabalin (LYRICA) 75 MG capsule Take 1 capsule (75 mg total) by mouth 2 (two) times daily. 08/11/17   Landis Martins, DPM  promethazine (PHENERGAN) 25 MG tablet Take 25 mg by mouth every 8 (eight) hours as needed for nausea or vomiting. 05/25/17   Landis Martins, DPM  promethazine (PHENERGAN) 25 MG tablet Take 25 mg by mouth every 8 (eight) hours as needed for nausea or vomiting (Take 1 tablet by mouth every eight hours as needed for nausea). 01/11/18   Landis Martins, DPM  promethazine (PHENERGAN) 25 MG tablet Take 1 tablet (25 mg total) by mouth every 8 (eight) hours as needed for nausea or vomiting. 06/01/18   Landis Martins, DPM  salicylic acid 17 % gel Apply topically daily. To callus area at bedtime and cover with bandaid 09/15/17   Landis Martins, DPM    Allergies Macrobid [nitrofurantoin], Sulfa antibiotics, Codeine, Meperidine, and Morphine  Family History  Problem Relation Age of Onset  . Bladder Cancer Neg Hx   . Kidney cancer Neg Hx     Social History Social History   Tobacco Use  . Smoking status: Heavy Tobacco Smoker    Packs/day: 1.00    Years: 52.00    Pack years: 52.00    Types: Cigarettes  . Smokeless tobacco: Never Used  Substance Use Topics  . Alcohol use: No  . Drug use: No    Review of Systems Constitutional: No fever/chills Eyes: No visual changes. ENT: No sore throat. Cardiovascular: Positive for chest pain. Respiratory: Denies shortness of breath. Gastrointestinal: No abdominal pain.  No nausea, no vomiting.  No diarrhea.   Genitourinary: Negative for dysuria. Musculoskeletal: Positive for left leg pain. Skin: Negative for rash. Neurological: Negative for headaches, focal weakness  or numbness.  ____________________________________________   PHYSICAL EXAM:  VITAL SIGNS: ED Triage Vitals  Enc Vitals Group     BP 11/12/20 1434 (!) 166/80     Pulse Rate 11/12/20 1434 95     Resp 11/12/20 1434 16     Temp 11/12/20 1723 98.2 F (36.8 C)     Temp Source 11/12/20 1723 Oral     SpO2 11/12/20 1434 100 %     Weight 11/12/20 1436 150 lb (68 kg)     Height 11/12/20 1436 5\' 1"  (1.549 m)     Head Circumference --      Peak Flow --      Pain Score 11/12/20 1435 1   Constitutional: Alert and oriented.  Eyes: Conjunctivae are normal.  ENT      Head: Normocephalic and atraumatic.      Nose: No congestion/rhinnorhea.      Mouth/Throat: Mucous membranes are moist.      Neck:  No stridor. Hematological/Lymphatic/Immunilogical: No cervical lymphadenopathy. Cardiovascular: Normal rate, regular rhythm.  No murmurs, rubs, or gallops.  Respiratory: Normal respiratory effort without tachypnea nor retractions. Breath sounds are clear and equal bilaterally. No wheezes/rales/rhonchi. Gastrointestinal: Soft and non tender. No rebound. No guarding.  Genitourinary: Deferred Musculoskeletal: Normal range of motion in all extremities. No lower extremity edema. Neurologic:  Normal speech and language. No gross focal neurologic deficits are appreciated.  Skin:  Skin is warm, dry and intact. No rash noted. Psychiatric: Mood and affect are normal. Speech and behavior are normal. Patient exhibits appropriate insight and judgment.  ____________________________________________    LABS (pertinent positives/negatives)  Trop hs 7 x 2 BMP wnl CBC wbc 7.1, hgb 14.5, plt 248  ____________________________________________   EKG  I, Nance Pear, attending physician, personally viewed and interpreted this EKG  EKG Time: 1438 Rate: 88 Rhythm: normal sinus rhythm Axis: left axis deviation Intervals: qtc 440 QRS: narrow ST changes: no st elevation Impression: abnormal  ekg ____________________________________________    RADIOLOGY  CXR No active cardiopulmonary disease  ____________________________________________   PROCEDURES  Procedures  ____________________________________________   INITIAL IMPRESSION / ASSESSMENT AND PLAN / ED COURSE  Pertinent labs & imaging results that were available during my care of the patient were reviewed by me and considered in my medical decision making (see chart for details).   Patient presented to the emergency department today because of concerns for chest pain.  Chest pain had resolved at the time my examination.  Lungs and heart without concerning findings.  No peripheral edema.  Patient's work-up reassuring with troponin negative x2.  Chest x-ray and EKG without acute concerning findings.  Patient had been complaining of some left leg pain.  The way she describes it with pain shooting down the back of her leg is reminiscent of sciatica.  At this point I doubt blood clot given negative age-adjusted D-dimer obtained yesterday and lack of swelling.  Additionally do not think patient's chest pain would represent PE.  Also would doubt dissection.  This point did discuss with patient possibility of esophageal issue.  Did discuss return precautions.  ____________________________________________   FINAL CLINICAL IMPRESSION(S) / ED DIAGNOSES  Final diagnoses:  Nonspecific chest pain     Note: This dictation was prepared with Dragon dictation. Any transcriptional errors that result from this process are unintentional     Nance Pear, MD 11/12/20 2000

## 2020-11-12 NOTE — ED Triage Notes (Signed)
Says chest pain --mid lower chest and then it went into jaw.  Says it lasted about an hour and a half.  Has never had t his before.

## 2020-11-12 NOTE — ED Notes (Signed)
Patient complains of CP pta but none at this time. Patient also has back pain and knee pain from a previous injury.

## 2020-11-12 NOTE — ED Triage Notes (Signed)
Says she went to [facility] last night for leg pain and they mentioned ultrasound, but they did not have one.  No injury.

## 2021-10-31 ENCOUNTER — Encounter: Payer: Self-pay | Admitting: Internal Medicine

## 2021-10-31 ENCOUNTER — Ambulatory Visit (INDEPENDENT_AMBULATORY_CARE_PROVIDER_SITE_OTHER): Payer: Medicare Other | Admitting: Internal Medicine

## 2021-10-31 ENCOUNTER — Other Ambulatory Visit: Payer: Self-pay

## 2021-10-31 VITALS — BP 176/102 | HR 85 | Temp 98.3°F | Ht 61.02 in | Wt 163.4 lb

## 2021-10-31 DIAGNOSIS — Z720 Tobacco use: Secondary | ICD-10-CM | POA: Insufficient documentation

## 2021-10-31 DIAGNOSIS — R030 Elevated blood-pressure reading, without diagnosis of hypertension: Secondary | ICD-10-CM | POA: Insufficient documentation

## 2021-10-31 DIAGNOSIS — M25571 Pain in right ankle and joints of right foot: Secondary | ICD-10-CM

## 2021-10-31 DIAGNOSIS — Z1231 Encounter for screening mammogram for malignant neoplasm of breast: Secondary | ICD-10-CM | POA: Diagnosis not present

## 2021-10-31 DIAGNOSIS — N189 Chronic kidney disease, unspecified: Secondary | ICD-10-CM | POA: Diagnosis not present

## 2021-10-31 DIAGNOSIS — Z13820 Encounter for screening for osteoporosis: Secondary | ICD-10-CM | POA: Insufficient documentation

## 2021-10-31 DIAGNOSIS — G8929 Other chronic pain: Secondary | ICD-10-CM

## 2021-10-31 NOTE — Progress Notes (Signed)
BP (!) (P) 164/92   Pulse (P) 81   Temp (P) 98.3 F (36.8 C) (Oral)   Ht (P) 5' 1.02" (1.55 m)   Wt (P) 163 lb 6.4 oz (74.1 kg)   SpO2 (P) 98%   BMI (P) 30.85 kg/m    Subjective:    Patient ID: Hailey Patterson, female    DOB: 04-14-1956, 65 y.o.   MRN: [id number]  Chief Complaint  Patient presents with   New Patient (Initial Visit)    Patient states that she has been feeling lightheaded. It comes and goes. Not feeling lightheaded at this time     HPI: Hailey Patterson is a 65 y.o. female  Pt is  new to the practice, was seen by 40 yrs ago by the last pcp here. Her last pcp was last seen 3 yrs ago.  Has a ho tobacco abuse down to 1 pack in 3 days was smoking a ppd x about 40 yrs at least. Quit while she was preg.  Right foot surg in the past - had a  " growth in nerves around the heeel " was seeing orhto @ [place]. 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 Seen [facility] here for such  Was seen by [facility] had 3 fractures in 2019 and in 2020 went in and were supposed to take rods out  Last year had ankle swelling had to go back to [facility] - one of the screw heads had broken out nad stll had # and ankle was #  Cannot walk mch and cannot exercise has pain in the foot sec to such.  More walking she does she hurts.  Seen ortho in [place] finally, told pt   Breast cancer 2002 - right s/p lumpectomy and had chemo and radiation - so far all mammograms negative - last 3 yrs ago.   Hypertension This is a chronic (has been light headed on an off) problem. The current episode started more than 1 year ago. The problem has been rapidly worsening since onset. The problem is uncontrolled. Pertinent negatives include no anxiety, blurred vision, chest pain, headaches, malaise/fatigue, neck pain, orthopnea, palpitations, peripheral edema, PND or shortness of breath. Risk factors for coronary artery disease include smoking/tobacco exposure.   Chief Complaint   Patient presents with   New Patient (Initial Visit)    Patient states that she has been feeling lightheaded. It comes and goes. Not feeling lightheaded at this time     Relevant past medical, surgical, family and social history reviewed and updated as indicated. Interim medical history since our last visit reviewed. Allergies and medications reviewed and updated.  Review of Systems  Constitutional:  Negative for malaise/fatigue.  Eyes:  Negative for blurred vision.  Respiratory:  Negative for shortness of breath.   Cardiovascular:  Negative for chest pain, palpitations, orthopnea and PND.  Musculoskeletal:  Negative for neck pain.  Neurological:  Negative for headaches.   Per HPI unless specifically indicated above     Objective:    BP (!) (P) 164/92   Pulse (P) 81   Temp (P) 98.3 F (36.8 C) (Oral)   Ht (P) 5' 1.02" (1.55 m)   Wt (P) 163 lb 6.4 oz (74.1 kg)   SpO2 (P) 98%   BMI (P) 30.85 kg/m   Wt Readings from Last 3 Encounters:  10/31/21 (P) 163 lb 6.4 oz (74.1 kg)  11/12/20 150 lb (68 kg)  01/05/17 150  lb (68 kg)    Physical Exam Vitals and nursing note reviewed.  Constitutional:      General: She is not in acute distress.    Appearance: Normal appearance. She is not ill-appearing or diaphoretic.  HENT:     Head: Normocephalic and atraumatic.     Right Ear: Tympanic membrane and external ear normal. There is no impacted cerumen.     Left Ear: External ear normal.     Nose: No congestion or rhinorrhea.     Mouth/Throat:     Pharynx: No oropharyngeal exudate or posterior oropharyngeal erythema.  Eyes:     Conjunctiva/sclera: Conjunctivae normal.     Pupils: Pupils are equal, round, and reactive to light.  Cardiovascular:     Rate and Rhythm: Normal rate and regular rhythm.     Heart sounds: No murmur heard.   No friction rub. No gallop.  Pulmonary:     Effort: No respiratory distress.     Breath sounds: No stridor. No wheezing or rhonchi.  Chest:      Chest wall: No tenderness.  Abdominal:     General: Abdomen is flat. Bowel sounds are normal. There is no distension.     Palpations: Abdomen is soft. There is no mass.     Tenderness: There is no abdominal tenderness. There is no guarding.  Musculoskeletal:        General: No swelling or deformity.     Cervical back: Normal range of motion and neck supple. No rigidity or tenderness.     Right lower leg: No edema.     Left lower leg: No edema.  Skin:    General: Skin is warm and dry.     Coloration: Skin is not jaundiced.     Findings: No erythema.  Neurological:     Mental Status: She is alert and oriented to person, place, and time. Mental status is at baseline.  Psychiatric:        Mood and Affect: Mood normal.        Behavior: Behavior normal.        Thought Content: Thought content normal.        Judgment: Judgment normal.    Results for orders placed or performed during the hospital encounter of 66/06/00  Basic metabolic panel  Result Value Ref Range   Sodium 139 135 - 145 mmol/L   Potassium 3.7 3.5 - 5.1 mmol/L   Chloride 106 98 - 111 mmol/L   CO2 27 22 - 32 mmol/L   Glucose, Bld 97 70 - 99 mg/dL   BUN 16 8 - 23 mg/dL   Creatinine, Ser 0.86 0.44 - 1.00 mg/dL   Calcium 9.2 8.9 - 10.3 mg/dL   GFR, Estimated >60 >60 mL/min   Anion gap 6 5 - 15  CBC  Result Value Ref Range   WBC 7.1 4.0 - 10.5 K/uL   RBC 4.52 3.87 - 5.11 MIL/uL   Hemoglobin 14.5 12.0 - 15.0 g/dL   HCT 43.3 36.0 - 46.0 %   MCV 95.8 80.0 - 100.0 fL   MCH 32.1 26.0 - 34.0 pg   MCHC 33.5 30.0 - 36.0 g/dL   RDW 13.4 11.5 - 15.5 %   Platelets 248 150 - 400 K/uL   nRBC 0.0 0.0 - 0.2 %  Troponin I (High Sensitivity)  Result Value Ref Range   Troponin I (High Sensitivity) 7 <18 ng/L  Troponin I (High Sensitivity)  Result Value Ref Range   Troponin I (  High Sensitivity) 7 <18 ng/L        Current Outpatient Medications:    salicylic acid 17 % gel, Apply topically daily. To callus area at bedtime and  cover with bandaid, Disp: 14 g, Rfl: 0  Current Facility-Administered Medications:    triamcinolone acetonide (KENALOG) 10 MG/ML injection 10 mg, 10 mg, Other, Once, Stover, Titorya, DPM   triamcinolone acetonide (KENALOG) 10 MG/ML injection 10 mg, 10 mg, Other, Once, Landis Martins, DPM    Assessment & Plan:   HTN :  Continue current meds.  Medication compliance emphasised. pt advised to keep Bp logs. Pt verbalised understanding of the same. Pt to have a low salt diet . Exercise to reach a goal of at least 150 mins a week.  lifestyle modifications explained and pt understands importance of the above.  Breast cancer Stable, chronic resolved status post surgery chemo and radiation lost to follow-up with heme-onc we will schedule mammogram  Foot and anle pain /l; pt seen y podiatry for such, has had metatarsal pain on right  Patient to have supportive shoes per podiatry. Has an inflammatory arthritis of the right heel as well status post foot surgery of the right foot. Is scheduled to see podiatry for orthotics. Follow-up as recommended per podiatry for ankle pain  Problem List Items Addressed This Visit   None    No orders of the defined types were placed in this encounter.    No orders of the defined types were placed in this encounter.    Follow up plan: No follow-ups on file.  Health Maintenance : Mammogram/ Paps smear: DEXA: Ordered not been done in the past Cscope : 2016 next due in 2026 Pneumonia vaccine :  pneumovax 2016 needs Prevnar next visit  Labs next visit : CBC, CMP, FLP, HBA1C, TSH, PSA, urine microalbumin Labs 1 week prior to next visit.

## 2021-10-31 NOTE — Patient Instructions (Addendum)
Please call to schedule your mammogram and/or bone density: [facility]  Address: [address], [place], [place] [address]  Phone: [phone]    Osteoporosis Osteoporosis is when the bones get thin and weak. This can cause your bones to break (fracture) more easily. What are the causes? The exact cause of this condition is not known. What increases the risk? Having family members with this condition. Not eating enough healthy foods. Taking certain medicines. Being female. Being age 67 or older. Smoking or using other products that contain nicotine or tobacco, such as e-cigarettes or chewing tobacco. Not exercising. Being of European or Asian ancestry. Having a small body frame. What are the signs or symptoms? A broken bone might be the first sign, especially if the break results from a fall or injury that usually would not cause a bone to break. Other signs and symptoms include: Pain in the neck or low back. Being hunched over (stooped posture). Getting shorter. How is this treated? Eating more foods with more calcium and vitamin D in them. Doing exercises. Stopping tobacco use. Limiting how much alcohol you drink. Taking medicines to slow bone loss or help make the bones stronger. Taking supplements of calcium and vitamin D every day. Taking medicines to replace chemicals in the body (hormone replacement medicines). Monitoring your levels of calcium and vitamin D. The goal of treatment is to strengthen your bones and lower your risk for a bone break. Follow these instructions at home: Eating and drinking Eat plenty of calcium and vitamin D. These nutrients are good for your bones. Good sources of calcium and vitamin D include: Some fish, such as salmon and tuna. Foods that have calcium and vitamin D added to them (fortified foods), such as some breakfast cereals. Egg yolks. Cheese. Liver.  Activity Do exercises as told by  your doctor. Ask your doctor what exercises are safe for you. You should do: Exercises that make your muscles work to hold your body weight up (weight-bearing exercises). These include tai chi, yoga, and walking. Exercises to make your muscles stronger. One example is lifting weights. Lifestyle Do not drink alcohol if: Your doctor tells you not to drink. You are pregnant, may be pregnant, or are planning to become pregnant. If you drink alcohol: Limit how much you use to: 0-1 drink a day for women. 0-2 drinks a day for men. Know how much alcohol is in your drink. In the [place] one drink equals one 12 oz bottle of beer (355 mL), one 5 oz glass of wine (148 mL), or one 1 oz glass of hard liquor (44 mL). Do not smoke or use any products that contain nicotine or tobacco. If you need help quitting, ask your doctor. Preventing falls Use tools to help you move around (mobility aids) as needed. These include canes, walkers, scooters, and crutches. Keep rooms well-lit. Put away things on the floor that could make you trip. These include cords and rugs. Install safety rails on stairs. Install grab bars in bathrooms. Use rubber mats in slippery areas, like bathrooms. Wear shoes that: Fit you well. Support your feet. Have closed toes. Have rubber soles or low heels. Tell your doctor about all of the medicines you are taking. Some medicines can make you more likely to fall. General instructions Take over-the-counter and prescription medicines only as told by your doctor. Keep all follow-up visits. Contact a doctor if: You have not been tested (screened) for osteoporosis and you  are: A woman who is age 5 or older. A man who is age 18 or older. Get help right away if: You fall. You get hurt. Summary Osteoporosis happens when your bones get thin and weak. Weak bones can break (fracture) more easily. Eat plenty of calcium and vitamin D. These are good for your bones. Tell your doctor about all  of the medicines that you take. This information is not intended to replace advice given to you by your health care provider. Make sure you discuss any questions you have with your health care provider. Document Revised: 05/24/2020 Document Reviewed: 05/24/2020     Elsevier Patient Education  [place].

## 2021-11-01 ENCOUNTER — Inpatient Hospital Stay
Admission: RE | Admit: 2021-11-01 | Discharge: 2021-11-01 | Disposition: A | Discharge status: Home / Self Care | Payer: Self-pay | Source: Ambulatory Visit | Attending: *Deleted | Admitting: *Deleted

## 2021-11-01 ENCOUNTER — Other Ambulatory Visit: Payer: Self-pay | Admitting: *Deleted

## 2021-11-01 ENCOUNTER — Other Ambulatory Visit: Payer: Self-pay | Admitting: Internal Medicine

## 2021-11-01 DIAGNOSIS — Z1231 Encounter for screening mammogram for malignant neoplasm of breast: Secondary | ICD-10-CM

## 2021-11-04 ENCOUNTER — Other Ambulatory Visit: Payer: Medicare Other

## 2021-11-12 ENCOUNTER — Other Ambulatory Visit: Payer: Self-pay

## 2021-11-12 ENCOUNTER — Other Ambulatory Visit: Payer: Medicare Other

## 2021-11-12 DIAGNOSIS — N189 Chronic kidney disease, unspecified: Secondary | ICD-10-CM

## 2021-11-12 DIAGNOSIS — Z720 Tobacco use: Secondary | ICD-10-CM

## 2021-11-12 DIAGNOSIS — R030 Elevated blood-pressure reading, without diagnosis of hypertension: Secondary | ICD-10-CM

## 2021-11-12 LAB — URINALYSIS, ROUTINE W REFLEX MICROSCOPIC
Bilirubin, UA: NEGATIVE
Glucose, UA: NEGATIVE
Ketones, UA: NEGATIVE
Leukocytes,UA: NEGATIVE
Nitrite, UA: NEGATIVE
Protein,UA: NEGATIVE
Specific Gravity, UA: 1.02 (ref 1.005–1.030)
Urobilinogen, Ur: 0.2 mg/dL (ref 0.2–1.0)
pH, UA: 7 (ref 5.0–7.5)

## 2021-11-12 LAB — MICROSCOPIC EXAMINATION
Bacteria, UA: NONE SEEN
Epithelial Cells (non renal): NONE SEEN /hpf (ref 0–10)
WBC, UA: NONE SEEN /hpf (ref 0–5)

## 2021-11-13 ENCOUNTER — Encounter: Payer: Self-pay | Admitting: Family Medicine

## 2021-11-13 LAB — CBC WITH DIFFERENTIAL/PLATELET
Basophils Absolute: 0 10*3/uL (ref 0.0–0.2)
Basos: 0 %
EOS (ABSOLUTE): 0.1 10*3/uL (ref 0.0–0.4)
Eos: 1 %
Hematocrit: 42.5 % (ref 34.0–46.6)
Hemoglobin: 14.2 g/dL (ref 11.1–15.9)
Immature Grans (Abs): 0 10*3/uL (ref 0.0–0.1)
Immature Granulocytes: 0 %
Lymphocytes Absolute: 2 10*3/uL (ref 0.7–3.1)
Lymphs: 30 %
MCH: 31.9 pg (ref 26.6–33.0)
MCHC: 33.4 g/dL (ref 31.5–35.7)
MCV: 96 fL (ref 79–97)
Monocytes Absolute: 0.4 10*3/uL (ref 0.1–0.9)
Monocytes: 6 %
Neutrophils Absolute: 4.2 10*3/uL (ref 1.4–7.0)
Neutrophils: 63 %
Platelets: 254 10*3/uL (ref 150–450)
RBC: 4.45 x10E6/uL (ref 3.77–5.28)
RDW: 12.7 % (ref 11.7–15.4)
WBC: 6.7 10*3/uL (ref 3.4–10.8)

## 2021-11-13 LAB — COMPREHENSIVE METABOLIC PANEL
ALT: 16 IU/L (ref 0–32)
AST: 20 IU/L (ref 0–40)
Albumin/Globulin Ratio: 2.2 (ref 1.2–2.2)
Albumin: 4.4 g/dL (ref 3.8–4.8)
Alkaline Phosphatase: 81 IU/L (ref 44–121)
BUN/Creatinine Ratio: 16 (ref 12–28)
BUN: 14 mg/dL (ref 8–27)
Bilirubin Total: 0.3 mg/dL (ref 0.0–1.2)
CO2: 23 mmol/L (ref 20–29)
Calcium: 9.2 mg/dL (ref 8.7–10.3)
Chloride: 104 mmol/L (ref 96–106)
Creatinine, Ser: 0.88 mg/dL (ref 0.57–1.00)
Globulin, Total: 2 g/dL (ref 1.5–4.5)
Glucose: 101 mg/dL — ABNORMAL HIGH (ref 70–99)
Potassium: 4.5 mmol/L (ref 3.5–5.2)
Sodium: 142 mmol/L (ref 134–144)
Total Protein: 6.4 g/dL (ref 6.0–8.5)
eGFR: 72 mL/min/{1.73_m2} (ref >59)

## 2021-11-13 LAB — LIPID PANEL
Chol/HDL Ratio: 4.3 ratio (ref 0.0–4.4)
Cholesterol, Total: 196 mg/dL (ref 100–199)
HDL: 46 mg/dL (ref >39)
LDL Chol Calc (NIH): 128 mg/dL — ABNORMAL HIGH (ref 0–99)
Triglycerides: 121 mg/dL (ref 0–149)
VLDL Cholesterol Cal: 22 mg/dL (ref 5–40)

## 2021-11-13 LAB — TSH: TSH: 3.04 u[IU]/mL (ref 0.450–4.500)

## 2021-11-28 ENCOUNTER — Ambulatory Visit: Payer: Medicare Other | Admitting: Internal Medicine

## 2021-12-02 ENCOUNTER — Ambulatory Visit (INDEPENDENT_AMBULATORY_CARE_PROVIDER_SITE_OTHER): Payer: Medicare Other | Admitting: Internal Medicine

## 2021-12-02 ENCOUNTER — Encounter: Payer: Self-pay | Admitting: Internal Medicine

## 2021-12-02 ENCOUNTER — Other Ambulatory Visit: Payer: Self-pay

## 2021-12-02 VITALS — BP 169/82 | HR 80 | Temp 98.1°F | Ht 61.02 in | Wt 165.2 lb

## 2021-12-02 DIAGNOSIS — I10 Essential (primary) hypertension: Secondary | ICD-10-CM | POA: Diagnosis not present

## 2021-12-02 DIAGNOSIS — M79671 Pain in right foot: Secondary | ICD-10-CM

## 2021-12-02 MED ORDER — AMLODIPINE BESYLATE 2.5 MG PO TABS: 2.5000 mg | ORAL_TABLET | Freq: Every day | ORAL | 3 refills | Status: DC

## 2021-12-02 NOTE — Patient Instructions (Signed)
Latest Reference Range & Units 11/12/21 10:33  COMPREHENSIVE METABOLIC PANEL  Rpt !  Sodium 134 - 144 mmol/L 142  Potassium 3.5 - 5.2 mmol/L 4.5  Chloride 96 - 106 mmol/L 104  CO2 20 - 29 mmol/L 23  Glucose 70 - 99 mg/dL 101 (H)  BUN 8 - 27 mg/dL 14  Creatinine 0.57 - 1.00 mg/dL 0.88  Calcium 8.7 - 10.3 mg/dL 9.2  BUN/Creatinine Ratio 12 - 28  16  eGFR >59 mL/min/1.73 72  Alkaline Phosphatase 44 - 121 IU/L 81  Albumin 3.8 - 4.8 g/dL 4.4  Albumin/Globulin Ratio 1.2 - 2.2  2.2  AST 0 - 40 IU/L 20  ALT 0 - 32 IU/L 16  Total Protein 6.0 - 8.5 g/dL 6.4  Total Bilirubin 0.0 - 1.2 mg/dL 0.3  !: Data is abnormal (H): Data is abnormally high Rpt: View report in Results Review for more information

## 2021-12-02 NOTE — Progress Notes (Signed)
BP (!) 169/82   Pulse 80   Temp 98.1 F (36.7 C) (Oral)   Ht 5' 1.02" (1.55 m)   Wt 165 lb 3.2 oz (74.9 kg)   SpO2 96%   BMI 31.19 kg/m    Subjective:    Patient ID: Hailey Patterson, female    DOB: June 26, 1956, 65 y.o.   MRN: [id number]  Chief Complaint  Patient presents with   Mammogram    And wants a Dexa scan     HPI: Hailey Patterson is a 65 y.o. female  Hypertension This is a chronic (says she has felt like throwing up and went to lay down and this helped) problem. The current episode started more than 1 year ago. The problem has been gradually worsening since onset. The problem is uncontrolled. Associated symptoms include anxiety and blurred vision. Pertinent negatives include no chest pain, headaches, malaise/fatigue, neck pain, orthopnea, palpitations, peripheral edema, PND, shortness of breath or sweats. (Has been lightheaded per her verbal records vision gets blurry +)   Chief Complaint  Patient presents with   Mammogram    And wants a Dexa scan     Relevant past medical, surgical, family and social history reviewed and updated as indicated. Interim medical history since our last visit reviewed. Allergies and medications reviewed and updated.  Review of Systems  Constitutional:  Negative for malaise/fatigue.  Eyes:  Positive for blurred vision.  Respiratory:  Negative for shortness of breath.   Cardiovascular:  Negative for chest pain, palpitations, orthopnea and PND.  Musculoskeletal:  Negative for neck pain.  Neurological:  Negative for headaches.   Per HPI unless specifically indicated above     Objective:    BP (!) 169/82   Pulse 80   Temp 98.1 F (36.7 C) (Oral)   Ht 5' 1.02" (1.55 m)   Wt 165 lb 3.2 oz (74.9 kg)   SpO2 96%   BMI 31.19 kg/m   Wt Readings from Last 3 Encounters:  12/02/21 165 lb 3.2 oz (74.9 kg)  10/31/21 (P) 163 lb 6.4 oz (74.1 kg)  11/12/20 150 lb (68 kg)    Physical Exam Vitals and nursing note reviewed.   Constitutional:      General: She is not in acute distress.    Appearance: Normal appearance. She is not ill-appearing or diaphoretic.  Eyes:     Conjunctiva/sclera: Conjunctivae normal.  Pulmonary:     Breath sounds: No rhonchi.  Abdominal:     General: Abdomen is flat. Bowel sounds are normal. There is no distension.     Palpations: Abdomen is soft. There is no mass.     Tenderness: There is no abdominal tenderness. There is no guarding.  Skin:    General: Skin is warm and dry.     Coloration: Skin is not jaundiced.     Findings: No erythema.  Neurological:     Mental Status: She is alert.   Results for orders placed or performed in visit on 11/12/21  Microscopic Examination   Urine  Result Value Ref Range   WBC, UA None seen 0 - 5 /hpf   RBC 0-2 0 - 2 /hpf   Epithelial Cells (non renal) None seen 0 - 10 /hpf   Bacteria, UA None seen None seen/Few  Lipid panel  Result Value Ref Range   Cholesterol, Total 196 100 - 199 mg/dL   Triglycerides 121 0 - 149 mg/dL   HDL 46 >39 mg/dL   VLDL Cholesterol Cal 22  5 - 40 mg/dL   LDL Chol Calc (NIH) 128 (H) 0 - 99 mg/dL   Chol/HDL Ratio 4.3 0.0 - 4.4 ratio  Urinalysis, Routine w reflex microscopic  Result Value Ref Range   Specific Gravity, UA 1.020 1.005 - 1.030   pH, UA 7.0 5.0 - 7.5   Color, UA Yellow Yellow   Appearance Ur Clear Clear   Leukocytes,UA Negative Negative   Protein,UA Negative Negative/Trace   Glucose, UA Negative Negative   Ketones, UA Negative Negative   RBC, UA Trace (A) Negative   Bilirubin, UA Negative Negative   Urobilinogen, Ur 0.2 0.2 - 1.0 mg/dL   Nitrite, UA Negative Negative   Microscopic Examination See below:   TSH  Result Value Ref Range   TSH 3.040 0.450 - 4.500 uIU/mL  Comprehensive metabolic panel  Result Value Ref Range   Glucose 101 (H) 70 - 99 mg/dL   BUN 14 8 - 27 mg/dL   Creatinine, Ser 0.88 0.57 - 1.00 mg/dL   eGFR 72 >59 mL/min/1.73   BUN/Creatinine Ratio 16 12 - 28   Sodium  142 134 - 144 mmol/L   Potassium 4.5 3.5 - 5.2 mmol/L   Chloride 104 96 - 106 mmol/L   CO2 23 20 - 29 mmol/L   Calcium 9.2 8.7 - 10.3 mg/dL   Total Protein 6.4 6.0 - 8.5 g/dL   Albumin 4.4 3.8 - 4.8 g/dL   Globulin, Total 2.0 1.5 - 4.5 g/dL   Albumin/Globulin Ratio 2.2 1.2 - 2.2   Bilirubin Total 0.3 0.0 - 1.2 mg/dL   Alkaline Phosphatase 81 44 - 121 IU/L   AST 20 0 - 40 IU/L   ALT 16 0 - 32 IU/L  CBC with Differential/Platelet  Result Value Ref Range   WBC 6.7 3.4 - 10.8 x10E3/uL   RBC 4.45 3.77 - 5.28 x10E6/uL   Hemoglobin 14.2 11.1 - 15.9 g/dL   Hematocrit 42.5 34.0 - 46.6 %   MCV 96 79 - 97 fL   MCH 31.9 26.6 - 33.0 pg   MCHC 33.4 31.5 - 35.7 g/dL   RDW 12.7 11.7 - 15.4 %   Platelets 254 150 - 450 x10E3/uL   Neutrophils 63 Not Estab. %   Lymphs 30 Not Estab. %   Monocytes 6 Not Estab. %   Eos 1 Not Estab. %   Basos 0 Not Estab. %   Neutrophils Absolute 4.2 1.4 - 7.0 x10E3/uL   Lymphocytes Absolute 2.0 0.7 - 3.1 x10E3/uL   Monocytes Absolute 0.4 0.1 - 0.9 x10E3/uL   EOS (ABSOLUTE) 0.1 0.0 - 0.4 x10E3/uL   Basophils Absolute 0.0 0.0 - 0.2 x10E3/uL   Immature Granulocytes 0 Not Estab. %   Immature Grans (Abs) 0.0 0.0 - 0.1 x10E3/uL        Current Outpatient Medications:    amLODipine (NORVASC) 2.5 MG tablet, Take 1 tablet (2.5 mg total) by mouth daily., Disp: 30 tablet, Rfl: 3   salicylic acid 17 % gel, Apply topically daily. To callus area at bedtime and cover with bandaid (Patient not taking: Reported on 12/02/2021), Disp: 14 g, Rfl: 0  Current Facility-Administered Medications:    triamcinolone acetonide (KENALOG) 10 MG/ML injection 10 mg, 10 mg, Other, Once, Stover, Titorya, DPM   triamcinolone acetonide (KENALOG) 10 MG/ML injection 10 mg, 10 mg, Other, Once, Landis Martins, DPM    Assessment & Plan:  HTN:  will start pt on amlodipine 2.5 mg Continue current meds.  Medication compliance emphasised. pt advised  to keep Bp logs. Pt verbalised understanding of the  same. Pt to have a low salt diet . Exercise to reach a goal of at least 150 mins a week.  lifestyle modifications explained and pt understands importance of the above.  Foot and anle pain /l; pt seen y podiatry for such, has had metatarsal pain on right  Patient to have supportive shoes per podiatry. Has an inflammatory arthritis of the right heel as well status post foot surgery of the right foot. Is scheduled to see podiatry for orthotics.Seen someone in [place] - has a callous - seen an orthopedic / podiatrist.   Problem List Items Addressed This Visit       Cardiovascular and Mediastinum   Primary hypertension - Primary   Relevant Medications   amLODipine (NORVASC) 2.5 MG tablet     Other   Right foot pain     No orders of the defined types were placed in this encounter.     Meds ordered this encounter  Medications   amLODipine (NORVASC) 2.5 MG tablet    Sig: Take 1 tablet (2.5 mg total) by mouth daily.    Dispense:  30 tablet    Refill:  3     Follow up plan: Return in about 1 week (around 12/09/2021).

## 2021-12-03 DIAGNOSIS — M79671 Pain in right foot: Secondary | ICD-10-CM | POA: Insufficient documentation

## 2021-12-09 ENCOUNTER — Ambulatory Visit: Payer: Medicare Other | Admitting: Internal Medicine

## 2021-12-24 ENCOUNTER — Other Ambulatory Visit: Payer: Self-pay

## 2021-12-24 ENCOUNTER — Ambulatory Visit
Admission: RE | Admit: 2021-12-24 | Discharge: 2021-12-24 | Disposition: A | Discharge status: Home / Self Care | Payer: Medicare Other | Source: Ambulatory Visit | Attending: Internal Medicine | Admitting: Internal Medicine

## 2021-12-24 DIAGNOSIS — Z1231 Encounter for screening mammogram for malignant neoplasm of breast: Secondary | ICD-10-CM | POA: Diagnosis not present

## 2021-12-25 ENCOUNTER — Telehealth: Payer: Self-pay | Admitting: *Deleted

## 2021-12-25 NOTE — Progress Notes (Signed)
Please let pt know this was normal.

## 2021-12-25 NOTE — Telephone Encounter (Signed)
Reviewed results and physician's note with the patient.

## 2022-01-01 ENCOUNTER — Ambulatory Visit (INDEPENDENT_AMBULATORY_CARE_PROVIDER_SITE_OTHER): Payer: Medicare Other | Admitting: *Deleted

## 2022-01-01 DIAGNOSIS — Z780 Asymptomatic menopausal state: Secondary | ICD-10-CM | POA: Diagnosis not present

## 2022-01-01 DIAGNOSIS — Z0000 Encounter for general adult medical examination without abnormal findings: Secondary | ICD-10-CM | POA: Diagnosis not present

## 2022-01-01 NOTE — Progress Notes (Signed)
Subjective:   Hailey Patterson is a 66 y.o. female who presents for Medicare Annual (Subsequent) preventive examination.  I connected with  [name] on 01/01/22 by a telephone enabled telemedicine application and verified that I am speaking with the correct person using two identifiers.   I discussed the limitations of evaluation and management by telemedicine. The patient expressed understanding and agreed to proceed.  Patient location: home  Provider location:   tele-Health  not in office    Review of Systems     Cardiac Risk Factors include: advanced age (>23men, >58 women);sedentary lifestyle;smoking/ tobacco exposure;obesity (BMI >30kg/m2)     Objective:    Today's Vitals   01/01/22 0817  PainSc: 8    There is no height or weight on file to calculate BMI.  Advanced Directives 01/01/2022 11/12/2020 09/29/2016 09/24/2016 05/01/2015 05/01/2015  Does Patient Have a Medical Advance Directive? No No No No No No  Would patient like information on creating a medical advance directive? No - Patient declined - Yes - Educational materials given - No - patient declined information -    Current Medications (verified) Outpatient Encounter Medications as of 01/01/2022  Medication Sig   amLODipine (NORVASC) 2.5 MG tablet Take 1 tablet (2.5 mg total) by mouth daily. (Patient not taking: Reported on 9/50/9326)   salicylic acid 17 % gel Apply topically daily. To callus area at bedtime and cover with bandaid (Patient not taking: Reported on 01/01/2022)   Facility-Administered Encounter Medications as of 01/01/2022  Medication   triamcinolone acetonide (KENALOG) 10 MG/ML injection 10 mg   triamcinolone acetonide (KENALOG) 10 MG/ML injection 10 mg    Allergies (verified) Macrobid [nitrofurantoin], Losartan, Sulfa antibiotics, Codeine, Meperidine, and Morphine   History: Past Medical History:  Diagnosis Date   Anxiety    Breast cancer [facility])    Past Surgical History:  Procedure  Laterality Date   ABDOMINAL HYSTERECTOMY     BREAST BIOPSY Right 2002   positive biopsy   BREAST BIOPSY Right 2006   MRI biopsies X 2 areas both 12:00 (subareolar area and mid position), benign. Only visible with one clip   BREAST LUMPECTOMY Right 2002   positive, radiation and chemo   CESAREAN SECTION     COLONOSCOPY N/A 05/01/2015   Procedure: COLONOSCOPY;  Surgeon: Josefine Class, MD;  Location: [facility];  Service: Endoscopy;  Laterality: N/A;   EXOSTECTECTOMY TOE Right 09/29/2016   Procedure: EXOSTECTECTOMY TOE 3RD RIGHT TOE;  Surgeon: Landis Martins, DPM;  Location: [place];  Service: Podiatry;  Laterality: Right;   FOOT SURGERY     bilateral feet   KIDNEY SURGERY     MASS EXCISION Right 09/29/2016   Procedure: EXCISION BENIGN LESION 1.0 CM SUB 3 RIGHT;  Surgeon: Landis Martins, DPM;  Location: [place];  Service: Podiatry;  Laterality: Right;   OSTECTOMY Right 09/29/2016   Procedure: BASE WEDGE OSTECTOMY FIRST RIGHT TOE;  Surgeon: Landis Martins, DPM;  Location: [place];  Service: Podiatry;  Laterality: Right;   TONSILLECTOMY     Family History  Problem Relation Age of Onset   Breast cancer Mother    Bladder Cancer Neg Hx    Kidney cancer Neg Hx    Social History   Socioeconomic History   Marital status: Married    Spouse name: Not on file   Number of children: Not on file   Years of education: Not on file   Highest education level: Not on  file  Occupational History   Not on file  Tobacco Use   Smoking status: Heavy Smoker    Packs/day: 1.00    Years: 52.00    Pack years: 52.00    Types: Cigarettes   Smokeless tobacco: Never  Substance and Sexual Activity   Alcohol use: No   Drug use: No   Sexual activity: Not Currently  Other Topics Concern   Not on file  Social History Narrative   Not on file   Social Determinants of Health   Financial Resource Strain: Low Risk    Difficulty of Paying  Living Expenses: Not hard at all  Food Insecurity: No Food Insecurity   Worried About Charity fundraiser in the Last Year: Never true   Ran Out of Food in the Last Year: Never true  Transportation Needs: No Transportation Needs   Lack of Transportation (Medical): No   Lack of Transportation (Non-Medical): No  Physical Activity: Inactive   Days of Exercise per Week: 0 days   Minutes of Exercise per Session: 0 min  Stress: No Stress Concern Present   Feeling of Stress : Not at all  Social Connections: Unknown   Frequency of Communication with Friends and Family: Three times a week   Frequency of Social Gatherings with Friends and Family: Three times a week   Attends Religious Services: Not on file   Active Member of Clubs or Organizations: No   Attends Archivist Meetings: Never   Marital Status: Married    Tobacco Counseling Ready to quit: Not Answered Counseling given: Not Answered   Clinical Intake:  Pre-visit preparation completed: Yes  Pain : 0-10 Pain Score: 8  Pain Location: Knee Pain Orientation: Left Pain Descriptors / Indicators: Constant, Burning Pain Onset: More than a month ago Pain Frequency: Constant Pain Relieving Factors: tylenol  Pain Relieving Factors: tylenol  Nutritional Risks: None Diabetes: No  How often do you need to have someone help you when you read instructions, pamphlets, or other written materials from your doctor or pharmacy?: 1 - Never  Diabetic?  no  Interpreter Needed?: No  Information entered by :: Leroy Kennedy LPN   Activities of Daily Living In your present state of health, do you have any difficulty performing the following activities: 01/01/2022  Hearing? N  Vision? N  Difficulty concentrating or making decisions? N  Walking or climbing stairs? N  Dressing or bathing? N  Doing errands, shopping? N  Preparing Food and eating ? N  Using the Toilet? N  In the past six months, have you accidently leaked urine? N   Do you have problems with loss of bowel control? N  Managing your Medications? N  Managing your Finances? N  Housekeeping or managing your Housekeeping? N  Some recent data might be hidden    Patient Care Team: Charlynne Cousins, MD as PCP - General (Internal Medicine)  Indicate any recent Medical Services you may have received from other than [facility] providers in the past year (date may be approximate).     Assessment:   This is a routine wellness examination for [place].  Hearing/Vision screen Hearing Screening - Comments:: No trouble hearing Vision Screening - Comments:: Not up to date  Dietary issues and exercise activities discussed: Current Exercise Habits: The patient does not participate in regular exercise at present, Exercise limited by: None identified   Goals Addressed             This Visit's Progress  Patient Stated       No goals       Depression Screen PHQ 2/9 Scores 01/01/2022 12/02/2021 10/31/2021  PHQ - 2 Score 0 0 0  PHQ- 9 Score 0 0 0    Fall Risk Fall Risk  01/01/2022 12/02/2021 10/31/2021  Falls in the past year? 0 0 0  Number falls in past yr: 0 0 0  Injury with Fall? 0 0 0  Risk for fall due to : - No Fall Risks -  Follow up Falls evaluation completed;Falls prevention discussed Falls evaluation completed Falls evaluation completed    FALL RISK PREVENTION PERTAINING TO THE HOME:  Any stairs in or around the home? No  If so, are there any without handrails? No  Home free of loose throw rugs in walkways, pet beds, electrical cords, etc? Yes  Adequate lighting in your home to reduce risk of falls? Yes   ASSISTIVE DEVICES UTILIZED TO PREVENT FALLS:  Life alert? No  Use of a cane, walker or w/c? No  Grab bars in the bathroom? Yes  Shower chair or bench in shower? No  Elevated toilet seat or a handicapped toilet? No   TIMED UP AND GO:  Was the test performed? No .    Cognitive Function:  Normal cognitive status assessed by direct  observation by this Nurse Health Advisor. No abnormalities found.          Immunizations Immunization History  Administered Date(s) Administered   Influenza,inj,Quad PF,6+ Mos 09/07/2014, 01/10/2016   Influenza-Unspecified 09/07/2014, 01/10/2016   Pneumococcal Polysaccharide-23 05/23/2015   Td 09/19/2002   Tdap 09/07/2014    TDAP status: Up to date  Flu Vaccine status: Declined, Education has been provided regarding the importance of this vaccine but patient still declined. Advised may receive this vaccine at local pharmacy or Health Dept. Aware to provide a copy of the vaccination record if obtained from local pharmacy or Health Dept. Verbalized acceptance and understanding.  Pneumococcal vaccine status: Due, Education has been provided regarding the importance of this vaccine. Advised may receive this vaccine at local pharmacy or Health Dept. Aware to provide a copy of the vaccination record if obtained from local pharmacy or Health Dept. Verbalized acceptance and understanding.  Covid-19 vaccine status: Declined, Education has been provided regarding the importance of this vaccine but patient still declined. Advised may receive this vaccine at local pharmacy or Health Dept.or vaccine clinic. Aware to provide a copy of the vaccination record if obtained from local pharmacy or Health Dept. Verbalized acceptance and understanding.  Qualifies for Shingles Vaccine? Yes   Zostavax completed No   Shingrix Completed?: No.    Education has been provided regarding the importance of this vaccine. Patient has been advised to call insurance company to determine out of pocket expense if they have not yet received this vaccine. Advised may also receive vaccine at local pharmacy or Health Dept. Verbalized acceptance and understanding.  Screening Tests Health Maintenance  Topic Date Due   COVID-19 Vaccine (1) 01/17/2022 (Originally 10/06/1956)   Zoster Vaccines- Shingrix (1 of 2) 01/31/2022  (Originally 04/06/1975)   INFLUENZA VACCINE  03/21/2022 (Originally 07/22/2021)   Pneumonia Vaccine 84+ Years old (2 - PCV) 10/31/2022 (Originally 05/22/2016)   DEXA SCAN  10/31/2022 (Originally 04/07/2019)   Hepatitis C Screening  10/31/2022 (Originally 04/06/1974)   MAMMOGRAM  12/25/2023   TETANUS/TDAP  09/07/2024   COLONOSCOPY (Pts 45-81yrs Insurance coverage will need to be confirmed)  04/30/2025   HPV VACCINES  Aged Out  Health Maintenance  There are no preventive care reminders to display for this patient.   Colorectal cancer screening: Type of screening: Colonoscopy. Completed  . Repeat every 10 years  Mammogram status: Completed  . Repeat every year  Bone Density status: Ordered  . Pt provided with contact info and advised to call to schedule appt.  Lung Cancer Screening: (Low Dose CT Chest recommended if Age 22-80 years, 30 pack-year currently smoking OR have quit w/in 15years.) does not qualify.   Lung Cancer Screening Referral:   Additional Screening:  Hepatitis C Screening: does not qualify; Completed   Vision Screening: Recommended annual ophthalmology exams for early detection of glaucoma and other disorders of the eye. Is the patient up to date with their annual eye exam?  No  Who is the provider or what is the name of the office in which the patient attends annual eye exams?  If pt is not established with a provider, would they like to be referred to a provider to establish care? No .   Dental Screening: Recommended annual dental exams for proper oral hygiene  Community Resource Referral / Chronic Care Management: CRR required this visit?  No   CCM required this visit?  No      Plan:     I have personally reviewed and noted the following in the patients chart:   Medical and social history Use of alcohol, tobacco or illicit drugs  Current medications and supplements including opioid prescriptions.  Functional ability and status Nutritional  status Physical activity Advanced directives List of other physicians Hospitalizations, surgeries, and ER visits in previous 12 months Vitals Screenings to include cognitive, depression, and falls Referrals and appointments  In addition, I have reviewed and discussed with patient certain preventive protocols, quality metrics, and best practice recommendations. A written personalized care plan for preventive services as well as general preventive health recommendations were provided to patient.     Leroy Kennedy, LPN   1/96/2229   Nurse Notes:

## 2022-01-01 NOTE — Patient Instructions (Signed)
[name] , Thank you for taking time to come for your Medicare Wellness Visit. I appreciate your ongoing commitment to your health goals. Please review the following plan we discussed and let me know if I can assist you in the future.   Screening recommendations/referrals: Colonoscopy: up to date Mammogram: up to date Bone Density: Education provided Recommended yearly ophthalmology/optometry visit for glaucoma screening and checkup Recommended yearly dental visit for hygiene and checkup  Vaccinations: Influenza vaccine: Education provided Pneumococcal vaccine: Education provided Tdap vaccine: up to date Shingles vaccine: Education provided    Advanced directives: Education provided  Conditions/risks identified:      Preventive Care 68 Years and Older, Female Preventive care refers to lifestyle choices and visits with your health care provider that can promote health and wellness. What does preventive care include? A yearly physical exam. This is also called an annual well check. Dental exams once or twice a year. Routine eye exams. Ask your health care provider how often you should have your eyes checked. Personal lifestyle choices, including: Daily care of your teeth and gums. Regular physical activity. Eating a healthy diet. Avoiding tobacco and drug use. Limiting alcohol use. Practicing safe sex. Taking low-dose aspirin every day. Taking vitamin and mineral supplements as recommended by your health care provider. What happens during an annual well check? The services and screenings done by your health care provider during your annual well check will depend on your age, overall health, lifestyle risk factors, and family history of disease. Counseling  Your health care provider may ask you questions about your: Alcohol use. Tobacco use. Drug use. Emotional well-being. Home and relationship well-being. Sexual activity. Eating habits. History of falls. Memory and  ability to understand (cognition). Work and work Statistician. Reproductive health. Screening  You may have the following tests or measurements: Height, weight, and BMI. Blood pressure. Lipid and cholesterol levels. These may be checked every 5 years, or more frequently if you are over 34 years old. Skin check. Lung cancer screening. You may have this screening every year starting at age 52 if you have a 30-pack-year history of smoking and currently smoke or have quit within the past 15 years. Fecal occult blood test (FOBT) of the stool. You may have this test every year starting at age 67. Flexible sigmoidoscopy or colonoscopy. You may have a sigmoidoscopy every 5 years or a colonoscopy every 10 years starting at age 89. Hepatitis C blood test. Hepatitis B blood test. Sexually transmitted disease (STD) testing. Diabetes screening. This is done by checking your blood sugar (glucose) after you have not eaten for a while (fasting). You may have this done every 1-3 years. Bone density scan. This is done to screen for osteoporosis. You may have this done starting at age 42. Mammogram. This may be done every 1-2 years. Talk to your health care provider about how often you should have regular mammograms. Talk with your health care provider about your test results, treatment options, and if necessary, the need for more tests. Vaccines  Your health care provider may recommend certain vaccines, such as: Influenza vaccine. This is recommended every year. Tetanus, diphtheria, and acellular pertussis (Tdap, Td) vaccine. You may need a Td booster every 10 years. Zoster vaccine. You may need this after age 76. Pneumococcal 13-valent conjugate (PCV13) vaccine. One dose is recommended after age 16. Pneumococcal polysaccharide (PPSV23) vaccine. One dose is recommended after age 25. Talk to your health care provider about which screenings and vaccines you need and  how often you need them. This information is  not intended to replace advice given to you by your health care provider. Make sure you discuss any questions you have with your health care provider. Document Released: 01/04/2016 Document Revised: 08/27/2016 Document Reviewed: 10/09/2015 Elsevier Interactive Patient Education  2017 [place] Prevention in the Home Falls can cause injuries. They can happen to people of all ages. There are many things you can do to make your home safe and to help prevent falls. What can I do on the outside of my home? Regularly fix the edges of walkways and driveways and fix any cracks. Remove anything that might make you trip as you walk through a door, such as a raised step or threshold. Trim any bushes or trees on the path to your home. Use bright outdoor lighting. Clear any walking paths of anything that might make someone trip, such as rocks or tools. Regularly check to see if handrails are loose or broken. Make sure that both sides of any steps have handrails. Any raised decks and porches should have guardrails on the edges. Have any leaves, snow, or ice cleared regularly. Use sand or salt on walking paths during winter. Clean up any spills in your garage right away. This includes oil or grease spills. What can I do in the bathroom? Use night lights. Install grab bars by the toilet and in the tub and shower. Do not use towel bars as grab bars. Use non-skid mats or decals in the tub or shower. If you need to sit down in the shower, use a plastic, non-slip stool. Keep the floor dry. Clean up any water that spills on the floor as soon as it happens. Remove soap buildup in the tub or shower regularly. Attach bath mats securely with double-sided non-slip rug tape. Do not have throw rugs and other things on the floor that can make you trip. What can I do in the bedroom? Use night lights. Make sure that you have a light by your bed that is easy to reach. Do not use any sheets or blankets that  are too big for your bed. They should not hang down onto the floor. Have a firm chair that has side arms. You can use this for support while you get dressed. Do not have throw rugs and other things on the floor that can make you trip. What can I do in the kitchen? Clean up any spills right away. Avoid walking on wet floors. Keep items that you use a lot in easy-to-reach places. If you need to reach something above you, use a strong step stool that has a grab bar. Keep electrical cords out of the way. Do not use floor polish or wax that makes floors slippery. If you must use wax, use non-skid floor wax. Do not have throw rugs and other things on the floor that can make you trip. What can I do with my stairs? Do not leave any items on the stairs. Make sure that there are handrails on both sides of the stairs and use them. Fix handrails that are broken or loose. Make sure that handrails are as long as the stairways. Check any carpeting to make sure that it is firmly attached to the stairs. Fix any carpet that is loose or worn. Avoid having throw rugs at the top or bottom of the stairs. If you do have throw rugs, attach them to the floor with carpet tape. Make sure that you have  a light switch at the top of the stairs and the bottom of the stairs. If you do not have them, ask someone to add them for you. What else can I do to help prevent falls? Wear shoes that: Do not have high heels. Have rubber bottoms. Are comfortable and fit you well. Are closed at the toe. Do not wear sandals. If you use a stepladder: Make sure that it is fully opened. Do not climb a closed stepladder. Make sure that both sides of the stepladder are locked into place. Ask someone to hold it for you, if possible. Clearly mark and make sure that you can see: Any grab bars or handrails. First and last steps. Where the edge of each step is. Use tools that help you move around (mobility aids) if they are needed. These  include: Canes. Walkers. Scooters. Crutches. Turn on the lights when you go into a dark area. Replace any light bulbs as soon as they burn out. Set up your furniture so you have a clear path. Avoid moving your furniture around. If any of your floors are uneven, fix them. If there are any pets around you, be aware of where they are. Review your medicines with your doctor. Some medicines can make you feel dizzy. This can increase your chance of falling. Ask your doctor what other things that you can do to help prevent falls. This information is not intended to replace advice given to you by your health care provider. Make sure you discuss any questions you have with your health care provider. Document Released: 10/04/2009 Document Revised: 05/15/2016 Document Reviewed: 01/12/2015 Elsevier Interactive Patient Education  2017 [employer].

## 2022-03-03 ENCOUNTER — Other Ambulatory Visit: Payer: Self-pay | Admitting: Internal Medicine

## 2022-03-03 NOTE — Telephone Encounter (Signed)
Requested medication (s) are due for refill today - no- should have 1 RF left ? ?Requested medication (s) are on the active medication list - yes ? ?Future visit scheduled -no ? ?Last refill: 12/02/21 #30 3RF ? ?Notes to clinic: Call to patient to schedule follow up appointment- patient states she does not need RF- she is not going to continue medication. Notification sent to provider as FYI ? ?Requested Prescriptions  ?Pending Prescriptions Disp Refills  ? amLODipine (NORVASC) 2.5 MG tablet [Pharmacy Med Name: AMLODIPINE BESYLATE 2.'5MG'$  TABLETS] 90 tablet   ?  Sig: TAKE 1 TABLET(2.5 MG) BY MOUTH DAILY  ?  ? Cardiovascular: Calcium Channel Blockers 2 Failed - 03/03/2022  6:10 AM  ?  ?  Failed - Last BP in normal range  ?  BP Readings from Last 1 Encounters:  ?12/02/21 (!) 169/82  ?  ?  ?  ?  Passed - Last Heart Rate in normal range  ?  Pulse Readings from Last 1 Encounters:  ?12/02/21 80  ?  ?  ?  ?  Passed - Valid encounter within last 6 months  ?  Recent Outpatient Visits   ? ?      ? 3 months ago Primary hypertension  ? [facility] Vigg, Avanti, MD  ? 4 months ago Tobacco abuse  ? [facility] Vigg, Avanti, MD  ? ?  ?  ? ?  ?  ?  ? ? ? ?Requested Prescriptions  ?Pending Prescriptions Disp Refills  ? amLODipine (NORVASC) 2.5 MG tablet [Pharmacy Med Name: AMLODIPINE BESYLATE 2.'5MG'$  TABLETS] 90 tablet   ?  Sig: TAKE 1 TABLET(2.5 MG) BY MOUTH DAILY  ?  ? Cardiovascular: Calcium Channel Blockers 2 Failed - 03/03/2022  6:10 AM  ?  ?  Failed - Last BP in normal range  ?  BP Readings from Last 1 Encounters:  ?12/02/21 (!) 169/82  ?  ?  ?  ?  Passed - Last Heart Rate in normal range  ?  Pulse Readings from Last 1 Encounters:  ?12/02/21 80  ?  ?  ?  ?  Passed - Valid encounter within last 6 months  ?  Recent Outpatient Visits   ? ?      ? 3 months ago Primary hypertension  ? [facility] Vigg, Avanti, MD  ? 4 months ago Tobacco abuse  ? [facility] Vigg, Avanti, MD  ? ?  ?  ? ?   ?  ?  ? ? ? ?

## 2022-08-19 ENCOUNTER — Encounter: Payer: Self-pay | Admitting: *Deleted

## 2022-08-19 ENCOUNTER — Other Ambulatory Visit: Payer: Self-pay

## 2022-08-19 ENCOUNTER — Ambulatory Visit
Admission: EM | Admit: 2022-08-19 | Discharge: 2022-08-19 | Disposition: A | Discharge status: Home / Self Care | Payer: Medicare Other | Attending: Family Medicine | Admitting: Family Medicine

## 2022-08-19 DIAGNOSIS — N3001 Acute cystitis with hematuria: Secondary | ICD-10-CM | POA: Diagnosis present

## 2022-08-19 LAB — URINALYSIS, ROUTINE W REFLEX MICROSCOPIC
Bilirubin Urine: NEGATIVE
Glucose, UA: NEGATIVE mg/dL
Ketones, ur: 40 mg/dL — AB
Nitrite: POSITIVE — AB
Protein, ur: 100 mg/dL — AB
Specific Gravity, Urine: 1.015 (ref 1.005–1.030)
pH: 6 (ref 5.0–8.0)

## 2022-08-19 LAB — URINALYSIS, MICROSCOPIC (REFLEX): WBC, UA: >50 WBC/hpf (ref 0–5)

## 2022-08-19 MED ORDER — CEPHALEXIN 500 MG PO CAPS: 500.0000 mg | ORAL_CAPSULE | Freq: Four times a day (QID) | ORAL | 0 refills | Status: DC

## 2022-08-19 NOTE — ED Provider Notes (Signed)
[facility]    CSN: [id number] Arrival date & time: 08/19/22  1200      History   Chief Complaint Chief Complaint  Patient presents with   Back Pain    HPI  HPI Hailey Patterson is a 66 y.o. female.   Hailey Patterson presents for central low back pain that radiates to her groin with associated suprapubic abdominal pain which started on Saturday night.  She has been taking Tylenol PM for chronic foot pain but this has not helped.  Dates that her husband had to help her out of bed.  He has history of  UTIs and kidney stones but not one in several years.  She denies injury or heavy lifting.  A couple weeks ago she was helping her husband move some plywood..  Continues to have pressure-like  pain with movement.  Feels like her legs are not weak.  Denies fever, perianal numbness, bowel or bladder incontinence.  Of note, she is having some dysuria and urinary frequency.  She says last night she was up and down most of the night and therefore was not able to sleep.  Has not had any vomiting, diarrhea.  Has had some nausea.  Has not been eating well because she does not have an appetite.  Not noticed any blood in her stool or urine.  Started to get a headache.     Fever : no  Weight loss: no Perianal numbness: no Bowel incontinence: no Bladder incontinence: no Sore throat: no   Cough: no Appetite: decreased Hydration: normal  Abdominal pain: yes Nausea: yes Vomiting: no Dysuria:yes Sleep disturbance: yes Neck Pain: no Headache: yes     Past Medical History:  Diagnosis Date   Anxiety    Breast cancer [facility])     Patient Active Problem List   Diagnosis Date Noted   Right foot pain 12/03/2021   Primary hypertension 12/02/2021   Tobacco abuse 10/31/2021   Screening for osteoporosis 10/31/2021   Chronic kidney disease 10/31/2021   Prehypertension 10/31/2021   Chronic pain of right ankle 10/31/2021   Encounter for screening mammogram for malignant neoplasm of breast  03/03/2017   Overweight (BMI 25.0-29.9) 08/17/2016   Carpal tunnel syndrome 07/03/2015   Abnormal breast finding 04/06/2015   General patient noncompliance 02/22/2015   Bunion 09/08/2014   Bilateral cataracts 09/07/2014   Breast cancer, right breast ([place]) 09/07/2014   Current tobacco use 10/14/2013   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 Only visible with one clip   BREAST LUMPECTOMY Right 2002   positive, radiation and chemo   CESAREAN SECTION     COLONOSCOPY N/A 05/01/2015   Procedure: COLONOSCOPY;  Surgeon: Josefine Class, MD;  Location: [facility] ENDOSCOPY;  Service: Endoscopy;  Laterality: N/A;   EXOSTECTECTOMY TOE Right 09/29/2016   Procedure: EXOSTECTECTOMY TOE 3RD RIGHT TOE;  Surgeon: Landis Martins, DPM;  Location: [place];  Service: Podiatry;  Laterality: Right;   FOOT SURGERY     bilateral feet   KIDNEY SURGERY     MASS EXCISION Right 09/29/2016   Procedure: EXCISION BENIGN LESION 1.0 CM SUB 3 RIGHT;  Surgeon: Landis Martins, DPM;  Location: [place];  Service: Podiatry;  Laterality: Right;   OSTECTOMY Right 09/29/2016   Procedure: BASE WEDGE OSTECTOMY FIRST RIGHT TOE;  Surgeon: Landis Martins, DPM;  Location: [place];  Service: Podiatry;  Laterality: Right;   TONSILLECTOMY      OB History   No obstetric history on file.      Home Medications    Prior to Admission medications   Medication Sig Start Date End Date Taking? Authorizing Provider  cephALEXin (KEFLEX) 500 MG capsule Take 1 capsule (500 mg total) by mouth 4 (four) times daily. 08/19/22  Yes Enza Shone, DO  amLODipine (NORVASC) 2.5 MG tablet Take 1 tablet (2.5 mg total) by mouth  daily. Patient not taking: Reported on 01/01/2022 12/02/21   Charlynne Cousins, MD  salicylic acid 17 % gel Apply topically daily. To callus area at bedtime and cover with bandaid Patient not taking: Reported on 01/01/2022 09/15/17   Landis Martins, DPM    Family History Family History  Problem Relation Age of Onset   Breast cancer Mother    Bladder Cancer Neg Hx    Kidney cancer Neg Hx     Social History Social History   Tobacco Use   Smoking status: Heavy Smoker    Packs/day: 1.00    Years: 52.00    Total pack years: 52.00    Types: Cigarettes   Smokeless tobacco: Never  Substance Use Topics   Alcohol use: No   Drug use: No     Allergies   Macrobid [nitrofurantoin], Losartan, Sulfa antibiotics, Codeine, Meperidine, and Morphine   Review of Systems Review of Systems: :negative unless otherwise stated in HPI.      Physical Exam Triage Vital Signs ED Triage Vitals  Enc Vitals Group     BP 08/19/22 1414 (!) 154/88     Pulse Rate 08/19/22 1414 97     Resp 08/19/22 1414 18     Temp 08/19/22 1414 98.6 F (37 C)     Temp src --      SpO2 08/19/22 1414 98 %     Weight --      Height --      Head Circumference --      Peak Flow --      Pain Score 08/19/22 1411 9     Pain Loc --      Pain Edu? --      Excl. in [employer]? --    No data found.  Updated Vital Signs BP (!) 154/88   Pulse 97   Temp 98.6 F (37 C)   Resp 18   SpO2 98%   Visual Acuity Right Eye Distance:   Left Eye Distance:   Bilateral Distance:    Right Eye Near:   Left Eye Near:    Bilateral Near:     Physical Exam GEN: uncomfortable appearing female in no acute distress  CVS: well perfused  RESP: speaking in full sentences without pause, no respiratory distress  ABD: Soft, mild suprapubic tenderness, bilateral CVA tenderness, no rebound, no guarding MSK:  Lumbar spine: - Inspection: no gross deformity or asymmetry, swelling or ecchymosis. No skin changes - Palpation: No TTP over the spinous  processes, paraspinal muscles, or SI joints b/l - ROM: full passive ROM of the lumbar spine in flexion and extension with pain - Strength: 5/5 strength of lower extremity i - Neuro: sensation intact in the L4-S1 nerve root distribution b/l - Special testing: Negative straight leg raise    UC Treatments /  Results  Labs (all labs ordered are listed, but only abnormal results are displayed) Labs Reviewed  URINALYSIS, ROUTINE W REFLEX MICROSCOPIC - Abnormal; Notable for the following components:      Result Value   APPearance CLOUDY (*)    Hgb urine dipstick MODERATE (*)    Ketones, ur 40 (*)    Protein, ur 100 (*)    Nitrite POSITIVE (*)    Leukocytes,Ua MODERATE (*)    All other components within normal limits  URINALYSIS, MICROSCOPIC (REFLEX) - Abnormal; Notable for the following components:   Bacteria, UA MANY (*)    All other components within normal limits  URINE CULTURE    EKG   Radiology No results found.  Procedures Procedures (including critical care time)  Medications Ordered in UC Medications - No data to display  Initial Impression / Assessment and Plan / UC Course  I have reviewed the triage vital signs and the nursing notes.  Pertinent labs & imaging results that were available during my care of the patient were reviewed by me and considered in my medical decision making (see chart for details).      Pt is a 66 y.o.  female with 3 days of  low back pain and abdominal pain.  She has dysuria and urinary urgency. She is afebrile.  Vital signs stable.  UA consistent with pyuria.  Hematuria supported on microscopy. Treat with Keflex 4 times daily for 5 days. Urine culture obtained.  Follow-up sensitivities and change antibiotics, if needed.  Return precautions including worsening abdominal pain, fever, chills, nausea, or vomiting given.   Discussed MDM, treatment plan and plan for follow-up with patient/parent who agrees with plan.    Final Clinical  Impressions(s) / UC Diagnoses   Final diagnoses:  Acute cystitis with hematuria     Discharge Instructions      You have a urinary tract infection.,  Stop by the pharmacy to pick up your prescriptions.  Be sure to take all of your antibiotics.      ED Prescriptions     Medication Sig Dispense Auth. Provider   cephALEXin (KEFLEX) 500 MG capsule Take 1 capsule (500 mg total) by mouth 4 (four) times daily. 20 capsule Lyndee Hensen, DO      PDMP not reviewed this encounter.   Lyndee Hensen, DO 08/19/22 1507

## 2022-08-19 NOTE — Discharge Instructions (Signed)
You have a urinary tract infection.,  Stop by the pharmacy to pick up your prescriptions.  Be sure to take all of your antibiotics.

## 2022-08-19 NOTE — ED Triage Notes (Signed)
Pt reports back pain that radiates to hips and legs. This started on SAt.

## 2022-08-21 LAB — URINE CULTURE: Culture: >=100000 — AB

## 2022-10-16 ENCOUNTER — Encounter: Payer: Self-pay | Admitting: Nurse Practitioner

## 2022-10-16 DIAGNOSIS — E7800 Pure hypercholesterolemia, unspecified: Secondary | ICD-10-CM | POA: Insufficient documentation

## 2022-10-16 NOTE — Patient Instructions (Signed)

## 2022-10-17 ENCOUNTER — Encounter: Payer: Self-pay | Admitting: Nurse Practitioner

## 2022-10-17 ENCOUNTER — Ambulatory Visit (INDEPENDENT_AMBULATORY_CARE_PROVIDER_SITE_OTHER): Payer: Medicare Other | Admitting: Nurse Practitioner

## 2022-10-17 VITALS — BP 129/83 | HR 96 | Temp 98.0°F | Ht 61.0 in | Wt 158.7 lb

## 2022-10-17 DIAGNOSIS — E559 Vitamin D deficiency, unspecified: Secondary | ICD-10-CM

## 2022-10-17 DIAGNOSIS — Z1159 Encounter for screening for other viral diseases: Secondary | ICD-10-CM

## 2022-10-17 DIAGNOSIS — M79644 Pain in right finger(s): Secondary | ICD-10-CM | POA: Diagnosis not present

## 2022-10-17 DIAGNOSIS — F17210 Nicotine dependence, cigarettes, uncomplicated: Secondary | ICD-10-CM

## 2022-10-17 DIAGNOSIS — E7800 Pure hypercholesterolemia, unspecified: Secondary | ICD-10-CM

## 2022-10-17 DIAGNOSIS — I10 Essential (primary) hypertension: Secondary | ICD-10-CM

## 2022-10-17 DIAGNOSIS — G8929 Other chronic pain: Secondary | ICD-10-CM

## 2022-10-17 DIAGNOSIS — Z91199 Patient's noncompliance with other medical treatment and regimen due to unspecified reason: Secondary | ICD-10-CM | POA: Diagnosis not present

## 2022-10-17 DIAGNOSIS — Z853 Personal history of malignant neoplasm of breast: Secondary | ICD-10-CM

## 2022-10-17 DIAGNOSIS — Z6829 Body mass index (BMI) 29.0-29.9, adult: Secondary | ICD-10-CM

## 2022-10-17 NOTE — Assessment & Plan Note (Signed)
Noted on past labs with ASCVD 18%, she refuses statin therapy.  At length discussion. Reports not liking to put unknown chemicals in her body..  Recheck labs today and continue to recommend statin therapy, as higher risk due to smoking and elevations.

## 2022-10-17 NOTE — Progress Notes (Signed)
BP 129/83   Pulse 96   Temp 98 F (36.7 C) (Oral)   Ht '5\' 1"'$  (1.549 m)   Wt 158 lb 11.2 oz (72 kg)   SpO2 97%   BMI 29.99 kg/m    Subjective:    Patient ID: Hailey Patterson, female    DOB: 03-30-1956, 66 y.o.   MRN: [id number]  HPI: Hailey Patterson is a 66 y.o. female  Chief Complaint  Patient presents with   Hypertension   Breast Cancer   Vitamin D   Cyst    Patient would like to see if the provider could take a look at some knots she noticed on her hands.    HYPERTENSION / HYPERLIPIDEMIA Never took Amlodipine -- she does not like to take medication.  Not currently taking statin, prefers focus on natural things.  Her son is no longer living with her and stressors have decreased.  Does not get vaccinations, has not had good experiences with providers in past she reports and does not trust vaccines.  She is a smoker, smokes one pack per day.  Has smoked since her teen years, about 75 years.  Not interested in quitting at this time. Aspirin: no Recent stressors: no Recurrent headaches: no Visual changes: no Palpitations: no Dyspnea: no Chest pain: no Lower extremity edema: no Dizzy/lightheaded:  occasional  due to eyes The 10-year ASCVD risk score (Arnett DK, et al., 2019) is: 13.6%   Values used to calculate the score:     Age: 57 years     Sex: Female     Is Non-Hispanic African American: No     Diabetic: No     Tobacco smoker: Yes     Systolic Blood Pressure: 902 mmHg     Is BP treated: No     HDL Cholesterol: 46 mg/dL     Total Cholesterol: 196 mg/dL  BREAST CANCER: History of breast cancer, went through chemo and radiation.  Does not follow with oncology anymore, reports they dismissed her as she refused to take the oral medication for 5 years.  Has not seen them in several years.  Last mammogram 12/24/21 which was normal.  History of Vitamin D levels low, not taking supplement.  Does not want to get DEXA scan, refuses it after much conversation.  SKIN CYST AND  RIGHT THUMB PAIN Present to right wrist -- went to ortho ([facility]) in past for swelling in thumb -- he did not see bumps she reports.  She is right handed. Right side is the area she had breast cancer.   Duration:  year Location: to right wrist Painful: yes Itching: no Onset: gradual Context: not changing Associated signs and symptoms: discomfort up into thumb History of skin cancer: no History of precancerous skin lesions: no Family history of skin cancer: no   Relevant past medical, surgical, family and social history reviewed and updated as indicated. Interim medical history since our last visit reviewed. Allergies and medications reviewed and updated.  Review of Systems  Constitutional:  Negative for activity change, appetite change, diaphoresis, fatigue and fever.  Respiratory:  Negative for cough, chest tightness and shortness of breath.   Cardiovascular:  Negative for chest pain, palpitations and leg swelling.  Gastrointestinal: Negative.   Musculoskeletal:  Positive for arthralgias.  Neurological: Negative.   Psychiatric/Behavioral: Negative.      Per HPI unless specifically indicated above     Objective:    BP 129/83   Pulse 96   Temp 98  F (36.7 C) (Oral)   Ht '5\' 1"'$  (1.549 m)   Wt 158 lb 11.2 oz (72 kg)   SpO2 97%   BMI 29.99 kg/m   Wt Readings from Last 3 Encounters:  10/17/22 158 lb 11.2 oz (72 kg)  12/02/21 165 lb 3.2 oz (74.9 kg)  10/31/21 (P) 163 lb 6.4 oz (74.1 kg)    Physical Exam Vitals and nursing note reviewed.  Constitutional:      General: She is awake. She is not in acute distress.    Appearance: She is well-developed and well-groomed. She is not ill-appearing or toxic-appearing.  HENT:     Head: Normocephalic.     Right Ear: Hearing and external ear normal.     Left Ear: Hearing and external ear normal.  Eyes:     General: Lids are normal.        Right eye: No discharge.        Left eye: No discharge.     Conjunctiva/sclera:  Conjunctivae normal.     Pupils: Pupils are equal, round, and reactive to light.  Neck:     Thyroid: No thyromegaly.     Vascular: No carotid bruit.  Cardiovascular:     Rate and Rhythm: Normal rate and regular rhythm.     Heart sounds: Normal heart sounds. No murmur heard.    No gallop.  Pulmonary:     Effort: Pulmonary effort is normal. No accessory muscle usage or respiratory distress.     Breath sounds: Normal breath sounds.  Abdominal:     General: Bowel sounds are normal.     Palpations: Abdomen is soft.  Musculoskeletal:     Right hand: Swelling (around thumb area) and tenderness (around thumb area wrist) present. No lacerations or bony tenderness. Normal range of motion. Normal strength. Normal sensation. Normal pulse.     Left hand: Normal.       Arms:     Cervical back: Normal range of motion and neck supple.     Right lower leg: No edema.     Left lower leg: No edema.  Lymphadenopathy:     Cervical: No cervical adenopathy.  Skin:    General: Skin is warm and dry.  Neurological:     Mental Status: She is alert and oriented to person, place, and time.  Psychiatric:        Attention and Perception: Attention normal.        Mood and Affect: Mood normal.        Speech: Speech normal.        Behavior: Behavior normal. Behavior is cooperative.        Thought Content: Thought content normal.    Results for orders placed or performed during the hospital encounter of 08/19/22  Urine Culture   Specimen: Urine, Clean Catch  Result Value Ref Range   Specimen Description      URINE, CLEAN CATCH Performed at [facility], [address] [place], [place] [address]    Special Requests      NONE Performed at [facility] [address] [place], [place] [address]    Culture >=100,000 COLONIES/mL ESCHERICHIA COLI (A)    Report Status 08/21/2022 FINAL    Organism ID, Bacteria ESCHERICHIA COLI (A)       Susceptibility   Escherichia coli - MIC*     AMPICILLIN <=2 SENSITIVE Sensitive     CEFAZOLIN <=4 SENSITIVE Sensitive     CEFEPIME <=0.12 SENSITIVE Sensitive  CEFTRIAXONE <=0.25 SENSITIVE Sensitive     CIPROFLOXACIN <=0.25 SENSITIVE Sensitive     GENTAMICIN <=1 SENSITIVE Sensitive     IMIPENEM <=0.25 SENSITIVE Sensitive     NITROFURANTOIN <=16 SENSITIVE Sensitive     TRIMETH/SULFA <=20 SENSITIVE Sensitive     AMPICILLIN/SULBACTAM <=2 SENSITIVE Sensitive     PIP/TAZO <=4 SENSITIVE Sensitive     * >=100,000 COLONIES/mL ESCHERICHIA COLI  Urinalysis, Routine w reflex microscopic Urine, Clean Catch  Result Value Ref Range   Color, Urine YELLOW YELLOW   APPearance CLOUDY (A) CLEAR   Specific Gravity, Urine 1.015 1.005 - 1.030   pH 6.0 5.0 - 8.0   Glucose, UA NEGATIVE NEGATIVE mg/dL   Hgb urine dipstick MODERATE (A) NEGATIVE   Bilirubin Urine NEGATIVE NEGATIVE   Ketones, ur 40 (A) NEGATIVE mg/dL   Protein, ur 100 (A) NEGATIVE mg/dL   Nitrite POSITIVE (A) NEGATIVE   Leukocytes,Ua MODERATE (A) NEGATIVE  Urinalysis, Microscopic (reflex)  Result Value Ref Range   RBC / HPF 11-20 0 - 5 RBC/hpf   WBC, UA >50 0 - 5 WBC/hpf   Bacteria, UA MANY (A) NONE SEEN   Squamous Epithelial / LPF 0-5 0 - 5   WBC Clumps PRESENT       Assessment & Plan:   Problem List Items Addressed This Visit       Cardiovascular and Mediastinum   Primary hypertension - Primary    Chronic, stable on check today without medication.  Will continue diet focus and recommend regular exercise 30 minutes 5 days a week.  Recommend complete cessation of smoking.  Recommend she monitor BP at least a few mornings a week at home and document.  DASH diet at home.  Labs today: CBC, CMP, TSH.  Return in 6 months.       Relevant Orders   CBC with Differential/Platelet   Comprehensive metabolic panel   TSH     Other   BMI 29.0-29.9,adult    BMI 29.99 Recommended eating smaller high protein, low fat meals more frequently and exercising 30 mins a day 5 times a week  with a goal of 10-15lb weight loss in the next 3 months. Patient voiced their understanding and motivation to adhere to these recommendations.       Chronic pain of right thumb    Ongoing with small cystic like area noted to right wrist, remainder of areas of concern are blood vessels that are more prominent.  Educated her on this.  Due to ongoing pain and possible cyst, will place new referral to ortho.  She would prefer [facility] due to experience at [facility].      Relevant Orders   Ambulatory referral to Orthopedics   Elevated low density lipoprotein (LDL) cholesterol level    Noted on past labs with ASCVD 18%, she refuses statin therapy.  At length discussion. Reports not liking to put unknown chemicals in her body..  Recheck labs today and continue to recommend statin therapy, as higher risk due to smoking and elevations.      Relevant Orders   Lipid Panel w/o Chol/HDL Ratio   General patient noncompliance    Refuses all vaccinations and preventative care like DEXA.      History of breast cancer    Diagnosed in 2002 and completed chemotherapy and radiation therapy + surgery.  Continue annual mammograms, this was recommended but she states she may get them every 2 years.      Nicotine dependence, cigarettes, uncomplicated  I have recommended complete cessation of tobacco use. I have discussed various options available for assistance with tobacco cessation including over the counter methods (Nicotine gum, patch and lozenges). We also discussed prescription options (Chantix, Nicotine Inhaler / Nasal Spray). The patient is not interested in pursuing any prescription tobacco cessation options at this time.  Recommend CT lung cancer screening, she is interested in this -- referral placed.      Relevant Orders   Ambulatory Referral Lung Cancer Screening [facility]   Other Visit Diagnoses     Vitamin D deficiency       History of low levels, smoker and refuses DEXA, will  check today and start supplement as needed.  Continue to recommend DEXA.   Relevant Orders   VITAMIN D 25 Hydroxy (Vit-D Deficiency, Fractures)   Need for hepatitis C screening test       Hep C screen on labs today per guidelines for one time screening, discussed with patient.   Relevant Orders   Hepatitis C antibody        Follow up plan: Return in about 6 months (around 04/18/2023) for HTN/HLD, BREAST CA.

## 2022-10-17 NOTE — Assessment & Plan Note (Signed)
BMI 29.99 Recommended eating smaller high protein, low fat meals more frequently and exercising 30 mins a day 5 times a week with a goal of 10-15lb weight loss in the next 3 months. Patient voiced their understanding and motivation to adhere to these recommendations.

## 2022-10-17 NOTE — Assessment & Plan Note (Signed)
Ongoing with small cystic like area noted to right wrist, remainder of areas of concern are blood vessels that are more prominent.  Educated her on this.  Due to ongoing pain and possible cyst, will place new referral to ortho.  She would prefer [facility] due to experience at [facility].

## 2022-10-17 NOTE — Assessment & Plan Note (Signed)
Refuses all vaccinations and preventative care like DEXA.

## 2022-10-17 NOTE — Assessment & Plan Note (Signed)
Chronic, stable on check today without medication.  Will continue diet focus and recommend regular exercise 30 minutes 5 days a week.  Recommend complete cessation of smoking.  Recommend she monitor BP at least a few mornings a week at home and document.  DASH diet at home.  Labs today: CBC, CMP, TSH.  Return in 6 months.

## 2022-10-17 NOTE — Assessment & Plan Note (Signed)
I have recommended complete cessation of tobacco use. I have discussed various options available for assistance with tobacco cessation including over the counter methods (Nicotine gum, patch and lozenges). We also discussed prescription options (Chantix, Nicotine Inhaler / Nasal Spray). The patient is not interested in pursuing any prescription tobacco cessation options at this time.  Recommend CT lung cancer screening, she is interested in this -- referral placed.

## 2022-10-17 NOTE — Assessment & Plan Note (Signed)
Diagnosed in 2002 and completed chemotherapy and radiation therapy + surgery.  Continue annual mammograms, this was recommended but she states she may get them every 2 years.

## 2022-10-18 LAB — CBC WITH DIFFERENTIAL/PLATELET
Basophils Absolute: 0 10*3/uL (ref 0.0–0.2)
Basos: 1 %
EOS (ABSOLUTE): 0.1 10*3/uL (ref 0.0–0.4)
Eos: 2 %
Hematocrit: 40.5 % (ref 34.0–46.6)
Hemoglobin: 13.5 g/dL (ref 11.1–15.9)
Immature Grans (Abs): 0 10*3/uL (ref 0.0–0.1)
Immature Granulocytes: 0 %
Lymphocytes Absolute: 3.2 10*3/uL — ABNORMAL HIGH (ref 0.7–3.1)
Lymphs: 43 %
MCH: 31.8 pg (ref 26.6–33.0)
MCHC: 33.3 g/dL (ref 31.5–35.7)
MCV: 95 fL (ref 79–97)
Monocytes Absolute: 0.5 10*3/uL (ref 0.1–0.9)
Monocytes: 6 %
Neutrophils Absolute: 3.6 10*3/uL (ref 1.4–7.0)
Neutrophils: 48 %
Platelets: 339 10*3/uL (ref 150–450)
RBC: 4.25 x10E6/uL (ref 3.77–5.28)
RDW: 13.7 % (ref 11.7–15.4)
WBC: 7.5 10*3/uL (ref 3.4–10.8)

## 2022-10-18 LAB — COMPREHENSIVE METABOLIC PANEL
ALT: 15 IU/L (ref 0–32)
AST: 13 IU/L (ref 0–40)
Albumin/Globulin Ratio: 2 (ref 1.2–2.2)
Albumin: 4.4 g/dL (ref 3.9–4.9)
Alkaline Phosphatase: 81 IU/L (ref 44–121)
BUN/Creatinine Ratio: 12 (ref 12–28)
BUN: 11 mg/dL (ref 8–27)
Bilirubin Total: <0.2 mg/dL (ref 0.0–1.2)
CO2: 24 mmol/L (ref 20–29)
Calcium: 9.3 mg/dL (ref 8.7–10.3)
Chloride: 101 mmol/L (ref 96–106)
Creatinine, Ser: 0.91 mg/dL (ref 0.57–1.00)
Globulin, Total: 2.2 g/dL (ref 1.5–4.5)
Glucose: 106 mg/dL — ABNORMAL HIGH (ref 70–99)
Potassium: 3.6 mmol/L (ref 3.5–5.2)
Sodium: 141 mmol/L (ref 134–144)
Total Protein: 6.6 g/dL (ref 6.0–8.5)
eGFR: 69 mL/min/{1.73_m2} (ref >59)

## 2022-10-18 LAB — LIPID PANEL W/O CHOL/HDL RATIO
Cholesterol, Total: 203 mg/dL — ABNORMAL HIGH (ref 100–199)
HDL: 39 mg/dL — ABNORMAL LOW (ref >39)
LDL Chol Calc (NIH): 117 mg/dL — ABNORMAL HIGH (ref 0–99)
Triglycerides: 271 mg/dL — ABNORMAL HIGH (ref 0–149)
VLDL Cholesterol Cal: 47 mg/dL — ABNORMAL HIGH (ref 5–40)

## 2022-10-18 LAB — VITAMIN D 25 HYDROXY (VIT D DEFICIENCY, FRACTURES): Vit D, 25-Hydroxy: 26.1 ng/mL — ABNORMAL LOW (ref 30.0–100.0)

## 2022-10-18 LAB — TSH: TSH: 2.31 u[IU]/mL (ref 0.450–4.500)

## 2022-10-18 LAB — HEPATITIS C ANTIBODY: Hep C Virus Ab: NONREACTIVE

## 2022-10-19 ENCOUNTER — Encounter: Payer: Self-pay | Admitting: Nurse Practitioner

## 2022-10-19 DIAGNOSIS — R7301 Impaired fasting glucose: Secondary | ICD-10-CM | POA: Insufficient documentation

## 2022-10-19 NOTE — Progress Notes (Signed)
Good morning, please let Hailey Patterson know her labs have returned: - CBC is stable with no infection or anemia. - Kidney and liver function are in normal ranges.  Glucose (sugar) a tad elevated, will recheck this next visit to ensure no diabetes or prediabetes. - Cholesterol levels are quite elevated, I know you stated wishes not to take medication, but I do highly recommend it -- I recommend a statin to prevent heart attack or stroke.  You are at higher risk for this.  Do you want me to send in a low dose of Rosuvastatin to trial? - Vitamin D level is low, I recommend starting over the counter Vitamin D3 2000 units daily for bone health. - Remainder of labs stable including normal thyroid and negative Hepatitis C. Any questions? Keep being amazing!!  Thank you for allowing me to participate in your care.  I appreciate you. Kindest regards, Vivika Poythress

## 2022-12-05 DIAGNOSIS — M25531 Pain in right wrist: Secondary | ICD-10-CM | POA: Diagnosis not present

## 2022-12-05 DIAGNOSIS — M67431 Ganglion, right wrist: Secondary | ICD-10-CM | POA: Diagnosis not present

## 2022-12-05 DIAGNOSIS — G8929 Other chronic pain: Secondary | ICD-10-CM | POA: Diagnosis not present

## 2022-12-05 DIAGNOSIS — M79644 Pain in right finger(s): Secondary | ICD-10-CM | POA: Diagnosis not present

## 2022-12-08 ENCOUNTER — Other Ambulatory Visit: Payer: Self-pay | Admitting: Orthopedic Surgery

## 2022-12-08 DIAGNOSIS — M25531 Pain in right wrist: Secondary | ICD-10-CM

## 2022-12-08 DIAGNOSIS — G8929 Other chronic pain: Secondary | ICD-10-CM

## 2022-12-08 DIAGNOSIS — M67431 Ganglion, right wrist: Secondary | ICD-10-CM

## 2022-12-09 DIAGNOSIS — G8929 Other chronic pain: Secondary | ICD-10-CM | POA: Diagnosis not present

## 2022-12-09 DIAGNOSIS — L84 Corns and callosities: Secondary | ICD-10-CM | POA: Diagnosis not present

## 2022-12-09 DIAGNOSIS — M79671 Pain in right foot: Secondary | ICD-10-CM | POA: Diagnosis not present

## 2022-12-09 DIAGNOSIS — L851 Acquired keratosis [keratoderma] palmaris et plantaris: Secondary | ICD-10-CM | POA: Diagnosis not present

## 2022-12-09 DIAGNOSIS — L603 Nail dystrophy: Secondary | ICD-10-CM | POA: Diagnosis not present

## 2022-12-09 DIAGNOSIS — M2041 Other hammer toe(s) (acquired), right foot: Secondary | ICD-10-CM | POA: Diagnosis not present

## 2022-12-09 DIAGNOSIS — M19071 Primary osteoarthritis, right ankle and foot: Secondary | ICD-10-CM | POA: Diagnosis not present

## 2022-12-09 DIAGNOSIS — M19072 Primary osteoarthritis, left ankle and foot: Secondary | ICD-10-CM | POA: Diagnosis not present

## 2022-12-09 DIAGNOSIS — M79672 Pain in left foot: Secondary | ICD-10-CM | POA: Diagnosis not present

## 2022-12-09 DIAGNOSIS — M2042 Other hammer toe(s) (acquired), left foot: Secondary | ICD-10-CM | POA: Diagnosis not present

## 2022-12-09 DIAGNOSIS — Z87891 Personal history of nicotine dependence: Secondary | ICD-10-CM | POA: Diagnosis not present

## 2022-12-11 ENCOUNTER — Other Ambulatory Visit: Payer: Self-pay | Admitting: *Deleted

## 2022-12-11 DIAGNOSIS — Z122 Encounter for screening for malignant neoplasm of respiratory organs: Secondary | ICD-10-CM

## 2022-12-11 DIAGNOSIS — Z87891 Personal history of nicotine dependence: Secondary | ICD-10-CM

## 2022-12-11 DIAGNOSIS — F17210 Nicotine dependence, cigarettes, uncomplicated: Secondary | ICD-10-CM

## 2022-12-18 ENCOUNTER — Ambulatory Visit
Admission: RE | Admit: 2022-12-18 | Discharge: 2022-12-18 | Disposition: A | Discharge status: Home / Self Care | Payer: Medicare Other | Source: Ambulatory Visit | Attending: Orthopedic Surgery | Admitting: Orthopedic Surgery

## 2022-12-18 DIAGNOSIS — M67431 Ganglion, right wrist: Secondary | ICD-10-CM | POA: Diagnosis not present

## 2022-12-18 DIAGNOSIS — M79644 Pain in right finger(s): Secondary | ICD-10-CM | POA: Insufficient documentation

## 2022-12-18 DIAGNOSIS — M25531 Pain in right wrist: Secondary | ICD-10-CM | POA: Diagnosis not present

## 2022-12-18 DIAGNOSIS — M65841 Other synovitis and tenosynovitis, right hand: Secondary | ICD-10-CM | POA: Diagnosis not present

## 2022-12-18 DIAGNOSIS — G8929 Other chronic pain: Secondary | ICD-10-CM | POA: Diagnosis not present

## 2022-12-18 DIAGNOSIS — M19031 Primary osteoarthritis, right wrist: Secondary | ICD-10-CM | POA: Diagnosis not present

## 2022-12-18 DIAGNOSIS — I868 Varicose veins of other specified sites: Secondary | ICD-10-CM | POA: Diagnosis not present

## 2022-12-18 DIAGNOSIS — M25431 Effusion, right wrist: Secondary | ICD-10-CM | POA: Diagnosis not present

## 2023-01-21 ENCOUNTER — Encounter: Payer: Medicare Other | Admitting: Acute Care

## 2023-01-21 ENCOUNTER — Ambulatory Visit: Payer: 59

## 2023-02-03 ENCOUNTER — Ambulatory Visit (INDEPENDENT_AMBULATORY_CARE_PROVIDER_SITE_OTHER): Payer: 59

## 2023-02-03 VITALS — Wt 158.0 lb

## 2023-02-03 DIAGNOSIS — Z0000 Encounter for general adult medical examination without abnormal findings: Secondary | ICD-10-CM

## 2023-02-03 NOTE — Progress Notes (Signed)
I connected with  Hailey Patterson on 02/03/23 by a audio enabled telemedicine application and verified that I am speaking with the correct person using two identifiers.  Patient Location: Home  Provider Location: Home Office  I discussed the limitations of evaluation and management by telemedicine. The patient expressed understanding and agreed to proceed.  Subjective:   Hailey Patterson is a 67 y.o. female who presents for Medicare Annual (Subsequent) preventive examination.  Review of Systems     Cardiac Risk Factors include: advanced age (>38mn, >>76women);smoking/ tobacco exposure     Objective:    There were no vitals filed for this visit. There is no height or weight on file to calculate BMI.     02/03/2023    1:13 PM 01/01/2022    8:22 AM 11/12/2020    2:39 PM 09/29/2016   12:12 PM 09/24/2016   10:15 AM 05/01/2015    7:39 AM 05/01/2015    7:35 AM  Advanced Directives  Does Patient Have a Medical Advance Directive? No No No No No No No  Would patient like information on creating a medical advance directive? No - Patient declined No - Patient declined  Yes - Educational materials given  No - patient declined information     Current Medications (verified) No outpatient encounter medications on file as of 02/03/2023.   Facility-Administered Encounter Medications as of 02/03/2023  Medication   triamcinolone acetonide (KENALOG) 10 MG/ML injection 10 mg   triamcinolone acetonide (KENALOG) 10 MG/ML injection 10 mg    Allergies (verified) Macrobid [nitrofurantoin], Losartan, Sulfa antibiotics, Codeine, Meperidine, and Morphine   History: Past Medical History:  Diagnosis Date   Anxiety    Breast cancer ([facility]    Past Surgical History:  Procedure Laterality Date   ABDOMINAL HYSTERECTOMY     BREAST BIOPSY Right 2002   positive biopsy   BREAST BIOPSY Right 2006   MRI biopsies X 2 areas both 12:00 (subareolar area and mid position), benign. Only visible with one clip    BREAST LUMPECTOMY Right 2002   positive, radiation and chemo   CESAREAN SECTION     COLONOSCOPY N/A 05/01/2015   Procedure: COLONOSCOPY;  Surgeon: MJosefine Class MD;  [facility]  Service: Endoscopy;  Laterality: N/A;   EXOSTECTECTOMY TOE Right 09/29/2016   Procedure: EXOSTECTECTOMY TOE 3RD RIGHT TOE;  Surgeon: TLandis Martins DPM;  [place] [facility]  Service: [place]iatry;  Laterality: Right;   FOOT SURGERY     bilateral feet   KIDNEY SURGERY     MASS EXCISION Right 09/29/2016   Procedure: EXCISION BENIGN LESION 1.0 CM SUB 3 RIGHT;  Surgeon: TLandis Martins DPM;  [place] [facility]  Service: Podiatry;  Laterality: Right;   OSTECTOMY Right 09/29/2016   Procedure: BASE WEDGE OSTECTOMY FIRST RIGHT TOE;  Surgeon: TLandis Martins DPM;  [place] [facility]  Service: Podiatry;  Laterality: Right;   TONSILLECTOMY     Family History  Problem Relation Age of Onset   Breast cancer Mother    Bladder Cancer Neg Hx    Kidney cancer Neg Hx    Social History   Socioeconomic History   Marital status: Married    Spouse name: Not on file   Number of children: Not on file   Years of education: Not on file   Highest education level: Not on file  Occupational History   Not on file  Tobacco Use   Smoking status: Heavy Smoker  Packs/day: 1.00    Years: 52.00    Total pack years: 52.00    Types: Cigarettes   Smokeless tobacco: Never  Substance and Sexual Activity   Alcohol use: No   Drug use: No   Sexual activity: Not Currently  Other Topics Concern   Not on file  Social History Narrative   Not on file   Social Determinants of Health   Financial Resource Strain: Low Risk  (02/03/2023)   Overall Financial Resource Strain (CARDIA)    Difficulty of Paying Living Expenses: Not hard at all  Food Insecurity: No Food Insecurity (02/03/2023)   Hunger Vital Sign    Worried About Running Out of Food in the Last Year: Never  true    Ran Out of Food in the Last Year: Never true  Transportation Needs: No Transportation Needs (02/03/2023)   PRAPARE - Hydrologist (Medical): No    Lack of Transportation (Non-Medical): No  Physical Activity: Sufficiently Active (02/03/2023)   Exercise Vital Sign    Days of Exercise per Week: 7 days    Minutes of Exercise per Session: 60 min  Stress: No Stress Concern Present (02/03/2023)   [place]    Feeling of Stress : Not at all  Social Connections: Moderately Isolated (02/03/2023)   Social Connection and Isolation Panel [NHANES]    Frequency of Communication with Friends and Family: More than three times a week    Frequency of Social Gatherings with Friends and Family: More than three times a week    Attends Religious Services: Never    Marine scientist or Organizations: No    Attends Music therapist: Never    Marital Status: Married    Tobacco Counseling Ready to quit: Not Answered Counseling given: Not Answered   Clinical Intake:  Pre-visit preparation completed: Yes  Pain : No/denies pain     Diabetes: No  How often do you need to have someone help you when you read instructions, pamphlets, or other written materials from your doctor or pharmacy?: 1 - Never  Diabetic?no  Interpreter Needed?: No  Information entered by :: Hailey Shaggy, LPN   Activities of Daily Living    02/03/2023    1:14 PM  In your present state of health, do you have any difficulty performing the following activities:  Hearing? 0  Vision? 0  Difficulty concentrating or making decisions? 0  Walking or climbing stairs? 1  Dressing or bathing? 0  Doing errands, shopping? 0  Preparing Food and eating ? N  Using the Toilet? N  In the past six months, have you accidently leaked urine? N  Do you have problems with loss of bowel control? N  Managing your Medications? N   Managing your Finances? N  Housekeeping or managing your Housekeeping? N    Patient Care Team: Hailey Lick, NP as PCP - General (Nurse Practitioner)  Indicate any recent Medical Services you may have received from other than [facility] providers in the past year (date may be approximate).     Assessment:   This is a routine wellness examination for [name].  Hearing/Vision screen Hearing Screening - Comments:: No aids Vision Screening - Comments:: No glasses  Dietary issues and exercise activities discussed: Current Exercise Habits: Home exercise routine, Type of exercise: walking, Time (Minutes): 60, Frequency (Times/Week): 7, Weekly Exercise (Minutes/Week): 420, Intensity: Mild   Goals Addressed  This Visit's Progress    DIET - EAT MORE FRUITS AND VEGETABLES         Depression Screen    02/03/2023    1:10 PM 02/03/2023    1:09 PM 01/01/2022    8:34 AM 12/02/2021    3:13 PM 10/31/2021    1:39 PM  PHQ 2/9 Scores  PHQ - 2 Score 0 0 0 0 0  PHQ- 9 Score 0 0 0 0 0    Fall Risk    02/03/2023    1:14 PM 01/01/2022    8:19 AM 12/02/2021    3:13 PM 10/31/2021    1:38 PM  [place] in the past year? 0 0 0 0  Number falls in past yr: 0 0 0 0  Injury with Fall? 0 0 0 0  Risk for fall due to : No Fall Risks  No Fall Risks   Follow up Falls prevention discussed;Falls evaluation completed Falls evaluation completed;Falls prevention discussed Falls evaluation completed Falls evaluation completed    FALL RISK PREVENTION PERTAINING TO THE HOME:  Any stairs in or around the home? No  If so, are there any without handrails? No  Home free of loose throw rugs in walkways, pet beds, electrical cords, etc? Yes  Adequate lighting in your home to reduce risk of falls? Yes   ASSISTIVE DEVICES UTILIZED TO PREVENT FALLS:  Life alert? No  Use of a cane, walker or w/c? No  Grab bars in the bathroom? No  Shower chair or bench in shower? No  Elevated toilet seat  or a handicapped toilet? No   Cognitive Function:declined memory test        Immunizations Immunization History  Administered Date(s) Administered   Influenza,inj,Quad PF,6+ Mos 09/07/2014, 01/10/2016   Influenza-Unspecified 09/07/2014, 01/10/2016   Pneumococcal Polysaccharide-23 05/23/2015   Td 09/19/2002   Td (Adult), 2 Lf Tetanus Toxid, Preservative Free 09/19/2002   Tdap 09/07/2014    TDAP status: Up to date  Flu Vaccine status: Declined, Education has been provided regarding the importance of this vaccine but patient still declined. Advised may receive this vaccine at local pharmacy or Health Dept. Aware to provide a copy of the vaccination record if obtained from local pharmacy or Health Dept. Verbalized acceptance and understanding.  Pneumococcal vaccine status: Declined,  Education has been provided regarding the importance of this vaccine but patient still declined. Advised may receive this vaccine at local pharmacy or Health Dept. Aware to provide a copy of the vaccination record if obtained from local pharmacy or Health Dept. Verbalized acceptance and understanding.   Covid-19 vaccine status: Declined, Education has been provided regarding the importance of this vaccine but patient still declined. Advised may receive this vaccine at local pharmacy or Health Dept.or vaccine clinic. Aware to provide a copy of the vaccination record if obtained from local pharmacy or Health Dept. Verbalized acceptance and understanding.  Qualifies for Shingles Vaccine? Yes   Zostavax completed No   Shingrix Completed?: No.    Education has been provided regarding the importance of this vaccine. Patient has been advised to call insurance company to determine out of pocket expense if they have not yet received this vaccine. Advised may also receive vaccine at local pharmacy or Health Dept. Verbalized acceptance and understanding.  Screening Tests Health Maintenance  Topic Date Due   COVID-19  Vaccine (1) Never done   Lung Cancer Screening  Never done   Zoster Vaccines- Shingrix (1 of 2) Never done  Pneumonia Vaccine 43+ Years old (2 of 2 - PCV) 05/22/2016   DEXA SCAN  Never done   INFLUENZA VACCINE  03/22/2023 (Originally 07/22/2022)   MAMMOGRAM  12/25/2023   Medicare Annual Wellness (AWV)  02/04/2024   DTaP/Tdap/Td (4 - Td or Tdap) 09/07/2024   COLONOSCOPY (Pts 45-92yr Insurance coverage will need to be confirmed)  04/30/2025   Hepatitis C Screening  Completed   HPV VACCINES  Aged Out    Health Maintenance  Health Maintenance Due  Topic Date Due   COVID-19 Vaccine (1) Never done   Lung Cancer Screening  Never done   Zoster Vaccines- Shingrix (1 of 2) Never done   Pneumonia Vaccine 69 Years old (2 of 2 - PCV) 05/22/2016   DEXA SCAN  Never done    Colorectal cancer screening: Type of screening: Colonoscopy. Completed 05/01/15. Repeat every 10 years  Mammogram status: Completed 12/24/21. Repeat every year- declined referral  [facility] referral declined  Lung Cancer Screening: (Low Dose CT Chest recommended if Age 69-80years, 30 pack-year currently smoking OR have quit w/in 15years.) does qualify.   Lung Cancer Screening Referral: declined referral  Additional Screening:  Hepatitis C Screening: does qualify; Completed 10/17/22  Vision Screening: Recommended annual ophthalmology exams for early detection of glaucoma and other disorders of the eye. Is the patient up to date with their annual eye exam?  No  Who is the provider or what is the name of the office in which the patient attends annual eye exams? No one If pt is not established with a provider, would they like to be referred to a provider to establish care? No .   Dental Screening: Recommended annual dental exams for proper oral hygiene  Community Resource Referral / Chronic Care Management: CRR required this visit?  No   CCM required this visit?  No      Plan:     I have personally reviewed and noted  the following in the patient's chart:   Medical and social history Use of alcohol, tobacco or illicit drugs  Current medications and supplements including opioid prescriptions. Patient is not currently taking opioid prescriptions. Functional ability and status Nutritional status Physical activity Advanced directives List of other physicians Hospitalizations, surgeries, and ER visits in previous 12 months Vitals Screenings to include cognitive, depression, and falls Referrals and appointments  In addition, I have reviewed and discussed with patient certain preventive protocols, quality metrics, and best practice recommendations. A written personalized care plan for preventive services as well as general preventive health recommendations were provided to patient.     LDionisio David LPN   [id number]  Nurse Notes: none

## 2023-02-03 NOTE — Patient Instructions (Addendum)
Hailey Patterson , Thank you for taking time to come for your Medicare Wellness Visit. I appreciate your ongoing commitment to your health goals. Please review the following plan we discussed and let me know if I can assist you in the future.   These are the goals we discussed:  Goals      DIET - EAT MORE FRUITS AND VEGETABLES     Patient Stated     No goals        This is a list of the screening recommended for you and due dates:  Health Maintenance  Topic Date Due   COVID-19 Vaccine (1) Never done   Screening for Lung Cancer  Never done   Zoster (Shingles) Vaccine (1 of 2) Never done   Pneumonia Vaccine (2 of 2 - PCV) 05/22/2016   DEXA scan (bone density measurement)  Never done   Flu Shot  03/22/2023*   Mammogram  12/25/2023   Medicare Annual Wellness Visit  02/04/2024   DTaP/Tdap/Td vaccine (4 - Td or Tdap) 09/07/2024   Colon Cancer Screening  04/30/2025   Hepatitis C Screening: USPSTF Recommendation to screen - Ages 18-79 yo.  Completed   HPV Vaccine  Aged Out  *Topic was postponed. The date shown is not the original due date.    Advanced directives: no  Conditions/risks identified: none  Next appointment: Follow up in one year for your annual wellness visit - declined appt   Preventive Care 65 Years and Older, Female Preventive care refers to lifestyle choices and visits with your health care provider that can promote health and wellness. What does preventive care include? A yearly physical exam. This is also called an annual well check. Dental exams once or twice a year. Routine eye exams. Ask your health care provider how often you should have your eyes checked. Personal lifestyle choices, including: Daily care of your teeth and gums. Regular physical activity. Eating a healthy diet. Avoiding tobacco and drug use. Limiting alcohol use. Practicing safe sex. Taking low-dose aspirin every day. Taking vitamin and mineral supplements as recommended by your health  care provider. What happens during an annual well check? The services and screenings done by your health care provider during your annual well check will depend on your age, overall health, lifestyle risk factors, and family history of disease. Counseling  Your health care provider may ask you questions about your: Alcohol use. Tobacco use. Drug use. Emotional well-being. Home and relationship well-being. Sexual activity. Eating habits. History of falls. Memory and ability to understand (cognition). Work and work Statistician. Reproductive health. Screening  You may have the following tests or measurements: Height, weight, and BMI. Blood pressure. Lipid and cholesterol levels. These may be checked every 5 years, or more frequently if you are over 70 years old. Skin check. Lung cancer screening. You may have this screening every year starting at age 35 if you have a 30-pack-year history of smoking and currently smoke or have quit within the past 15 years. Fecal occult blood test (FOBT) of the stool. You may have this test every year starting at age 62. Flexible sigmoidoscopy or colonoscopy. You may have a sigmoidoscopy every 5 years or a colonoscopy every 10 years starting at age 35. Hepatitis C blood test. Hepatitis B blood test. Sexually transmitted disease (STD) testing. Diabetes screening. This is done by checking your blood sugar (glucose) after you have not eaten for a while (fasting). You may have this done every 1-3 years. Bone density scan. This  is done to screen for osteoporosis. You may have this done starting at age 87. Mammogram. This may be done every 1-2 years. Talk to your health care provider about how often you should have regular mammograms. Talk with your health care provider about your test results, treatment options, and if necessary, the need for more tests. Vaccines  Your health care provider may recommend certain vaccines, such as: Influenza vaccine. This is  recommended every year. Tetanus, diphtheria, and acellular pertussis (Tdap, Td) vaccine. You may need a Td booster every 10 years. Zoster vaccine. You may need this after age 71. Pneumococcal 13-valent conjugate (PCV13) vaccine. One dose is recommended after age 63. Pneumococcal polysaccharide (PPSV23) vaccine. One dose is recommended after age 26. Talk to your health care provider about which screenings and vaccines you need and how often you need them. This information is not intended to replace advice given to you by your health care provider. Make sure you discuss any questions you have with your health care provider. Document Released: 01/04/2016 Document Revised: 08/27/2016 Document Reviewed: 10/09/2015 Elsevier Interactive Patient Education  2017 [place] Prevention in the Home Falls can cause injuries. They can happen to people of all ages. There are many things you can do to make your home safe and to help prevent falls. What can I do on the outside of my home? Regularly fix the edges of walkways and driveways and fix any cracks. Remove anything that might make you trip as you walk through a door, such as a raised step or threshold. Trim any bushes or trees on the path to your home. Use bright outdoor lighting. Clear any walking paths of anything that might make someone trip, such as rocks or tools. Regularly check to see if handrails are loose or broken. Make sure that both sides of any steps have handrails. Any raised decks and porches should have guardrails on the edges. Have any leaves, snow, or ice cleared regularly. Use sand or salt on walking paths during winter. Clean up any spills in your garage right away. This includes oil or grease spills. What can I do in the bathroom? Use night lights. Install grab bars by the toilet and in the tub and shower. Do not use towel bars as grab bars. Use non-skid mats or decals in the tub or shower. If you need to sit down in  the shower, use a plastic, non-slip stool. Keep the floor dry. Clean up any water that spills on the floor as soon as it happens. Remove soap buildup in the tub or shower regularly. Attach bath mats securely with double-sided non-slip rug tape. Do not have throw rugs and other things on the floor that can make you trip. What can I do in the bedroom? Use night lights. Make sure that you have a light by your bed that is easy to reach. Do not use any sheets or blankets that are too big for your bed. They should not hang down onto the floor. Have a firm chair that has side arms. You can use this for support while you get dressed. Do not have throw rugs and other things on the floor that can make you trip. What can I do in the kitchen? Clean up any spills right away. Avoid walking on wet floors. Keep items that you use a lot in easy-to-reach places. If you need to reach something above you, use a strong step stool that has a grab bar. Keep electrical cords out  of the way. Do not use floor polish or wax that makes floors slippery. If you must use wax, use non-skid floor wax. Do not have throw rugs and other things on the floor that can make you trip. What can I do with my stairs? Do not leave any items on the stairs. Make sure that there are handrails on both sides of the stairs and use them. Fix handrails that are broken or loose. Make sure that handrails are as long as the stairways. Check any carpeting to make sure that it is firmly attached to the stairs. Fix any carpet that is loose or worn. Avoid having throw rugs at the top or bottom of the stairs. If you do have throw rugs, attach them to the floor with carpet tape. Make sure that you have a light switch at the top of the stairs and the bottom of the stairs. If you do not have them, ask someone to add them for you. What else can I do to help prevent falls? Wear shoes that: Do not have high heels. Have rubber bottoms. Are comfortable  and fit you well. Are closed at the toe. Do not wear sandals. If you use a stepladder: Make sure that it is fully opened. Do not climb a closed stepladder. Make sure that both sides of the stepladder are locked into place. Ask someone to hold it for you, if possible. Clearly mark and make sure that you can see: Any grab bars or handrails. First and last steps. Where the edge of each step is. Use tools that help you move around (mobility aids) if they are needed. These include: Canes. Walkers. Scooters. Crutches. Turn on the lights when you go into a dark area. Replace any light bulbs as soon as they burn out. Set up your furniture so you have a clear path. Avoid moving your furniture around. If any of your floors are uneven, fix them. If there are any pets around you, be aware of where they are. Review your medicines with your doctor. Some medicines can make you feel dizzy. This can increase your chance of falling. Ask your doctor what other things that you can do to help prevent falls. This information is not intended to replace advice given to you by your health care provider. Make sure you discuss any questions you have with your health care provider. Document Released: 10/04/2009 Document Revised: 05/15/2016 Document Reviewed: 01/12/2015 Elsevier Interactive Patient Education  2017 [employer].

## 2023-03-24 ENCOUNTER — Telehealth: Payer: Self-pay | Admitting: Acute Care

## 2023-03-24 NOTE — Telephone Encounter (Signed)
Noted ,will discuss at future visit.

## 2023-03-24 NOTE — Telephone Encounter (Signed)
Will forward this message to PCP as an FYI that pt is not interested in participating in lung cancer screening .

## 2023-04-20 ENCOUNTER — Ambulatory Visit: Payer: 59 | Admitting: Nurse Practitioner

## 2024-01-06 ENCOUNTER — Ambulatory Visit
Admission: EM | Admit: 2024-01-06 | Discharge: 2024-01-06 | Disposition: A | Discharge status: Home / Self Care | Payer: 59 | Attending: Family Medicine | Admitting: Family Medicine

## 2024-01-06 ENCOUNTER — Ambulatory Visit (INDEPENDENT_AMBULATORY_CARE_PROVIDER_SITE_OTHER): Payer: 59

## 2024-01-06 DIAGNOSIS — I10 Essential (primary) hypertension: Secondary | ICD-10-CM

## 2024-01-06 DIAGNOSIS — J209 Acute bronchitis, unspecified: Secondary | ICD-10-CM

## 2024-01-06 DIAGNOSIS — F17200 Nicotine dependence, unspecified, uncomplicated: Secondary | ICD-10-CM

## 2024-01-06 MED ORDER — AZITHROMYCIN 250 MG PO TABS: ORAL_TABLET | ORAL | 0 refills | Status: DC

## 2024-01-06 MED ORDER — PREDNISONE 50 MG PO TABS: 50.0000 mg | ORAL_TABLET | Freq: Every day | ORAL | 0 refills | Status: AC

## 2024-01-06 NOTE — ED Provider Notes (Addendum)
 [facility]    CSN: [id number] Arrival date & time: 01/06/24  1128      History   Chief Complaint Chief Complaint  Patient presents with   Cough   Nasal Congestion    HPI Hailey Patterson is a 68 y.o. female.   HPI  History obtained from the patient. Hailey Patterson presents for productive cough for over a week.  She hasn't been able to smoke.  She went to pick up a cigarette but started coughing so bad she said "aint worth it.' She has coughed so much her ribs hurt. She used up a whole bag of [place] cough drops. She has some urinary incontinence due to the cough. Tried Nyquil and DayQuil with out relief. Has discomfort with deep breathing. Denies shortness of breath and fever. No headache and sore throat. When she wakes up her with white stuff on her tongue. Doesn't use any inhalers. Deneis history of COPD.     Past Medical History:  Diagnosis Date   Anxiety    Breast cancer [facility])     Patient Active Problem List   Diagnosis Date Noted   IFG (impaired fasting glucose) 10/19/2022   Chronic pain of right thumb 10/17/2022   Elevated low density lipoprotein (LDL) cholesterol level 10/16/2022   Primary hypertension 12/02/2021   Chronic pain of right ankle 10/31/2021   BMI 29.0-29.9,adult 08/17/2016   General patient noncompliance 02/22/2015   Bilateral cataracts 09/07/2014   History of breast cancer 09/07/2014   Nicotine dependence, cigarettes, uncomplicated 10/14/2013   Acquired deformities of toe 07/15/2013    Past Surgical History:  Procedure Laterality Date   ABDOMINAL HYSTERECTOMY     BREAST BIOPSY Right 2002   positive biopsy   BREAST BIOPSY Right 2006   MRI biopsies X 2 areas both 12:00 (subareolar area and mid position), benign. Only visible with one clip   BREAST LUMPECTOMY Right 2002   positive, radiation and chemo   CESAREAN SECTION     COLONOSCOPY N/A 05/01/2015   Procedure: COLONOSCOPY;  Surgeon: Luella Sager, MD;  Location: [facility] ENDOSCOPY;   Service: Endoscopy;  Laterality: N/A;   EXOSTECTECTOMY TOE Right 09/29/2016   Procedure: EXOSTECTECTOMY TOE 3RD RIGHT TOE;  Surgeon: Lizzie Riis, DPM;  Location: [facility];  Service: Podiatry;  Laterality: Right;   FOOT SURGERY     bilateral feet   KIDNEY SURGERY     MASS EXCISION Right 09/29/2016   Procedure: EXCISION BENIGN LESION 1.0 CM SUB 3 RIGHT;  Surgeon: Lizzie Riis, DPM;  Location: [facility];  Service: Podiatry;  Laterality: Right;   OSTECTOMY Right 09/29/2016   Procedure: BASE WEDGE OSTECTOMY FIRST RIGHT TOE;  Surgeon: Lizzie Riis, DPM;  Location: [facility];  Service: Podiatry;  Laterality: Right;   TONSILLECTOMY      OB History   No obstetric history on file.      Home Medications    Prior to Admission medications   Medication Sig Start Date End Date Taking? Authorizing Provider  azithromycin  (ZITHROMAX  Z-PAK) 250 MG tablet Take 2 tablets on day 1 then 1 tablet daily 01/06/24  Yes Romonia Yanik, DO  predniSONE  (DELTASONE ) 50 MG tablet Take 1 tablet (50 mg total) by mouth daily for 5 days. 01/06/24 01/11/24 Yes Fidel Huddle, DO    Family History Family History  Problem Relation Age of Onset   Breast cancer Mother    Bladder Cancer Neg Hx    Kidney cancer Neg Hx  Social History Social History   Tobacco Use   Smoking status: Heavy Smoker    Current packs/day: 1.00    Average packs/day: 1 pack/day for 52.0 years (52.0 ttl pk-yrs)    Types: Cigarettes   Smokeless tobacco: Never  Substance Use Topics   Alcohol use: No   Drug use: No     Allergies   Macrobid [nitrofurantoin], Losartan, Sulfa antibiotics, Codeine, Meperidine, and Morphine   Review of Systems Review of Systems: negative unless otherwise stated in HPI.      Physical Exam Triage Vital Signs ED Triage Vitals  Encounter Vitals Group     BP 01/06/24 1231 (!) 171/99     Systolic BP Percentile --      Diastolic BP Percentile --       Pulse Rate 01/06/24 1231 71     Resp 01/06/24 1231 19     Temp 01/06/24 1231 98.4 F (36.9 C)     Temp Source 01/06/24 1231 Oral     SpO2 01/06/24 1231 100 %     Weight --      Height --      Head Circumference --      Peak Flow --      Pain Score 01/06/24 1229 0     Pain Loc --      Pain Education --      Exclude from Growth Chart --    No data found.  Updated Vital Signs BP (!) 155/103 (BP Location: Left Arm)   Pulse 71   Temp 98.4 F (36.9 C) (Oral)   Resp 19   SpO2 100%   Visual Acuity Right Eye Distance:   Left Eye Distance:   Bilateral Distance:    Right Eye Near:   Left Eye Near:    Bilateral Near:     Physical Exam GEN:     alert, ill but non-toxic appearing female in no distress    HENT:  mucus membranes moist, oropharyngeal without lesions or erythema, no tonsillar hypertrophy or exudates, clear nasal discharge EYES:   pupils equal and reactive, no scleral injection or discharge RESP:  no increased work of breathing, expiratory wheezing, coarse breath sounds bilaterally CVS:   regular rate and rhythm Skin:   warm and dry    UC Treatments / Results  Labs (all labs ordered are listed, but only abnormal results are displayed) Labs Reviewed - No data to display  EKG   Radiology DG Chest 2 View Result Date: 01/06/2024 CLINICAL DATA:  Productive cough for 1 week EXAM: CHEST - 2 VIEW COMPARISON:  None Available. FINDINGS: Normal cardiac silhouette. No effusion, infiltrate or pneumothorax. Posterior RIGHT healed rib fractures. IMPRESSION: No acute cardiopulmonary process. Electronically Signed   By: Deboraha Fallow M.D.   On: 01/06/2024 15:06    Procedures Procedures (including critical care time)  Medications Ordered in UC Medications - No data to display  Initial Impression / Assessment and Plan / UC Course  I have reviewed the triage vital signs and the nursing notes.  Pertinent labs & imaging results that were available during my care of  the patient were reviewed by me and considered in my medical decision making (see chart for details).       Pt is a 68 y.o. female who is a daily cigarette smoker presents for 1 week of cough that is not improving.  Hailey Patterson is afebrile here without recent antipyretics. Satting well on room air. Overall pt is  non-toxic appearing, well  hydrated, without respiratory distress. Pulmonary exam is remarkable for faint expiratory wheezing, coarse breath sounds bilaterally with productive cough.  After shared decision making, we will  pursue chest x-ray.  COVID  and influenza testing deferred due to length of symptoms.   Chest xray personally reviewed by me without focal pneumonia, pleural effusion, cardiomegaly or pneumothorax.  She does have some fractures on the right side that appear to be old based off her prior chest x-rays going back to at least 2017.  She reports she fell off a horse about 20 years ago.  Treat acute bronchitis with steroids and antibiotics as below. Typical duration of symptoms discussed. Return and ED precautions given and patient voiced understanding.  Patient states she were to return if her cough does not improve.  She is hypertensive today at 171/99.  After sitting BP was rechecked at 155/103.  She has history of hypertension.  Per PCP note on 10/17/2022 she does not take any medication for this.  BP at that visit was 129/83.  Recommended patient take her blood pressure daily and follow-up with her primary care doctor in the next 2 to 3 weeks.   Discussed MDM, treatment plan and plan for follow-up with patient who agrees with plan.    Radiologist impression reviewed.  Final Clinical Impressions(s) / UC Diagnoses   Final diagnoses:  Acute bronchitis, unspecified organism  Elevated blood pressure reading with diagnosis of hypertension  Current every day smoker     Discharge Instructions      Stop by the pharmacy to pick up your prescriptions.  Follow up with your  primary care provider as needed.  Your chest xray did not show evidence of pneumonia though the radiologist has not yet read it. If they find something that I didn't, I will call you.        ED Prescriptions     Medication Sig Dispense Auth. Provider   predniSONE  (DELTASONE ) 50 MG tablet Take 1 tablet (50 mg total) by mouth daily for 5 days. 5 tablet Argelio Granier, DO   azithromycin  (ZITHROMAX  Z-PAK) 250 MG tablet Take 2 tablets on day 1 then 1 tablet daily 6 tablet Caly Pellum, DO      PDMP not reviewed this encounter.        Fidel Huddle, DO 01/06/24 1534

## 2024-01-06 NOTE — Discharge Instructions (Addendum)
 Stop by the pharmacy to pick up your prescriptions.  Follow up with your primary care provider as needed. Your chest xray did not show evidence of pneumonia though the radiologist has not yet read it. If they find something that I didn't, I will call you.

## 2024-01-06 NOTE — ED Triage Notes (Signed)
 Sx x 1 week  Non productive cough Nasal congestion yellow Post nasal drip Tongue is white in the morning Rib pain from coughing

## 2024-02-09 ENCOUNTER — Ambulatory Visit: Payer: 59 | Admitting: Emergency Medicine

## 2024-02-09 VITALS — Ht 62.0 in | Wt 160.0 lb

## 2024-02-09 DIAGNOSIS — F17210 Nicotine dependence, cigarettes, uncomplicated: Secondary | ICD-10-CM | POA: Diagnosis not present

## 2024-02-09 DIAGNOSIS — Z0000 Encounter for general adult medical examination without abnormal findings: Secondary | ICD-10-CM | POA: Diagnosis not present

## 2024-02-09 DIAGNOSIS — Z780 Asymptomatic menopausal state: Secondary | ICD-10-CM

## 2024-02-09 DIAGNOSIS — Z1231 Encounter for screening mammogram for malignant neoplasm of breast: Secondary | ICD-10-CM

## 2024-02-09 NOTE — Progress Notes (Signed)
 Subjective:   Hailey Patterson is a 68 y.o. female who presents for Medicare Annual (Subsequent) preventive examination.  This patient declined Interactive audio and Acupuncturist. Therefore the visit was completed with audio only.   Visit Complete: Virtual I connected with  Dewaine Oats on 02/09/24 by a audio enabled telemedicine application and verified that I am speaking with the correct person using two identifiers.  Patient Location: Home  Provider Location: Office/Clinic  I discussed the limitations of evaluation and management by telemedicine. The patient expressed understanding and agreed to proceed.  Vital Signs: Because this visit was a virtual/telehealth visit, some criteria may be missing or patient reported. Any vitals not documented were not able to be obtained and vitals that have been documented are patient reported.   Cardiac Risk Factors include: advanced age (>51men, >93 women);hypertension;smoking/ tobacco exposure     Objective:    Today's Vitals   02/09/24 1302 02/09/24 1303  Weight: 160 lb (72.6 kg)   Height: 5\' 2"  (1.575 m)   PainSc:  6    Body mass index is 29.26 kg/m.     02/09/2024    1:15 PM 01/06/2024   12:29 PM 02/03/2023    1:13 PM 01/01/2022    8:22 AM 11/12/2020    2:39 PM 09/29/2016   12:12 PM 09/24/2016   10:15 AM  Advanced Directives  Does Patient Have a Medical Advance Directive? No No No No No No No  Would patient like information on creating a medical advance directive? No - Patient declined No - Patient declined No - Patient declined No - Patient declined  Yes - Educational materials given     Current Medications (verified) Outpatient Encounter Medications as of 02/09/2024  Medication Sig   azithromycin (ZITHROMAX Z-PAK) 250 MG tablet Take 2 tablets on day 1 then 1 tablet daily (Patient not taking: Reported on 02/09/2024)   No facility-administered encounter medications on file as of 02/09/2024.    Allergies  (verified) Macrobid [nitrofurantoin], Losartan, Sulfa antibiotics, Codeine, Meperidine, and Morphine   History: Past Medical History:  Diagnosis Date   Anxiety    Breast cancer [facility])    Past Surgical History:  Procedure Laterality Date   ABDOMINAL HYSTERECTOMY     BREAST BIOPSY Right 2002   positive biopsy   BREAST BIOPSY Right 2006   MRI biopsies X 2 areas both 12:00 (subareolar area and mid position), benign. Only visible with one clip   BREAST LUMPECTOMY Right 2002   positive, radiation and chemo   CESAREAN SECTION     COLONOSCOPY N/A 05/01/2015   Procedure: COLONOSCOPY;  Surgeon: Elnita Maxwell, MD;  Location: [facility] END[facility]COPY;  Service: Endoscopy;  Laterality: N/A;   EXOSTECTECTOMY TOE Right 09/29/2016   Procedure: EXOSTECTECTOMY TOE 3RD RIGHT TOE;  Surgeon: Asencion Islam, DPM;  Location: [facility];  Service: Podiatry;  Laterality: Right;   FOOT SURGERY     bilateral feet   KIDNEY SURGERY     MASS EXCISION Right 09/29/2016   Procedure: EXCISION BENIGN LESION 1.0 CM SUB 3 RIGHT;  Surgeon: Asencion Islam, DPM;  Location: [facility];  Service: Podiatry;  Laterality: Right;   OSTECTOMY Right 09/29/2016   Procedure: BASE WEDGE OSTECTOMY FIRST RIGHT TOE;  Surgeon: Asencion Islam, DPM;  Location: [facility];  Service: Podiatry;  Laterality: Right;   TONSILLECTOMY     Family History  Problem Relation Age of Onset   Breast cancer Mother    Lymphoma Mother  Colon cancer Mother    Lung cancer Mother    Other Father        unknown medical history   Bladder Cancer Neg Hx    Kidney cancer Neg Hx    Social History   Socioeconomic History   Marital status: Married    Spouse name: Jimmy   Number of children: 3   Years of education: Not on file   Highest education level: Not on file  Occupational History   Not on file  Tobacco Use   Smoking status: Every Day    Current packs/day: 1.00    Average packs/day: 1 pack/day for  51.1 years (51.1 ttl pk-yrs)    Types: Cigarettes    Start date: 27   Smokeless tobacco: Never  Vaping Use   Vaping status: Never Used  Substance and Sexual Activity   Alcohol use: No   Drug use: No   Sexual activity: Not Currently  Other Topics Concern   Not on file  Social History Narrative   Not on file   Social Drivers of Health   Financial Resource Strain: Low Risk  (02/09/2024)   Overall Financial Resource Strain (CARDIA)    Difficulty of Paying Living Expenses: Not hard at all  Food Insecurity: No Food Insecurity (02/09/2024)   Hunger Vital Sign    Worried About Running Out of Food in the Last Year: Never true    Ran Out of Food in the Last Year: Never true  Transportation Needs: No Transportation Needs (02/09/2024)   PRAPARE - Administrator, Civil Service (Medical): No    Lack of Transportation (Non-Medical): No  Physical Activity: Inactive (02/09/2024)   Exercise Vital Sign    Days of Exercise per Week: 0 days    Minutes of Exercise per Session: 0 min  Stress: No Stress Concern Present (02/09/2024)   [employer] of Occupational Health - Occupational Stress Questionnaire    Feeling of Stress : Not at all  Social Connections: Moderately Isolated (02/09/2024)   Social Connection and Isolation Panel [NHANES]    Frequency of Communication with Friends and Family: More than three times a week    Frequency of Social Gatherings with Friends and Family: More than three times a week    Attends Religious Services: Never    Database administrator or Organizations: No    Attends Engineer, structural: Never    Marital Status: Married    Tobacco Counseling Ready to quit: Not Answered Counseling given: Not Answered   Clinical Intake:  Pre-visit preparation completed: Yes  Pain : 0-10 Pain Score: 6  Pain Type: Chronic pain Pain Location: Back Pain Descriptors / Indicators: Aching     BMI - recorded: 29.26 Nutritional Status: BMI 25 -29  Overweight Nutritional Risks: None Diabetes: No  How often do you need to have someone help you when you read instructions, pamphlets, or other written materials from your doctor or pharmacy?: 1 - Never  Interpreter Needed?: No  Information entered by :: Tora Kindred, CMA   Activities of Daily Living    02/09/2024    1:05 PM  In your present state of health, do you have any difficulty performing the following activities:  Hearing? 0  Vision? 0  Difficulty concentrating or making decisions? 0  Walking or climbing stairs? 0  Dressing or bathing? 0  Doing errands, shopping? 0  Preparing Food and eating ? N  Using the Toilet? N  In the past six months,  have you accidently leaked urine? N  Do you have problems with loss of bowel control? N  Managing your Medications? N  Managing your Finances? N  Housekeeping or managing your Housekeeping? N    Patient Care Team: Marjie Skiff, NP as PCP - General (Nurse Practitioner)  Indicate any recent Medical Services you may have received from other than [facility] providers in the past year (date may be approximate).     Assessment:   This is a routine wellness examination for [place].  Hearing/Vision screen Hearing Screening - Comments:: Denies hearing loss Vision Screening - Comments:: Needs eye exam, [facility]   Goals Addressed             This Visit's Progress    Patient Stated       Take life as it comes      Depression Screen    02/09/2024    1:13 PM 02/03/2023    1:10 PM 02/03/2023    1:09 PM 01/01/2022    8:34 AM 12/02/2021    3:13 PM 10/31/2021    1:39 PM  PHQ 2/9 Scores  PHQ - 2 Score 0 0 0 0 0 0  PHQ- 9 Score  0 0 0 0 0    Fall Risk    02/09/2024    1:17 PM 02/03/2023    1:14 PM 01/01/2022    8:19 AM 12/02/2021    3:13 PM 10/31/2021    1:38 PM  Fall Risk   Falls in the past year? 0 0 0 0 0  Number falls in past yr: 0 0 0 0 0  Injury with Fall? 0 0 0 0 0  Risk for fall due to : No Fall Risks No Fall  Risks  No Fall Risks   Follow up Falls prevention discussed;Falls evaluation completed Falls prevention discussed;Falls evaluation completed Falls evaluation completed;Falls prevention discussed Falls evaluation completed Falls evaluation completed    MEDICARE RISK AT HOME: Medicare Risk at Home Any stairs in or around the home?: Yes If so, are there any without handrails?: No Home free of loose throw rugs in walkways, pet beds, electrical cords, etc?: No Adequate lighting in your home to reduce risk of falls?: Yes Life alert?: No Use of a cane, walker or w/c?: No Grab bars in the bathroom?: No Shower chair or bench in shower?: No Elevated toilet seat or a handicapped toilet?: Yes  TIMED UP AND GO:  Was the test performed?  No    Cognitive Function:        02/09/2024    1:18 PM  6CIT Screen  What Year? 0 points  What month? 0 points  What time? 0 points  Count back from 20 0 points  Months in reverse 0 points  Repeat phrase 0 points  Total Score 0 points    Immunizations Immunization History  Administered Date(s) Administered   Influenza,inj,Quad PF,6+ Mos 09/07/2014, 01/10/2016   Influenza-Unspecified 09/07/2014, 01/10/2016   Pneumococcal Polysaccharide-23 05/23/2015   Td 09/19/2002   Td (Adult), 2 Lf Tetanus Toxid, Preservative Free 09/19/2002   Tdap 09/07/2014    TDAP status: Up to date  Flu Vaccine status: Declined, Education has been provided regarding the importance of this vaccine but patient still declined. Advised may receive this vaccine at local pharmacy or Health Dept. Aware to provide a copy of the vaccination record if obtained from local pharmacy or Health Dept. Verbalized acceptance and understanding.  Pneumococcal vaccine status: Declined,  Education has been provided regarding  the importance of this vaccine but patient still declined. Advised may receive this vaccine at local pharmacy or Health Dept. Aware to provide a copy of the vaccination record  if obtained from local pharmacy or Health Dept. Verbalized acceptance and understanding.   Covid-19 vaccine status: Declined, Education has been provided regarding the importance of this vaccine but patient still declined. Advised may receive this vaccine at local pharmacy or Health Dept.or vaccine clinic. Aware to provide a copy of the vaccination record if obtained from local pharmacy or Health Dept. Verbalized acceptance and understanding.  Qualifies for Shingles Vaccine? Yes   Zostavax completed No   Shingrix Completed?: No.    Education has been provided regarding the importance of this vaccine. Patient has been advised to call insurance company to determine out of pocket expense if they have not yet received this vaccine. Advised may also receive vaccine at local pharmacy or Health Dept. Verbalized acceptance and understanding. Patient declined  Screening Tests Health Maintenance  Topic Date Due   Lung Cancer Screening  Never done   Zoster Vaccines- Shingrix (1 of 2) Never done   Pneumonia Vaccine 38+ Years old (2 of 2 - PCV) 05/22/2016   DEXA SCAN  Never done   Colonoscopy  04/30/2020   INFLUENZA VACCINE  07/23/2023   COVID-19 Vaccine (1 - 2024-25 season) Never done   MAMMOGRAM  12/25/2023   DTaP/Tdap/Td (4 - Td or Tdap) 09/07/2024   Medicare Annual Wellness (AWV)  02/08/2025   Hepatitis C Screening  Completed   HPV VACCINES  Aged Out    Health Maintenance  Health Maintenance Due  Topic Date Due   Lung Cancer Screening  Never done   Zoster Vaccines- Shingrix (1 of 2) Never done   Pneumonia Vaccine 45+ Years old (2 of 2 - PCV) 05/22/2016   DEXA SCAN  Never done   Colonoscopy  04/30/2020   INFLUENZA VACCINE  07/23/2023   COVID-19 Vaccine (1 - 2024-25 season) Never done   MAMMOGRAM  12/25/2023    Colorectal cancer screening: Type of screening: Colonoscopy. Completed 05/01/15. Repeat every 5 years Patient refused any further colonoscopies  Mammogram status: Ordered 02/09/24.  Pt provided with contact info and advised to call to schedule appt.   Bone Density status: Ordered 02/09/24. Pt provided with contact info and advised to call to schedule appt.  Lung Cancer Screening: (Low Dose CT Chest recommended if Age 24-80 years, 20 pack-year currently smoking OR have quit w/in 15years.) does qualify.   Lung Cancer Screening Referral: 02/09/24  Additional Screening:  Hepatitis C Screening: does not qualify; Completed 10/17/22  Vision Screening: Recommended annual ophthalmology exams for early detection of glaucoma and other disorders of the eye.  Dental Screening: Recommended annual dental exams for proper oral hygiene    Community Resource Referral / Chronic Care Management: CRR required this visit?  No   CCM required this visit?  No     Plan:     I have personally reviewed and noted the following in the patient's chart:   Medical and social history Use of alcohol, tobacco or illicit drugs  Current medications and supplements including opioid prescriptions. Patient is not currently taking opioid prescriptions. Functional ability and status Nutritional status Physical activity Advanced directives List of other physicians Hospitalizations, surgeries, and ER visits in previous 12 months Vitals Screenings to include cognitive, depression, and falls Referrals and appointments  In addition, I have reviewed and discussed with patient certain preventive protocols, quality metrics, and best practice  recommendations. A written personalized care plan for preventive services as well as general preventive health recommendations were provided to patient.     Lavon Horn, CMA   02/09/2024   After Visit Summary: (MyChart) Due to this being a telephonic visit, the after visit summary with patients personalized plan was offered to patient via MyChart   Nurse Notes:  Needs routine eye exam Declined all vaccines Declined colonoscopy Placed order for MMG and  DEXA scan Placed referral to pulmonology to evaluate for LDCT scan Schedule appt for 02/17/24 (last seen 10/17/22)

## 2024-02-09 NOTE — Patient Instructions (Addendum)
 Hailey Patterson , Thank you for taking time to come for your Medicare Wellness Visit. I appreciate your ongoing commitment to your health goals. Please review the following plan we discussed and let me know if I can assist you in the future.   Referrals/Orders/Follow-Ups/Clinician Recommendations: I have placed an order for a mammogram and bone density test. These can be done in the same appointment. Call [facility] @ [phone] to schedule at your earliest convenience. I have placed a referral to [facility] to evaluate for the need of a low dose lung CT scan to screen for lung cancer. Someone from there office will call you. I have scheduled you an appointment with Aura Dials, NP for 02/17/24 @ 11:20am. Please arrive 15 min early to register. Call and schedule an eye exam at your earliest convenience.  This is a list of the screening recommended for you and due dates:  Health Maintenance  Topic Date Due   Screening for Lung Cancer  Never done   Zoster (Shingles) Vaccine (1 of 2) Never done   Pneumonia Vaccine (2 of 2 - PCV) 05/22/2016   DEXA scan (bone density measurement)  Never done   Colon Cancer Screening  04/30/2020   COVID-19 Vaccine (1 - 2024-25 season) Never done   Mammogram  12/25/2023   Flu Shot  03/21/2024*   DTaP/Tdap/Td vaccine (4 - Td or Tdap) 09/07/2024   Medicare Annual Wellness Visit  02/08/2025   Hepatitis C Screening  Completed   HPV Vaccine  Aged Out  *Topic was postponed. The date shown is not the original due date.    Advanced directives: (Declined) Advance directive discussed with you today. Even though you declined this today, please call our office should you change your mind, and we can give you the proper paperwork for you to fill out.  Next Medicare Annual Wellness Visit scheduled for next year: Yes, 02/14/25 @ 1:10pm (phone visit)

## 2024-02-14 DIAGNOSIS — E559 Vitamin D deficiency, unspecified: Secondary | ICD-10-CM | POA: Insufficient documentation

## 2024-02-14 NOTE — Patient Instructions (Signed)
 Please call to schedule your mammogram and bone density: [facility] at [facility]  Address: [address], [place], [place] [address] Phone: [phone]  [facility] [address] [place],  [place]  [address] Phone: [phone]   Heart-Healthy Eating Plan Many factors influence your heart health, including eating and exercise habits. Heart health is also called coronary health. Coronary risk increases with abnormal blood fat (lipid) levels. A heart-healthy eating plan includes limiting unhealthy fats, increasing healthy fats, limiting salt (sodium) intake, and making other diet and lifestyle changes. What is my plan? Your health care provider may recommend that: You limit your fat intake to _________% or less of your total calories each day. You limit your saturated fat intake to _________% or less of your total calories each day. You limit the amount of cholesterol in your diet to less than _________ mg per day. You limit the amount of sodium in your diet to less than _________ mg per day. What are tips for following this plan? Cooking Cook foods using methods other than frying. Baking, boiling, grilling, and broiling are all good options. Other ways to reduce fat include: Removing the skin from poultry. Removing all visible fats from meats. Steaming vegetables in water or broth. Meal planning  At meals, imagine dividing your plate into fourths: Fill one-half of your plate with vegetables and green salads. Fill one-fourth of your plate with whole grains. Fill one-fourth of your plate with lean protein foods. Eat 2-4 cups of vegetables per day. One cup of vegetables equals 1 cup (91 g) broccoli or cauliflower florets, 2 medium carrots, 1 large bell pepper, 1 large sweet potato, 1 large tomato, 1 medium white potato, 2 cups (150 g) raw leafy greens. Eat 1-2 cups of fruit per day. One cup of fruit equals 1 small  apple, 1 large banana, 1 cup (237 g) mixed fruit, 1 large orange,  cup (82 g) dried fruit, 1 cup (240 mL) 100% fruit juice. Eat more foods that contain soluble fiber. Examples include apples, broccoli, carrots, beans, peas, and barley. Aim to get 25-30 g of fiber per day. Increase your consumption of legumes, nuts, and seeds to 4-5 servings per week. One serving of dried beans or legumes equals  cup (90 g) cooked, 1 serving of nuts is  oz (12 almonds, 24 pistachios, or 7 walnut halves), and 1 serving of seeds equals  oz (8 g). Fats Choose healthy fats more often. Choose monounsaturated and polyunsaturated fats, such as olive and canola oils, avocado oil, flaxseeds, walnuts, almonds, and seeds. Eat more omega-3 fats. Choose salmon, mackerel, sardines, tuna, flaxseed oil, and ground flaxseeds. Aim to eat fish at least 2 times each week. Check food labels carefully to identify foods with trans fats or high amounts of saturated fat. Limit saturated fats. These are found in animal products, such as meats, butter, and cream. Plant sources of saturated fats include palm oil, palm kernel oil, and coconut oil. Avoid foods with partially hydrogenated oils in them. These contain trans fats. Examples are stick margarine, some tub margarines, cookies, crackers, and other baked goods. Avoid fried foods. General information Eat more home-cooked food and less restaurant, buffet, and fast food. Limit or avoid alcohol. Limit foods that are high in added sugar and simple starches such as foods made using white refined flour (white breads, pastries, sweets). Lose weight if you are overweight. Losing just 5-10% of your body weight can help your  overall health and prevent diseases such as diabetes and heart disease. Monitor your sodium intake, especially if you have high blood pressure. Talk with your health care provider about your sodium intake. Try to incorporate more vegetarian meals weekly. What foods should I  eat? Fruits All fresh, canned (in natural juice), or frozen fruits. Vegetables Fresh or frozen vegetables (raw, steamed, roasted, or grilled). Green salads. Grains Most grains. Choose whole wheat and whole grains most of the time. Rice and pasta, including brown rice and pastas made with whole wheat. Meats and other proteins Lean, well-trimmed beef, veal, pork, and lamb. Chicken and [place] without skin. All fish and shellfish. Wild duck, rabbit, pheasant, and venison. Egg whites or low-cholesterol egg substitutes. Dried beans, peas, lentils, and tofu. Seeds and most nuts. Dairy Low-fat or nonfat cheeses, including ricotta and mozzarella. Skim or 1% milk (liquid, powdered, or evaporated). Buttermilk made with low-fat milk. Nonfat or low-fat yogurt. Fats and oils Non-hydrogenated (trans-free) margarines. Vegetable oils, including soybean, sesame, sunflower, olive, avocado, peanut, safflower, corn, canola, and cottonseed. Salad dressings or mayonnaise made with a vegetable oil. Beverages Water (mineral or sparkling). Coffee and tea. Unsweetened ice tea. Diet beverages. Sweets and desserts Sherbet, gelatin, and fruit ice. Small amounts of dark chocolate. Limit all sweets and desserts. Seasonings and condiments All seasonings and condiments. The items listed above may not be a complete list of foods and beverages you can eat. Contact a dietitian for more options. What foods should I avoid? Fruits Canned fruit in heavy syrup. Fruit in cream or butter sauce. Fried fruit. Limit coconut. Vegetables Vegetables cooked in cheese, cream, or butter sauce. Fried vegetables. Grains Breads made with saturated or trans fats, oils, or whole milk. Croissants. Sweet rolls. Donuts. High-fat crackers, such as cheese crackers and chips. Meats and other proteins Fatty meats, such as hot dogs, ribs, sausage, bacon, rib-eye roast or steak. High-fat deli meats, such as salami and bologna. Caviar. Domestic duck and  goose. Organ meats, such as liver. Dairy Cream, sour cream, cream cheese, and creamed cottage cheese. Whole-milk cheeses. Whole or 2% milk (liquid, evaporated, or condensed). Whole buttermilk. Cream sauce or high-fat cheese sauce. Whole-milk yogurt. Fats and oils Meat fat, or shortening. Cocoa butter, hydrogenated oils, palm oil, coconut oil, palm kernel oil. Solid fats and shortenings, including bacon fat, salt pork, lard, and butter. Nondairy cream substitutes. Salad dressings with cheese or sour cream. Beverages Regular sodas and any drinks with added sugar. Sweets and desserts Frosting. Pudding. Cookies. Cakes. Pies. Milk chocolate or white chocolate. Buttered syrups. Full-fat ice cream or ice cream drinks. The items listed above may not be a complete list of foods and beverages to avoid. Contact a dietitian for more information. Summary Heart-healthy meal planning includes limiting unhealthy fats, increasing healthy fats, limiting salt (sodium) intake and making other diet and lifestyle changes. Lose weight if you are overweight. Losing just 5-10% of your body weight can help your overall health and prevent diseases such as diabetes and heart disease. Focus on eating a balance of foods, including fruits and vegetables, low-fat or nonfat dairy, lean protein, nuts and legumes, whole grains, and heart-healthy oils and fats. This information is not intended to replace advice given to you by your health care provider. Make sure you discuss any questions you have with your health care provider. Document Revised: 01/13/2022 Document Reviewed: 01/13/2022 Elsevier Patient Education  2024 [employer].

## 2024-02-17 ENCOUNTER — Encounter: Payer: Self-pay | Admitting: Nurse Practitioner

## 2024-02-17 ENCOUNTER — Ambulatory Visit (INDEPENDENT_AMBULATORY_CARE_PROVIDER_SITE_OTHER): Payer: 59 | Admitting: Nurse Practitioner

## 2024-02-17 VITALS — BP 134/86 | HR 82 | Temp 98.3°F | Ht 61.5 in

## 2024-02-17 DIAGNOSIS — E7800 Pure hypercholesterolemia, unspecified: Secondary | ICD-10-CM | POA: Diagnosis not present

## 2024-02-17 DIAGNOSIS — E559 Vitamin D deficiency, unspecified: Secondary | ICD-10-CM

## 2024-02-17 DIAGNOSIS — Z780 Asymptomatic menopausal state: Secondary | ICD-10-CM

## 2024-02-17 DIAGNOSIS — I10 Essential (primary) hypertension: Secondary | ICD-10-CM

## 2024-02-17 DIAGNOSIS — Z853 Personal history of malignant neoplasm of breast: Secondary | ICD-10-CM

## 2024-02-17 DIAGNOSIS — Z23 Encounter for immunization: Secondary | ICD-10-CM

## 2024-02-17 DIAGNOSIS — Z91199 Patient's noncompliance with other medical treatment and regimen due to unspecified reason: Secondary | ICD-10-CM

## 2024-02-17 DIAGNOSIS — R7301 Impaired fasting glucose: Secondary | ICD-10-CM | POA: Diagnosis not present

## 2024-02-17 DIAGNOSIS — F17210 Nicotine dependence, cigarettes, uncomplicated: Secondary | ICD-10-CM

## 2024-02-17 DIAGNOSIS — Z1231 Encounter for screening mammogram for malignant neoplasm of breast: Secondary | ICD-10-CM

## 2024-02-17 DIAGNOSIS — Z6829 Body mass index (BMI) 29.0-29.9, adult: Secondary | ICD-10-CM

## 2024-02-17 LAB — BAYER DCA HB A1C WAIVED: HB A1C (BAYER DCA - WAIVED): 5.6 % (ref 4.8–5.6)

## 2024-02-17 LAB — MICROALBUMIN, URINE WAIVED
Creatinine, Urine Waived: 200 mg/dL (ref 10–300)
Microalb, Ur Waived: 80 mg/L — ABNORMAL HIGH (ref 0–19)

## 2024-02-17 NOTE — Assessment & Plan Note (Signed)
 Refuses all vaccinations and preventative care like DEXA.  History of bad experiences with medical providers which have effected her trust level.

## 2024-02-17 NOTE — Progress Notes (Signed)
 BP 134/86 (BP Location: Left Arm, Patient Position: Sitting, Cuff Size: Normal)   Pulse 82   Temp 98.3 F (36.8 C) (Oral)   Ht 5' 1.5" (1.562 m)   SpO2 98%   BMI 29.74 kg/m    Subjective:    Patient ID: Hailey Patterson, female    DOB: 05-17-1956, 68 y.o.   MRN: [id number]  HPI: Hailey Patterson is a 68 y.o. female  Chief Complaint  Patient presents with   Hyperlipidemia   Hypertension   IFG   HYPERTENSION / HYPERLIPIDEMIA Not taking any medications at this time for HTN or HLD, continues to refuse these, even after discussions on this.  Does not get vaccinations, has not had good experiences with providers in past she reports and does not trust vaccines.  She is a smoker, smokes one pack per day.  Has smoked since her teen years, about 50 years.  Not interested in quitting at this time. Aspirin: no Recent stressors: no Recurrent headaches: no Visual changes: no Palpitations: no Dyspnea: no Chest pain: no Lower extremity edema: no Dizzy/lightheaded: no The 10-year ASCVD risk score (Arnett DK, et al., 2019) is: 16.5%   Values used to calculate the score:     Age: 68 years     Sex: Female     Is Non-Hispanic African American: No     Diabetic: No     Tobacco smoker: Yes     Systolic Blood Pressure: 134 mmHg     Is BP treated: No     HDL Cholesterol: 39 mg/dL     Total Cholesterol: 203 mg/dL  BREAST CANCER: History of breast cancer, went through chemo and radiation.  No longer follows with oncology, she reports they dismissed her as she refused to take the oral medication for 5 years. Has not seen them in several years. Last mammogram 12/24/21. History of Vitamin D levels low, not taking supplement.  Does not want to get DEXA scan, continues to refuse this.  BACK PAIN Back pain ongoing for a long while and hand numbness. History of right foot surgery in September 2020, since that time has had issues with foot and was told that only thing to help with would "be to amputate  it".  After surgery was in cast for 3 months.  This, and her breast cancer experience, has affected the way she looks at medical providers. Was very active prior to surgery and rode horses, but after surgery had to sell all of this and could no longer be as active. Duration: months Mechanism of injury: no trauma Location: L>R, bilateral, and low back Onset: gradual Severity: moderate Quality: dull, aching, and throbbing Frequency: intermittent Radiation: L leg below the knee Aggravating factors: lifting, movement, and bending Alleviating factors: Lidocaine patches, Tylenol PM Status: stable Treatments attempted: Lidocaine patches, Tylenol PM  Relief with NSAIDs?: No NSAIDs Taken Nighttime pain:  no Paresthesias / decreased sensation:  no Bowel / bladder incontinence:  no Fevers:  no Dysuria / urinary frequency:  no   Relevant past medical, surgical, family and social history reviewed and updated as indicated. Interim medical history since our last visit reviewed. Allergies and medications reviewed and updated.  Review of Systems  Constitutional:  Negative for activity change, appetite change, diaphoresis, fatigue and fever.  Respiratory:  Negative for cough, chest tightness and shortness of breath.   Cardiovascular:  Negative for chest pain, palpitations and leg swelling.  Gastrointestinal: Negative.   Musculoskeletal:  Positive for arthralgias.  Neurological:  Negative.   Psychiatric/Behavioral: Negative.     Per HPI unless specifically indicated above     Objective:    BP 134/86 (BP Location: Left Arm, Patient Position: Sitting, Cuff Size: Normal)   Pulse 82   Temp 98.3 F (36.8 C) (Oral)   Ht 5' 1.5" (1.562 m)   SpO2 98%   BMI 29.74 kg/m   Wt Readings from Last 3 Encounters:  02/09/24 160 lb (72.6 kg)  02/03/23 158 lb (71.7 kg)  10/17/22 158 lb 11.2 oz (72 kg)    Physical Exam Vitals and nursing note reviewed.  Constitutional:      General: She is awake. She is  not in acute distress.    Appearance: Normal appearance. She is well-developed and well-groomed. She is not ill-appearing or toxic-appearing.  HENT:     Head: Normocephalic.     Right Ear: Hearing and external ear normal.     Left Ear: Hearing and external ear normal.  Eyes:     General: Lids are normal.        Right eye: No discharge.        Left eye: No discharge.     Conjunctiva/sclera: Conjunctivae normal.     Pupils: Pupils are equal, round, and reactive to light.  Neck:     Thyroid: No thyromegaly.     Vascular: No carotid bruit.  Cardiovascular:     Rate and Rhythm: Normal rate and regular rhythm.     Heart sounds: Normal heart sounds. No murmur heard.    No gallop.  Pulmonary:     Effort: Pulmonary effort is normal. No accessory muscle usage or respiratory distress.     Breath sounds: Normal breath sounds.  Abdominal:     General: Bowel sounds are normal. There is no distension.     Palpations: Abdomen is soft.     Tenderness: There is no abdominal tenderness.  Musculoskeletal:     Cervical back: Normal range of motion and neck supple.     Right lower leg: No edema.     Left lower leg: No edema.  Lymphadenopathy:     Cervical: No cervical adenopathy.  Skin:    General: Skin is warm and dry.  Neurological:     Mental Status: She is alert and oriented to person, place, and time.     Deep Tendon Reflexes: Reflexes are normal and symmetric.     Reflex Scores:      Brachioradialis reflexes are 2+ on the right side and 2+ on the left side.      Patellar reflexes are 2+ on the right side and 2+ on the left side. Psychiatric:        Attention and Perception: Attention normal.        Mood and Affect: Mood normal.        Speech: Speech normal.        Behavior: Behavior normal. Behavior is cooperative.        Thought Content: Thought content normal.    Results for orders placed or performed in visit on 10/17/22  CBC with Differential/Platelet   Collection Time: 10/17/22   4:38 PM  Result Value Ref Range   WBC 7.5 3.4 - 10.8 x10E3/uL   RBC 4.25 3.77 - 5.28 x10E6/uL   Hemoglobin 13.5 11.1 - 15.9 g/dL   Hematocrit 16.1 09.6 - 46.6 %   MCV 95 79 - 97 fL   MCH 31.8 26.6 - 33.0 pg   MCHC 33.3 31.5 - 35.7  g/dL   RDW 16.1 09.6 - 04.5 %   Platelets 339 150 - 450 x10E3/uL   Neutrophils 48 Not Estab. %   Lymphs 43 Not Estab. %   Monocytes 6 Not Estab. %   Eos 2 Not Estab. %   Basos 1 Not Estab. %   Neutrophils Absolute 3.6 1.4 - 7.0 x10E3/uL   Lymphocytes Absolute 3.2 (H) 0.7 - 3.1 x10E3/uL   Monocytes Absolute 0.5 0.1 - 0.9 x10E3/uL   EOS (ABSOLUTE) 0.1 0.0 - 0.4 x10E3/uL   Basophils Absolute 0.0 0.0 - 0.2 x10E3/uL   Immature Granulocytes 0 Not Estab. %   Immature Grans (Abs) 0.0 0.0 - 0.1 x10E3/uL  Comprehensive metabolic panel   Collection Time: 10/17/22  4:38 PM  Result Value Ref Range   Glucose 106 (H) 70 - 99 mg/dL   BUN 11 8 - 27 mg/dL   Creatinine, Ser 4.09 0.57 - 1.00 mg/dL   eGFR 69 >81 XB/JYN/8.29   BUN/Creatinine Ratio 12 12 - 28   Sodium 141 134 - 144 mmol/L   Potassium 3.6 3.5 - 5.2 mmol/L   Chloride 101 96 - 106 mmol/L   CO2 24 20 - 29 mmol/L   Calcium 9.3 8.7 - 10.3 mg/dL   Total Protein 6.6 6.0 - 8.5 g/dL   Albumin 4.4 3.9 - 4.9 g/dL   Globulin, Total 2.2 1.5 - 4.5 g/dL   Albumin/Globulin Ratio 2.0 1.2 - 2.2   Bilirubin Total <0.2 0.0 - 1.2 mg/dL   Alkaline Phosphatase 81 44 - 121 IU/L   AST 13 0 - 40 IU/L   ALT 15 0 - 32 IU/L  TSH   Collection Time: 10/17/22  4:38 PM  Result Value Ref Range   TSH 2.310 0.450 - 4.500 uIU/mL  Lipid Panel w/o Chol/HDL Ratio   Collection Time: 10/17/22  4:38 PM  Result Value Ref Range   Cholesterol, Total 203 (H) 100 - 199 mg/dL   Triglycerides 562 (H) 0 - 149 mg/dL   HDL 39 (L) >13 mg/dL   VLDL Cholesterol Cal 47 (H) 5 - 40 mg/dL   LDL Chol Calc (NIH) 086 (H) 0 - 99 mg/dL  Hepatitis C antibody   Collection Time: 10/17/22  4:38 PM  Result Value Ref Range   Hep C Virus Ab Non Reactive Non  Reactive  VITAMIN D 25 Hydroxy (Vit-D Deficiency, Fractures)   Collection Time: 10/17/22  4:38 PM  Result Value Ref Range   Vit D, 25-Hydroxy 26.1 (L) 30.0 - 100.0 ng/mL      Assessment & Plan:   Problem List Items Addressed This Visit       Cardiovascular and Mediastinum   Primary hypertension - Primary   Chronic, stable on recheck today without medication.  Will continue diet focus and recommend regular exercise 30 minutes 5 days a week.  Recommend complete cessation of smoking.  Recommend she monitor BP at least a few mornings a week at home and document.  DASH diet at home.  Labs today: CBC, CMP, TSH, urine ALB.  She refuses to take any medication, even if needed.       Relevant Orders   Microalbumin, Urine Waived   CBC with Differential/Platelet   Comprehensive metabolic panel   TSH     Endocrine   IFG (impaired fasting glucose)   Noted on past labs, check A1c today and recommend heavy focus on diet and regular exercise.      Relevant Orders   Bayer DCA Hb A1c  Waived   Microalbumin, Urine Waived     Other   BMI 29.0-29.9,adult   BMI 29.74. Recommended eating smaller high protein, low fat meals more frequently and exercising 30 mins a day 5 times a week with a goal of 10-15lb weight loss in the next 3 months. Patient voiced their understanding and motivation to adhere to these recommendations.       Elevated low density lipoprotein (LDL) cholesterol level   Noted on past labs with ASCVD 16.5%, she refuses statin therapy.  At length discussion. Reports not liking to take prescription medication and does not like [employer].  Recheck labs today and continue to recommend statin therapy, as higher risk due to smoking and elevations.      Relevant Orders   Comprehensive metabolic panel   Lipid Panel w/o Chol/HDL Ratio   General patient noncompliance   Refuses all vaccinations and preventative care like DEXA.  History of bad experiences with medical providers which have  effected her trust level.      History of breast cancer   Diagnosed in 2002 and completed chemotherapy and radiation therapy + surgery.  Continue annual mammograms, this was recommended but she states she will get them every 2-3 years.      Relevant Orders   MM 3D SCREENING MAMMOGRAM BILATERAL BREAST   Nicotine dependence, cigarettes, uncomplicated   I have recommended complete cessation of tobacco use. I have discussed various options available for assistance with tobacco cessation including over the counter methods (Nicotine gum, patch and lozenges). We also discussed prescription options (Chantix, Nicotine Inhaler / Nasal Spray). The patient is not interested in pursuing any prescription tobacco cessation options at this time.  Recommend CT lung cancer screening, she refuses this.      Vitamin D deficiency   Ongoing, recommend she take Vitamin D3 2000 units daily.  Check labs today.      Relevant Orders   VITAMIN D 25 Hydroxy (Vit-D Deficiency, Fractures)   Other Visit Diagnoses       Encounter for screening mammogram for malignant neoplasm of breast       Mammogram ordered and instructed how to schedule.   Relevant Orders   MM 3D SCREENING MAMMOGRAM BILATERAL BREAST        Follow up plan: Return in about 1 year (around 02/16/2025) for Annual Physical.

## 2024-02-17 NOTE — Assessment & Plan Note (Signed)
 Chronic, stable on recheck today without medication.  Will continue diet focus and recommend regular exercise 30 minutes 5 days a week.  Recommend complete cessation of smoking.  Recommend she monitor BP at least a few mornings a week at home and document.  DASH diet at home.  Labs today: CBC, CMP, TSH, urine ALB.  She refuses to take any medication, even if needed.

## 2024-02-17 NOTE — Assessment & Plan Note (Signed)
 Noted on past labs with ASCVD 16.5%, she refuses statin therapy.  At length discussion. Reports not liking to take prescription medication and does not like [employer].  Recheck labs today and continue to recommend statin therapy, as higher risk due to smoking and elevations.

## 2024-02-17 NOTE — Assessment & Plan Note (Signed)
 Noted on past labs, check A1c today and recommend heavy focus on diet and regular exercise.

## 2024-02-17 NOTE — Assessment & Plan Note (Signed)
 Ongoing, recommend she take Vitamin D3 2000 units daily.  Check labs today.

## 2024-02-17 NOTE — Assessment & Plan Note (Signed)
 BMI 29.74. Recommended eating smaller high protein, low fat meals more frequently and exercising 30 mins a day 5 times a week with a goal of 10-15lb weight loss in the next 3 months. Patient voiced their understanding and motivation to adhere to these recommendations.

## 2024-02-17 NOTE — Assessment & Plan Note (Signed)
 Diagnosed in 2002 and completed chemotherapy and radiation therapy + surgery.  Continue annual mammograms, this was recommended but she states she will get them every 2-3 years.

## 2024-02-17 NOTE — Assessment & Plan Note (Signed)
 I have recommended complete cessation of tobacco use. I have discussed various options available for assistance with tobacco cessation including over the counter methods (Nicotine gum, patch and lozenges). We also discussed prescription options (Chantix, Nicotine Inhaler / Nasal Spray). The patient is not interested in pursuing any prescription tobacco cessation options at this time.  Recommend CT lung cancer screening, she refuses this.

## 2024-02-18 ENCOUNTER — Encounter: Payer: Self-pay | Admitting: Nurse Practitioner

## 2024-02-18 LAB — CBC WITH DIFFERENTIAL/PLATELET
Basophils Absolute: 0 10*3/uL (ref 0.0–0.2)
Basos: 0 %
EOS (ABSOLUTE): 0.1 10*3/uL (ref 0.0–0.4)
Eos: 1 %
Hematocrit: 42.5 % (ref 34.0–46.6)
Hemoglobin: 14.2 g/dL (ref 11.1–15.9)
Immature Grans (Abs): 0 10*3/uL (ref 0.0–0.1)
Immature Granulocytes: 0 %
Lymphocytes Absolute: 2 10*3/uL (ref 0.7–3.1)
Lymphs: 38 %
MCH: 32.1 pg (ref 26.6–33.0)
MCHC: 33.4 g/dL (ref 31.5–35.7)
MCV: 96 fL (ref 79–97)
Monocytes Absolute: 0.3 10*3/uL (ref 0.1–0.9)
Monocytes: 6 %
Neutrophils Absolute: 2.9 10*3/uL (ref 1.4–7.0)
Neutrophils: 55 %
Platelets: 233 10*3/uL (ref 150–450)
RBC: 4.42 x10E6/uL (ref 3.77–5.28)
RDW: 14.1 % (ref 11.7–15.4)
WBC: 5.4 10*3/uL (ref 3.4–10.8)

## 2024-02-18 LAB — COMPREHENSIVE METABOLIC PANEL
ALT: 17 IU/L (ref 0–32)
AST: 20 IU/L (ref 0–40)
Albumin: 4.2 g/dL (ref 3.9–4.9)
Alkaline Phosphatase: 75 IU/L (ref 44–121)
BUN/Creatinine Ratio: 14 (ref 12–28)
BUN: 15 mg/dL (ref 8–27)
Bilirubin Total: 0.3 mg/dL (ref 0.0–1.2)
CO2: 22 mmol/L (ref 20–29)
Calcium: 9.2 mg/dL (ref 8.7–10.3)
Chloride: 104 mmol/L (ref 96–106)
Creatinine, Ser: 1.05 mg/dL — ABNORMAL HIGH (ref 0.57–1.00)
Globulin, Total: 2.2 g/dL (ref 1.5–4.5)
Glucose: 124 mg/dL — ABNORMAL HIGH (ref 70–99)
Potassium: 4.2 mmol/L (ref 3.5–5.2)
Sodium: 140 mmol/L (ref 134–144)
Total Protein: 6.4 g/dL (ref 6.0–8.5)
eGFR: 58 mL/min/{1.73_m2} — ABNORMAL LOW (ref >59)

## 2024-02-18 LAB — VITAMIN D 25 HYDROXY (VIT D DEFICIENCY, FRACTURES): Vit D, 25-Hydroxy: 27.9 ng/mL — ABNORMAL LOW (ref 30.0–100.0)

## 2024-02-18 LAB — LIPID PANEL W/O CHOL/HDL RATIO
Cholesterol, Total: 188 mg/dL (ref 100–199)
HDL: 44 mg/dL (ref >39)
LDL Chol Calc (NIH): 124 mg/dL — ABNORMAL HIGH (ref 0–99)
Triglycerides: 111 mg/dL (ref 0–149)
VLDL Cholesterol Cal: 20 mg/dL (ref 5–40)

## 2024-02-18 LAB — TSH: TSH: 2.66 u[IU]/mL (ref 0.450–4.500)

## 2024-02-18 NOTE — Progress Notes (Signed)
 Contacted via MyChart   Good afternoon Gunnar Fusi, I am not telling you how your labs look;) - Kidney function, eGFR and creatinine shows some mild kidney disease this check.  I highly recommend increase water intake and if BP levels show consistent elevations we may need to start medication to lower them and protect kidneys. Liver function, AST and ALT, is normal. - Glucose, sugar, is a little elevated.  Did you eat just before visit? Let me know.  It may be good to have you return and check a lab for diabetes. - CBC shows no anemia or infection. - Thyroid level is normal. - Vitamin D level remains a little low.  Ensure to take Vitamin D3 2000 units daily for bone health. - Lipid panel continues to show elevation in LDL, you would benefit from statin therapy to help prevent stroke, especially since you smoke too.  Something to think about.  Fish oil is also a good idea if you prefer more natural approach. Any questions?  Would you be okay coming back and checking lab for diabetes?  Let me know.  Thank you:) Keep being amazing!!  Thank you for allowing me to participate in your care.  I appreciate you. Kindest regards, Albino Bufford

## 2024-08-02 ENCOUNTER — Telehealth: Payer: Self-pay

## 2024-08-02 NOTE — Telephone Encounter (Signed)
 Ok for E2C2 to review.  Reached out to patient but was unable to reach. Please advise they are on our list of patients needing to have a mammogram completed and we have the mobile bus here at our office 10/14/2024 and 11/21/2024. If they would like to proceed ask if there is a preferred time to be scheduled and once I can schedule that I will be back in touch with the patient.

## 2024-08-02 NOTE — Telephone Encounter (Signed)
 Patient returned call and was advised about the mobile mammogram. She says that 10/14/24 after 12 pm was okay for her. Advised that you will contact her back once everything is finalized.

## 2024-08-03 ENCOUNTER — Ambulatory Visit: Admitting: Pediatrics

## 2024-09-02 ENCOUNTER — Ambulatory Visit
Admission: RE | Admit: 2024-09-02 | Discharge: 2024-09-02 | Disposition: A | Discharge status: Home / Self Care | Source: Ambulatory Visit | Attending: Nurse Practitioner | Admitting: Nurse Practitioner

## 2024-09-02 DIAGNOSIS — Z853 Personal history of malignant neoplasm of breast: Secondary | ICD-10-CM | POA: Insufficient documentation

## 2024-09-02 DIAGNOSIS — Z1231 Encounter for screening mammogram for malignant neoplasm of breast: Secondary | ICD-10-CM | POA: Diagnosis not present

## 2024-09-06 ENCOUNTER — Ambulatory Visit: Payer: Self-pay | Admitting: Nurse Practitioner

## 2024-09-06 NOTE — Progress Notes (Signed)
 Contacted via MyChart   Normal mammogram, may repeat in one year:)

## 2024-09-09 ENCOUNTER — Ambulatory Visit: Payer: Self-pay

## 2024-09-09 NOTE — Telephone Encounter (Signed)
 FYI Only or Action Required?: FYI only for provider.  Patient was last seen in primary care on 02/17/2024 by Valerio Melanie DASEN, NP.  Called Nurse Triage reporting Rash.  Symptoms began about a month ago.  Interventions attempted: OTC medications: Triamcinolone  cream.  Symptoms are: unchanged.  Triage Disposition: See PCP Within 2 Weeks  Patient/caregiver understands and will follow disposition?: Yes  **Appt. Scheduled for 10/1**      Copied from CRM #[id number]. Topic: Clinical - Red Word Triage >> Sep 09, 2024 10:20 AM Charlet HERO wrote: Red Word that prompted transfer to Nurse Triage: Patient is calling about a rash on her stomach she has had for a month she is stating that she has tried otc and  she is wanting to schedule an appt with Jolene Crissman. The rash is itchy and red dark color. Reason for Disposition  Red, moist, irritated area between skin folds (or under larger breasts)  Answer Assessment - Initial Assessment Questions 1. APPEARANCE of RASH: What does the rash look like? (e.g., blisters, dry flaky skin, red spots, redness, sores)      She reports small bumps, redness on abdomen   2. LOCATION: Where is the rash located?      Abdomen (lower) near incision  scars   3. NUMBER: How many spots are there?      Several   4. SIZE: How big are the spots? (e.g., inches, cm; or compare to size of pinhead, tip of pen, eraser, pea)       Tip of pen  5. ONSET: When did the rash start?      X 1 month   6. ITCHING: Does the rash itch? If Yes, ask: How bad is the itch?  (Scale 0-10; or none, mild, moderate, severe)      Intermittent itch mild  7. PAIN: Does the rash hurt? If Yes, ask: How bad is the pain?  (Scale 0-10; or none, mild, moderate, severe)      No pain, seems inflamed at times   8. OTHER SYMPTOMS: Do you have any other symptoms? (e.g., fever) No     Patient is applying Triamcinolone  cream to the area, some relief noted. She suspects  yeast, as she stated her abdomen is large, and the redness is under pannus. Appt. Scheduled for 10/1; patient will reach out if symptoms worsen. Home care advice given as well.  Protocols used: Rash or Redness - Localized-A-AH

## 2024-09-21 ENCOUNTER — Encounter: Payer: Self-pay | Admitting: Nurse Practitioner

## 2024-09-21 ENCOUNTER — Ambulatory Visit (INDEPENDENT_AMBULATORY_CARE_PROVIDER_SITE_OTHER): Admitting: Nurse Practitioner

## 2024-09-21 VITALS — BP 183/121 | HR 89 | Ht 62.0 in | Wt 174.0 lb

## 2024-09-21 DIAGNOSIS — Z6830 Body mass index (BMI) 30.0-30.9, adult: Secondary | ICD-10-CM | POA: Insufficient documentation

## 2024-09-21 DIAGNOSIS — M793 Panniculitis, unspecified: Secondary | ICD-10-CM

## 2024-09-21 DIAGNOSIS — I10 Essential (primary) hypertension: Secondary | ICD-10-CM

## 2024-09-21 DIAGNOSIS — E669 Obesity, unspecified: Secondary | ICD-10-CM | POA: Insufficient documentation

## 2024-09-21 MED ORDER — KETOCONAZOLE 2 % EX CREA: 1.0000 | TOPICAL_CREAM | Freq: Every day | CUTANEOUS | 1 refills | Status: AC

## 2024-09-21 NOTE — Assessment & Plan Note (Signed)
 Chronic. Not well controlled.  Reviewed blood pressures with patient during visit.  Discussed recommendation for medication.  Patient declined wanting to start medication.  Discussed risks of untreated hypertension.  Follow up in 1 month with PCP.

## 2024-09-21 NOTE — Assessment & Plan Note (Signed)
 Chronic. Patient interested in weight loss injections with GLP1.  Discussed calling her insurance to find out if medication is covered.  Discussed a sleep study to see if patient has OSA which is an indication for weight loss medication.  Patient declined sleep study.  Follow up with PCP in 1 month.

## 2024-09-21 NOTE — Progress Notes (Signed)
 BP (!) 183/121   Pulse 89   Ht 5' 2 (1.575 m)   Wt 174 lb (78.9 kg)   SpO2 98%   BMI 31.83 kg/m    Subjective:    Patient ID: Hailey Patterson, female    DOB: January 17, 1956, 68 y.o.   MRN: [id number]  HPI: Hailey Patterson is a 68 y.o. female  Chief Complaint  Patient presents with   Rash    Has spread to the pelvic area   Weight Management Screening    Would like injections for weight loss    RASH Duration:  months  Location: trunk  Itching: yes Burning: yes Redness: yes Oozing: no Scaling: no Blisters: yes Painful: yes Fevers: no Change in detergents/soaps/personal care products: no Recent illness: no Recent travel:no History of same: no Context: worse Alleviating factors: nothing Treatments attempted:hydrocortisone cream, antibiotic cream, her daughter gave her something that is prescription but doesn't know what it is called Shortness of breath: no  Throat/tongue swelling: no Myalgias/arthralgias: no  HYPERTENSION without Chronic Kidney Disease Hypertension status: uncontrolled  Satisfied with current treatment? yes Duration of hypertension: years BP monitoring frequency:  not checking BP range:  BP medication side effects:  no Medication compliance: excellent compliance Previous BP meds:none Aspirin: no Recurrent headaches: no Visual changes: no Palpitations: no Dyspnea: no Chest pain: no Lower extremity edema: no Dizzy/lightheaded: no   Relevant past medical, surgical, family and social history reviewed and updated as indicated. Interim medical history since our last visit reviewed. Allergies and medications reviewed and updated.  Review of Systems  Eyes:  Negative for visual disturbance.  Respiratory:  Negative for cough, chest tightness and shortness of breath.   Cardiovascular:  Negative for chest pain, palpitations and leg swelling.  Skin:  Positive for rash.  Neurological:  Negative for dizziness and headaches.    Per HPI unless  specifically indicated above     Objective:    BP (!) 183/121   Pulse 89   Ht 5' 2 (1.575 m)   Wt 174 lb (78.9 kg)   SpO2 98%   BMI 31.83 kg/m   Wt Readings from Last 3 Encounters:  09/21/24 174 lb (78.9 kg)  02/09/24 160 lb (72.6 kg)  02/03/23 158 lb (71.7 kg)    Physical Exam Vitals and nursing note reviewed.  Constitutional:      General: She is not in acute distress.    Appearance: Normal appearance. She is normal weight. She is not ill-appearing, toxic-appearing or diaphoretic.  HENT:     Head: Normocephalic.     Right Ear: External ear normal.     Left Ear: External ear normal.     Nose: Nose normal.     Mouth/Throat:     Mouth: Mucous membranes are moist.     Pharynx: Oropharynx is clear.  Eyes:     General:        Right eye: No discharge.        Left eye: No discharge.     Extraocular Movements: Extraocular movements intact.     Conjunctiva/sclera: Conjunctivae normal.     Pupils: Pupils are equal, round, and reactive to light.  Cardiovascular:     Rate and Rhythm: Normal rate and regular rhythm.     Heart sounds: No murmur heard. Pulmonary:     Effort: Pulmonary effort is normal. No respiratory distress.     Breath sounds: Normal breath sounds. No wheezing or rales.  Musculoskeletal:     Cervical back:  Normal range of motion and neck supple.  Skin:    General: Skin is warm and dry.     Capillary Refill: Capillary refill takes less than 2 seconds.      Neurological:     General: No focal deficit present.     Mental Status: She is alert and oriented to person, place, and time. Mental status is at baseline.  Psychiatric:        Mood and Affect: Mood normal.        Behavior: Behavior normal.        Thought Content: Thought content normal.        Judgment: Judgment normal.     Results for orders placed or performed in visit on 02/17/24  Bayer DCA Hb A1c Waived   Collection Time: 02/17/24 11:32 AM  Result Value Ref Range   HB A1C (BAYER DCA -  WAIVED) 5.6 4.8 - 5.6 %  Microalbumin, Urine Waived   Collection Time: 02/17/24 11:32 AM  Result Value Ref Range   Microalb, Ur Waived 80 (H) 0 - 19 mg/L   Creatinine, Urine Waived 200 10 - 300 mg/dL   Microalb/Creat Ratio 30-300 (H) <30 mg/g  CBC with Differential/Platelet   Collection Time: 02/17/24 11:33 AM  Result Value Ref Range   WBC 5.4 3.4 - 10.8 x10E3/uL   RBC 4.42 3.77 - 5.28 x10E6/uL   Hemoglobin 14.2 11.1 - 15.9 g/dL   Hematocrit 57.4 65.9 - 46.6 %   MCV 96 79 - 97 fL   MCH 32.1 26.6 - 33.0 pg   MCHC 33.4 31.5 - 35.7 g/dL   RDW 85.8 88.2 - 84.5 %   Platelets 233 150 - 450 x10E3/uL   Neutrophils 55 Not Estab. %   Lymphs 38 Not Estab. %   Monocytes 6 Not Estab. %   Eos 1 Not Estab. %   Basos 0 Not Estab. %   Neutrophils Absolute 2.9 1.4 - 7.0 x10E3/uL   Lymphocytes Absolute 2.0 0.7 - 3.1 x10E3/uL   Monocytes Absolute 0.3 0.1 - 0.9 x10E3/uL   EOS (ABSOLUTE) 0.1 0.0 - 0.4 x10E3/uL   Basophils Absolute 0.0 0.0 - 0.2 x10E3/uL   Immature Granulocytes 0 Not Estab. %   Immature Grans (Abs) 0.0 0.0 - 0.1 x10E3/uL  Comprehensive metabolic panel   Collection Time: 02/17/24 11:33 AM  Result Value Ref Range   Glucose 124 (H) 70 - 99 mg/dL   BUN 15 8 - 27 mg/dL   Creatinine, Ser 8.94 (H) 0.57 - 1.00 mg/dL   eGFR 58 (L) >40 fO/fpw/8.26   BUN/Creatinine Ratio 14 12 - 28   Sodium 140 134 - 144 mmol/L   Potassium 4.2 3.5 - 5.2 mmol/L   Chloride 104 96 - 106 mmol/L   CO2 22 20 - 29 mmol/L   Calcium 9.2 8.7 - 10.3 mg/dL   Total Protein 6.4 6.0 - 8.5 g/dL   Albumin 4.2 3.9 - 4.9 g/dL   Globulin, Total 2.2 1.5 - 4.5 g/dL   Bilirubin Total 0.3 0.0 - 1.2 mg/dL   Alkaline Phosphatase 75 44 - 121 IU/L   AST 20 0 - 40 IU/L   ALT 17 0 - 32 IU/L  Lipid Panel w/o Chol/HDL Ratio   Collection Time: 02/17/24 11:33 AM  Result Value Ref Range   Cholesterol, Total 188 100 - 199 mg/dL   Triglycerides 888 0 - 149 mg/dL   HDL 44 >60 mg/dL   VLDL Cholesterol Cal 20 5 - 40 mg/dL  LDL  Chol Calc (NIH) 124 (H) 0 - 99 mg/dL  TSH   Collection Time: 02/17/24 11:33 AM  Result Value Ref Range   TSH 2.660 0.450 - 4.500 uIU/mL  VITAMIN D  25 Hydroxy (Vit-D Deficiency, Fractures)   Collection Time: 02/17/24 11:33 AM  Result Value Ref Range   Vit D, 25-Hydroxy 27.9 (L) 30.0 - 100.0 ng/mL      Assessment & Plan:   Problem List Items Addressed This Visit       Cardiovascular and Mediastinum   Primary hypertension - Primary   Chronic. Not well controlled.  Reviewed blood pressures with patient during visit.  Discussed recommendation for medication.  Patient declined wanting to start medication.  Discussed risks of untreated hypertension.  Follow up in 1 month with PCP.        Other   BMI 30.0-30.9,adult   Chronic. Patient interested in weight loss injections with GLP1.  Discussed calling her insurance to find out if medication is covered.  Discussed a sleep study to see if patient has OSA which is an indication for weight loss medication.  Patient declined sleep study.  Follow up with PCP in 1 month.       Other Visit Diagnoses       Panniculitis       Will treat with Ketoconazole cream.  Complete course of medicaiton.  Follow up if not improved.        Follow up plan: Return in about 1 month (around 10/22/2024) for BP Check (With Jolene).   A total of 30 minutes were spent on this encounter today.  When total time is documented, this includes both the face-to-face and non-face-to-face time personally spent before, during and after the visit on the date of the encounter reviewing blood pressures, medications, risk of untreated HTN, and sleep study.

## 2024-10-22 NOTE — Patient Instructions (Signed)

## 2024-10-25 ENCOUNTER — Encounter: Payer: Self-pay | Admitting: Nurse Practitioner

## 2024-10-25 ENCOUNTER — Ambulatory Visit (INDEPENDENT_AMBULATORY_CARE_PROVIDER_SITE_OTHER): Admitting: Nurse Practitioner

## 2024-10-25 VITALS — BP 146/88 | HR 84 | Temp 98.6°F

## 2024-10-25 DIAGNOSIS — I10 Essential (primary) hypertension: Secondary | ICD-10-CM

## 2024-10-25 DIAGNOSIS — F17210 Nicotine dependence, cigarettes, uncomplicated: Secondary | ICD-10-CM

## 2024-10-25 NOTE — Assessment & Plan Note (Signed)
 I have recommended complete cessation of tobacco use. I have discussed various options available for assistance with tobacco cessation including over the counter methods (Nicotine gum, patch and lozenges). We also discussed prescription options (Chantix, Nicotine Inhaler / Nasal Spray). The patient is not interested in pursuing any prescription tobacco cessation options at this time.  Recommend CT lung cancer screening, she refuses this.

## 2024-10-25 NOTE — Assessment & Plan Note (Signed)
 Chronic, ongoing. Remains above goal on recheck. She refuses to take any medication, even if needed. Although wants to look into compounded GLP1 online for weight loss.  Will continue diet focus and recommend regular exercise 30 minutes 5 days a week.  Recommend complete cessation of smoking.  Recommend she monitor BP at least a few mornings a week at home and document.  DASH diet at home.  Labs today: up to date.

## 2024-10-25 NOTE — Progress Notes (Signed)
 BP (!) 146/88 (BP Location: Left Arm, Patient Position: Sitting, Cuff Size: Normal)   Pulse 84   Temp 98.6 F (37 C) (Oral)   SpO2 98%    Subjective:    Patient ID: Hailey Patterson, female    DOB: 06-Jun-1956, 68 y.o.   MRN: [id number]  HPI: [name] is a 68 y.o. female  Chief Complaint  Patient presents with   Hypertension   HYPERTENSION without Chronic Kidney Disease Follow-up today for BP check. She refuses all medications, have had multiple discussions with her on this. Continues to smoke daily, probably 1 PPD. Knows people who have taken BP medications, like her brother. Hypertension status: uncontrolled - refuses medications Satisfied with current treatment? yes Duration of hypertension: chronic BP monitoring frequency:  not checking BP range:  BP medication side effects:  no Medication compliance: good compliance Aspirin: no Recurrent headaches: no Visual changes: no Palpitations: no Dyspnea: no Chest pain: no Lower extremity edema: no Dizzy/lightheaded: no   Relevant past medical, surgical, family and social history reviewed and updated as indicated. Interim medical history since our last visit reviewed. Allergies and medications reviewed and updated.  Review of Systems  Constitutional:  Negative for activity change, appetite change, diaphoresis, fatigue and fever.  Respiratory:  Negative for cough, chest tightness and shortness of breath.   Cardiovascular:  Negative for chest pain, palpitations and leg swelling.  Gastrointestinal: Negative.   Neurological: Negative.   Psychiatric/Behavioral: Negative.     Per HPI unless specifically indicated above     Objective:    BP (!) 146/88 (BP Location: Left Arm, Patient Position: Sitting, Cuff Size: Normal)   Pulse 84   Temp 98.6 F (37 C) (Oral)   SpO2 98%   Wt Readings from Last 3 Encounters:  09/21/24 174 lb (78.9 kg)  02/09/24 160 lb (72.6 kg)  02/03/23 158 lb (71.7 kg)    Physical Exam Vitals  and nursing note reviewed.  Constitutional:      General: She is awake. She is not in acute distress.    Appearance: She is well-developed and well-groomed. She is obese. She is not ill-appearing or toxic-appearing.  HENT:     Head: Normocephalic.     Right Ear: Hearing and external ear normal.     Left Ear: Hearing and external ear normal.  Eyes:     General: Lids are normal.        Right eye: No discharge.        Left eye: No discharge.     Conjunctiva/sclera: Conjunctivae normal.     Pupils: Pupils are equal, round, and reactive to light.  Neck:     Thyroid : No thyromegaly.     Vascular: No carotid bruit.  Cardiovascular:     Rate and Rhythm: Normal rate and regular rhythm.     Heart sounds: Normal heart sounds. No murmur heard.    No gallop.  Pulmonary:     Effort: Pulmonary effort is normal. No accessory muscle usage or respiratory distress.     Breath sounds: Normal breath sounds.  Abdominal:     General: Bowel sounds are normal. There is no distension.     Palpations: Abdomen is soft.     Tenderness: There is no abdominal tenderness.  Musculoskeletal:     Cervical back: Normal range of motion and neck supple.     Right lower leg: No edema.     Left lower leg: No edema.  Lymphadenopathy:     Cervical:  No cervical adenopathy.  Skin:    General: Skin is warm and dry.  Neurological:     Mental Status: She is alert and oriented to person, place, and time.     Deep Tendon Reflexes: Reflexes are normal and symmetric.     Reflex Scores:      Brachioradialis reflexes are 2+ on the right side and 2+ on the left side.      Patellar reflexes are 2+ on the right side and 2+ on the left side. Psychiatric:        Attention and Perception: Attention normal.        Mood and Affect: Mood normal.        Speech: Speech normal.        Behavior: Behavior normal. Behavior is cooperative.        Thought Content: Thought content normal.    Results for orders placed or performed in  visit on 02/17/24  Bayer DCA Hb A1c Waived   Collection Time: 02/17/24 11:32 AM  Result Value Ref Range   HB A1C (BAYER DCA - WAIVED) 5.6 4.8 - 5.6 %  Microalbumin, Urine Waived   Collection Time: 02/17/24 11:32 AM  Result Value Ref Range   Microalb, Ur Waived 80 (H) 0 - 19 mg/L   Creatinine, Urine Waived 200 10 - 300 mg/dL   Microalb/Creat Ratio 30-300 (H) <30 mg/g  CBC with Differential/Platelet   Collection Time: 02/17/24 11:33 AM  Result Value Ref Range   WBC 5.4 3.4 - 10.8 x10E3/uL   RBC 4.42 3.77 - 5.28 x10E6/uL   Hemoglobin 14.2 11.1 - 15.9 g/dL   Hematocrit 57.4 65.9 - 46.6 %   MCV 96 79 - 97 fL   MCH 32.1 26.6 - 33.0 pg   MCHC 33.4 31.5 - 35.7 g/dL   RDW 85.8 88.2 - 84.5 %   Platelets 233 150 - 450 x10E3/uL   Neutrophils 55 Not Estab. %   Lymphs 38 Not Estab. %   Monocytes 6 Not Estab. %   Eos 1 Not Estab. %   Basos 0 Not Estab. %   Neutrophils Absolute 2.9 1.4 - 7.0 x10E3/uL   Lymphocytes Absolute 2.0 0.7 - 3.1 x10E3/uL   Monocytes Absolute 0.3 0.1 - 0.9 x10E3/uL   EOS (ABSOLUTE) 0.1 0.0 - 0.4 x10E3/uL   Basophils Absolute 0.0 0.0 - 0.2 x10E3/uL   Immature Granulocytes 0 Not Estab. %   Immature Grans (Abs) 0.0 0.0 - 0.1 x10E3/uL  Comprehensive metabolic panel   Collection Time: 02/17/24 11:33 AM  Result Value Ref Range   Glucose 124 (H) 70 - 99 mg/dL   BUN 15 8 - 27 mg/dL   Creatinine, Ser 8.94 (H) 0.57 - 1.00 mg/dL   eGFR 58 (L) >40 fO/fpw/8.26   BUN/Creatinine Ratio 14 12 - 28   Sodium 140 134 - 144 mmol/L   Potassium 4.2 3.5 - 5.2 mmol/L   Chloride 104 96 - 106 mmol/L   CO2 22 20 - 29 mmol/L   Calcium 9.2 8.7 - 10.3 mg/dL   Total Protein 6.4 6.0 - 8.5 g/dL   Albumin 4.2 3.9 - 4.9 g/dL   Globulin, Total 2.2 1.5 - 4.5 g/dL   Bilirubin Total 0.3 0.0 - 1.2 mg/dL   Alkaline Phosphatase 75 44 - 121 IU/L   AST 20 0 - 40 IU/L   ALT 17 0 - 32 IU/L  Lipid Panel w/o Chol/HDL Ratio   Collection Time: 02/17/24 11:33 AM  Result Value Ref Range  Cholesterol,  Total 188 100 - 199 mg/dL   Triglycerides 888 0 - 149 mg/dL   HDL 44 >60 mg/dL   VLDL Cholesterol Cal 20 5 - 40 mg/dL   LDL Chol Calc (NIH) 875 (H) 0 - 99 mg/dL  TSH   Collection Time: 02/17/24 11:33 AM  Result Value Ref Range   TSH 2.660 0.450 - 4.500 uIU/mL  VITAMIN D  25 Hydroxy (Vit-D Deficiency, Fractures)   Collection Time: 02/17/24 11:33 AM  Result Value Ref Range   Vit D, 25-Hydroxy 27.9 (L) 30.0 - 100.0 ng/mL      Assessment & Plan:   Problem List Items Addressed This Visit       Cardiovascular and Mediastinum   Primary hypertension - Primary   Chronic, ongoing. Remains above goal on recheck. She refuses to take any medication, even if needed. Although wants to look into compounded GLP1 online for weight loss.  Will continue diet focus and recommend regular exercise 30 minutes 5 days a week.  Recommend complete cessation of smoking.  Recommend she monitor BP at least a few mornings a week at home and document.  DASH diet at home.  Labs today: up to date.         Other   Nicotine dependence, cigarettes, uncomplicated   I have recommended complete cessation of tobacco use. I have discussed various options available for assistance with tobacco cessation including over the counter methods (Nicotine gum, patch and lozenges). We also discussed prescription options (Chantix, Nicotine Inhaler / Nasal Spray). The patient is not interested in pursuing any prescription tobacco cessation options at this time.  Recommend CT lung cancer screening, she refuses this.        Follow up plan: Return in about 4 months (around 02/27/2025) for Annual Physical.

## 2024-11-24 ENCOUNTER — Telehealth: Payer: Self-pay

## 2024-11-24 NOTE — Telephone Encounter (Signed)
 Copied from CRM #[id number]. Topic: Clinical - Medication Question >> Nov 24, 2024 10:32 AM Antony RAMAN wrote: Reason for CRM: patient calling to say her insurance does cover the weight loss medication she discussed with dr valerio, please advise if she needs an appt for a script or if it can be called in without an appt

## 2024-11-28 NOTE — Telephone Encounter (Signed)
Patient will need an appointment.  Please call to schedule.

## 2024-11-28 NOTE — Telephone Encounter (Signed)
 Called patient and left a message to call back to get scheduled.

## 2024-11-28 NOTE — Telephone Encounter (Unsigned)
 Copied from CRM #[id number]. Topic: Clinical - Medication Question >> Nov 28, 2024 11:09 AM Zy'onna H wrote: Reason for CRM: Patient called in to follow inquire about her PCP, beginning her on a weigh loss medication.  I spoke with Shanika from the [facility] and she informed me that Cannady,NP did receive her inquiry, but she is currently [place] on vacation, I wasn't able to grab a returning date for the patient.   If another provider from the PCP's team can advise the patient what is needed for her to begin the weigh loss mediation and/or if an appointment is required?   Referencing Previous CRM #[id number] >> Nov 28, 2024 11:19 AM Zy'onna H wrote: **Patient is requesting GLP-1**

## 2024-12-31 NOTE — Patient Instructions (Signed)

## 2025-01-02 ENCOUNTER — Ambulatory Visit (INDEPENDENT_AMBULATORY_CARE_PROVIDER_SITE_OTHER): Admitting: Nurse Practitioner

## 2025-01-02 ENCOUNTER — Encounter: Payer: Self-pay | Admitting: Nurse Practitioner

## 2025-01-02 VITALS — BP 138/84 | HR 101 | Temp 98.1°F | Resp 17 | Ht 62.01 in | Wt 174.0 lb

## 2025-01-02 DIAGNOSIS — I10 Essential (primary) hypertension: Secondary | ICD-10-CM

## 2025-01-02 DIAGNOSIS — E66811 Obesity, class 1: Secondary | ICD-10-CM

## 2025-01-02 DIAGNOSIS — E6609 Other obesity due to excess calories: Secondary | ICD-10-CM

## 2025-01-02 DIAGNOSIS — Z6831 Body mass index (BMI) 31.0-31.9, adult: Secondary | ICD-10-CM

## 2025-01-02 MED ORDER — ZEPBOUND 2.5 MG/0.5ML ~~LOC~~ SOAJ: 2.5000 mg | SUBCUTANEOUS | 3 refills | Status: AC

## 2025-01-02 NOTE — Assessment & Plan Note (Signed)
 Chronic, ongoing. Remains above goal on recheck, but trend down. She refuses to take any medication, even if needed. Although wants to look into GLP1 for weight loss.  Will continue diet focus and recommend regular exercise 30 minutes 5 days a week.  Recommend complete cessation of smoking.  Recommend she monitor BP at least a few mornings a week at home and document.  DASH diet at home.  Labs today: up to date.

## 2025-01-02 NOTE — Progress Notes (Signed)
 "  BP 138/84 (BP Location: Left Arm, Patient Position: Sitting, Cuff Size: Large)   Pulse (!) 101   Temp 98.1 F (36.7 C) (Oral)   Resp 17   Ht 5' 2.01 (1.575 m)   Wt 174 lb (78.9 kg)   SpO2 96%   BMI 31.82 kg/m    Subjective:    Patient ID: Hailey Patterson, female    DOB: July 28, 1956, 69 y.o.   MRN: [id number]  HPI: Hailey Patterson is a 69 y.o. female  Chief Complaint  Patient presents with   Weight Loss    Here to talk about weight loss shot. Patient states that she talked to the [employer] and they said they do cover it.   WEIGHT GAIN She reports that insurance will cover weight loss shots. No family history of thyroid  cancer (MTC, MEN 2, thyroid  cell tumors) or pancreatitis.  She has tried for >6 months to lose weight via healthy diet changes and regular exercise, 30 minutes 5 days a week, and not been able to lose. Duration: chronic Previous attempts at weight loss: yes Complications of obesity: HTN Peak weight: 175 lbs Weight loss goal: 130 lbs Weight loss to date: 0 pounds Requesting obesity pharmacotherapy: yes Current weight loss supplements/medications: no Previous weight loss supplements/meds: no Calories:  2000  Clinical coverage for weight loss GLP's   Medication being dispensed is Zepbound  2 mL/28 days. Titration doses are 2 mL/28 days.   [x]  Product being prescribed is FDA approved for the indication, age, weight (if applicable) and not does not exceed dosing limits per the Prescribing Information per the clinical conditions for use.  [x]  Patient's baseline weight measured within the last 45 days as required by provider before dispensing.  [x]  Patient is new to therapy and One of the following:   [x]  The beneficiary is 71 years of age or over and has ONE of the following:  [x]  A BMI greater than or equal to 30 kg/m2  []  A BMI greater than or equal to 27 kg/m2 with at least one weight-related comorbidity/risk factor/complication (i.e. hypertension,  type 2 diabetes, obstructive sleep apnea, cardiovascular disease, dyslipidemia)   If patient has one weight-related comorbidity/risk factor/complication (i.e. hypertension, type 2 diabetes, obstructive sleep apnea, cardiovascular disease, dyslipidemia), please list: hypertension and BMI >30. Patient suffers from weight-related comorbidity/risk factor/complication hypertension and BMI >30.   [x]  The beneficiary has participated in 6 months or greater of lifestyle modification and will continue while on medication including structured nutrition following provider recommend dietary modifications and physical activity, unless physical activity is not clinically appropriate at the time GLP1 therapy commences AND  [x]  The beneficiary will NOT be using the requested agent in combination with another GLP-1 receptor agonist agent AND  [x]  The beneficiary does NOT have any FDA-labeled contraindications to the requested agent, including pregnancy, lactation, history of medullary thyroid  cancer or multiple endocrine neoplasia type II.   Last BMI/Weight/Height recorded Estimated body mass index is 31.82 kg/m as calculated from the following:   Height as of this encounter: 5' 2.01 (1.575 m).   Weight as of this encounter: 174 lb (78.9 kg).  Relevant past medical, surgical, family and social history reviewed and updated as indicated. Interim medical history since our last visit reviewed. Allergies and medications reviewed and updated.  Review of Systems  Constitutional:  Negative for activity change, appetite change, diaphoresis, fatigue and fever.  Respiratory:  Negative for cough, chest tightness and shortness of breath.   Cardiovascular:  Negative for  chest pain, palpitations and leg swelling.  Gastrointestinal: Negative.   Neurological: Negative.   Psychiatric/Behavioral: Negative.      Per HPI unless specifically indicated above     Objective:    BP 138/84 (BP Location: Left Arm, Patient  Position: Sitting, Cuff Size: Large)   Pulse (!) 101   Temp 98.1 F (36.7 C) (Oral)   Resp 17   Ht 5' 2.01 (1.575 m)   Wt 174 lb (78.9 kg)   SpO2 96%   BMI 31.82 kg/m   Wt Readings from Last 3 Encounters:  01/02/25 174 lb (78.9 kg)  09/21/24 174 lb (78.9 kg)  02/09/24 160 lb (72.6 kg)    Physical Exam Vitals and nursing note reviewed.  Constitutional:      General: She is awake. She is not in acute distress.    Appearance: She is well-developed and well-groomed. She is obese. She is not ill-appearing or toxic-appearing.  HENT:     Head: Normocephalic.     Right Ear: Hearing and external ear normal.     Left Ear: Hearing and external ear normal.  Eyes:     General: Lids are normal.        Right eye: No discharge.        Left eye: No discharge.     Conjunctiva/sclera: Conjunctivae normal.     Pupils: Pupils are equal, round, and reactive to light.  Neck:     Thyroid : No thyromegaly.     Vascular: No carotid bruit.  Cardiovascular:     Rate and Rhythm: Normal rate and regular rhythm.     Heart sounds: Normal heart sounds. No murmur heard.    No gallop.  Pulmonary:     Effort: Pulmonary effort is normal. No accessory muscle usage or respiratory distress.     Breath sounds: Normal breath sounds. No decreased breath sounds, wheezing or rales.  Abdominal:     General: Bowel sounds are normal. There is no distension.     Palpations: Abdomen is soft.     Tenderness: There is no abdominal tenderness.  Musculoskeletal:     Cervical back: Normal range of motion and neck supple.     Right lower leg: No edema.     Left lower leg: No edema.  Lymphadenopathy:     Cervical: No cervical adenopathy.  Skin:    General: Skin is warm and dry.  Neurological:     Mental Status: She is alert and oriented to person, place, and time.     Deep Tendon Reflexes: Reflexes are normal and symmetric.     Reflex Scores:      Brachioradialis reflexes are 2+ on the right side and 2+ on the left  side.      Patellar reflexes are 2+ on the right side and 2+ on the left side. Psychiatric:        Attention and Perception: Attention normal.        Mood and Affect: Mood normal.        Speech: Speech normal.        Behavior: Behavior normal. Behavior is cooperative.        Thought Content: Thought content normal.     Results for orders placed or performed in visit on 02/17/24  Bayer DCA Hb A1c Waived   Collection Time: 02/17/24 11:32 AM  Result Value Ref Range   HB A1C (BAYER DCA - WAIVED) 5.6 4.8 - 5.6 %  Microalbumin, Urine Waived   Collection Time: 02/17/24  11:32 AM  Result Value Ref Range   Microalb, Ur Waived 80 (H) 0 - 19 mg/L   Creatinine, Urine Waived 200 10 - 300 mg/dL   Microalb/Creat Ratio 30-300 (H) <30 mg/g  CBC with Differential/Platelet   Collection Time: 02/17/24 11:33 AM  Result Value Ref Range   WBC 5.4 3.4 - 10.8 x10E3/uL   RBC 4.42 3.77 - 5.28 x10E6/uL   Hemoglobin 14.2 11.1 - 15.9 g/dL   Hematocrit 57.4 65.9 - 46.6 %   MCV 96 79 - 97 fL   MCH 32.1 26.6 - 33.0 pg   MCHC 33.4 31.5 - 35.7 g/dL   RDW 85.8 88.2 - 84.5 %   Platelets 233 150 - 450 x10E3/uL   Neutrophils 55 Not Estab. %   Lymphs 38 Not Estab. %   Monocytes 6 Not Estab. %   Eos 1 Not Estab. %   Basos 0 Not Estab. %   Neutrophils Absolute 2.9 1.4 - 7.0 x10E3/uL   Lymphocytes Absolute 2.0 0.7 - 3.1 x10E3/uL   Monocytes Absolute 0.3 0.1 - 0.9 x10E3/uL   EOS (ABSOLUTE) 0.1 0.0 - 0.4 x10E3/uL   Basophils Absolute 0.0 0.0 - 0.2 x10E3/uL   Immature Granulocytes 0 Not Estab. %   Immature Grans (Abs) 0.0 0.0 - 0.1 x10E3/uL  Comprehensive metabolic panel   Collection Time: 02/17/24 11:33 AM  Result Value Ref Range   Glucose 124 (H) 70 - 99 mg/dL   BUN 15 8 - 27 mg/dL   Creatinine, Ser 8.94 (H) 0.57 - 1.00 mg/dL   eGFR 58 (L) >40 fO/fpw/8.26   BUN/Creatinine Ratio 14 12 - 28   Sodium 140 134 - 144 mmol/L   Potassium 4.2 3.5 - 5.2 mmol/L   Chloride 104 96 - 106 mmol/L   CO2 22 20 - 29 mmol/L    Calcium 9.2 8.7 - 10.3 mg/dL   Total Protein 6.4 6.0 - 8.5 g/dL   Albumin 4.2 3.9 - 4.9 g/dL   Globulin, Total 2.2 1.5 - 4.5 g/dL   Bilirubin Total 0.3 0.0 - 1.2 mg/dL   Alkaline Phosphatase 75 44 - 121 IU/L   AST 20 0 - 40 IU/L   ALT 17 0 - 32 IU/L  Lipid Panel w/o Chol/HDL Ratio   Collection Time: 02/17/24 11:33 AM  Result Value Ref Range   Cholesterol, Total 188 100 - 199 mg/dL   Triglycerides 888 0 - 149 mg/dL   HDL 44 >60 mg/dL   VLDL Cholesterol Cal 20 5 - 40 mg/dL   LDL Chol Calc (NIH) 875 (H) 0 - 99 mg/dL  TSH   Collection Time: 02/17/24 11:33 AM  Result Value Ref Range   TSH 2.660 0.450 - 4.500 uIU/mL  VITAMIN D  25 Hydroxy (Vit-D Deficiency, Fractures)   Collection Time: 02/17/24 11:33 AM  Result Value Ref Range   Vit D, 25-Hydroxy 27.9 (L) 30.0 - 100.0 ng/mL      Assessment & Plan:   Problem List Items Addressed This Visit       Cardiovascular and Mediastinum   Primary hypertension   Chronic, ongoing. Remains above goal on recheck, but trend down. She refuses to take any medication, even if needed. Although wants to look into GLP1 for weight loss.  Will continue diet focus and recommend regular exercise 30 minutes 5 days a week.  Recommend complete cessation of smoking.  Recommend she monitor BP at least a few mornings a week at home and document.  DASH diet at home.  Labs  today: up to date.         Other   Obesity - Primary   BMI 31.82 with HTN and IFG. She has tried for 6 months to lose weight via healthy diet and regular exercise with no loss present. She is interested in starting GLP1 and reports her insurance told her they would cover for weight loss. No family history of thyroid  cancer (MTC, MEN 2, thyroid  cell tumors) or pancreatitis.   Will attempt to get coverage for Zepbound  which she would prefer as has two family members who take it and have lost a good amount of weight. Script sent in, alerted her it may take time to see if approved. If approved will  see her back in March and increase dose. Recommended eating smaller high protein, low fat meals more frequently and exercising 30 mins a day 5 times a week with a goal of 10-15lb weight loss in the next 3 months. Patient voiced their understanding and motivation to adhere to these recommendations.       Relevant Medications   tirzepatide  (ZEPBOUND ) 2.5 MG/0.5ML Pen     Follow up plan: Return for as scheduled March 9th for follow-up.      "

## 2025-01-02 NOTE — Assessment & Plan Note (Signed)
 BMI 31.82 with HTN and IFG. She has tried for 6 months to lose weight via healthy diet and regular exercise with no loss present. She is interested in starting GLP1 and reports her insurance told her they would cover for weight loss. No family history of thyroid  cancer (MTC, MEN 2, thyroid  cell tumors) or pancreatitis.   Will attempt to get coverage for Zepbound  which she would prefer as has two family members who take it and have lost a good amount of weight. Script sent in, alerted her it may take time to see if approved. If approved will see her back in March and increase dose. Recommended eating smaller high protein, low fat meals more frequently and exercising 30 mins a day 5 times a week with a goal of 10-15lb weight loss in the next 3 months. Patient voiced their understanding and motivation to adhere to these recommendations.

## 2025-01-03 ENCOUNTER — Encounter: Payer: Self-pay | Admitting: Nurse Practitioner

## 2025-01-24 ENCOUNTER — Ambulatory Visit
Admit: 2025-01-24 | Discharge: 2025-01-24 | Disposition: A | Attending: Emergency Medicine | Admitting: Emergency Medicine

## 2025-01-24 ENCOUNTER — Ambulatory Visit
Admission: EM | Admit: 2025-01-24 | Discharge: 2025-01-24 | Disposition: A | Discharge status: Home / Self Care | Source: Home / Self Care

## 2025-01-24 DIAGNOSIS — M25421 Effusion, right elbow: Secondary | ICD-10-CM

## 2025-01-24 DIAGNOSIS — I809 Phlebitis and thrombophlebitis of unspecified site: Secondary | ICD-10-CM

## 2025-01-24 MED ORDER — IBUPROFEN 600 MG PO TABS: 600.0000 mg | ORAL_TABLET | Freq: Four times a day (QID) | ORAL | 0 refills | Status: AC | PRN

## 2025-01-24 NOTE — ED Triage Notes (Signed)
 Here today for right arm pain and burning at [facility]. Pain woke pt. up last night.  No injury to site.

## 2025-01-24 NOTE — Discharge Instructions (Addendum)
 Your ultrasound did not show any evidence of a blood clot.  Given that you are tender over your vein I do feel that it is inflamed.  You may apply warm or cool compresses to the crook of your elbow for 20 minutes at a time, several times a day, to help with pain and inflammation.  Take the 600 mg ibuprofen  every 6 hours with food to help with pain and inflammation.  You may use this as needed.  If you develop any increased pain, swelling, or develop redness or red streaks going up your arm either return for reevaluation or seek care in the ER.

## 2025-01-26 ENCOUNTER — Other Ambulatory Visit (HOSPITAL_COMMUNITY): Payer: Self-pay

## 2025-02-14 ENCOUNTER — Ambulatory Visit: Payer: 59

## 2025-02-27 ENCOUNTER — Encounter: Admitting: Nurse Practitioner

## 2025-03-07 ENCOUNTER — Encounter: Admitting: Nurse Practitioner
